# Patient Record
Sex: Female | Born: 1950
Health system: Southern US, Community
[De-identification: ages and names within clinical notes are randomized; demographics above are authoritative.]

## PROBLEM LIST (undated history)

## (undated) DIAGNOSIS — R51 Headache: Secondary | ICD-10-CM

## (undated) DIAGNOSIS — I872 Venous insufficiency (chronic) (peripheral): Secondary | ICD-10-CM

## (undated) DIAGNOSIS — C801 Malignant (primary) neoplasm, unspecified: Secondary | ICD-10-CM

## (undated) DIAGNOSIS — E039 Hypothyroidism, unspecified: Secondary | ICD-10-CM

## (undated) DIAGNOSIS — G4730 Sleep apnea, unspecified: Secondary | ICD-10-CM

## (undated) DIAGNOSIS — K5900 Constipation, unspecified: Secondary | ICD-10-CM

## (undated) DIAGNOSIS — G43909 Migraine, unspecified, not intractable, without status migrainosus: Secondary | ICD-10-CM

## (undated) HISTORY — DX: Constipation, unspecified: K59.00

## (undated) HISTORY — PX: WISDOM TOOTH EXTRACTION: SHX21

## (undated) HISTORY — DX: Sleep apnea, unspecified: G47.30

## (undated) HISTORY — PX: TUBAL LIGATION: SHX77

## (undated) HISTORY — PX: APPENDECTOMY: SHX54

## (undated) HISTORY — PX: KNEE ARTHROSCOPY: SUR90

## (undated) HISTORY — DX: Migraine, unspecified, not intractable, without status migrainosus: G43.909

## (undated) HISTORY — PX: ABDOMINAL HYSTERECTOMY: SHX81

---

## 1999-03-11 ENCOUNTER — Other Ambulatory Visit: Admission: RE | Admit: 1999-03-11 | Discharge: 1999-03-11 | Payer: Self-pay | Admitting: Gynecology

## 2000-03-21 ENCOUNTER — Other Ambulatory Visit: Admission: RE | Admit: 2000-03-21 | Discharge: 2000-03-21 | Payer: Self-pay | Admitting: Gynecology

## 2000-09-19 ENCOUNTER — Other Ambulatory Visit: Admission: RE | Admit: 2000-09-19 | Discharge: 2000-09-19 | Payer: Self-pay | Admitting: Gynecology

## 2001-03-20 ENCOUNTER — Other Ambulatory Visit: Admission: RE | Admit: 2001-03-20 | Discharge: 2001-03-20 | Payer: Self-pay | Admitting: Gynecology

## 2001-11-01 ENCOUNTER — Other Ambulatory Visit: Admission: RE | Admit: 2001-11-01 | Discharge: 2001-11-01 | Payer: Self-pay | Admitting: *Deleted

## 2004-03-12 ENCOUNTER — Other Ambulatory Visit: Admission: RE | Admit: 2004-03-12 | Discharge: 2004-03-12 | Payer: Self-pay | Admitting: Obstetrics and Gynecology

## 2005-03-15 ENCOUNTER — Other Ambulatory Visit: Admission: RE | Admit: 2005-03-15 | Discharge: 2005-03-15 | Payer: Self-pay | Admitting: Obstetrics and Gynecology

## 2007-05-10 ENCOUNTER — Encounter (INDEPENDENT_AMBULATORY_CARE_PROVIDER_SITE_OTHER): Payer: Self-pay | Admitting: General Surgery

## 2007-05-10 ENCOUNTER — Observation Stay (HOSPITAL_COMMUNITY): Admission: EM | Admit: 2007-05-10 | Discharge: 2007-05-10 | Payer: Self-pay | Admitting: Emergency Medicine

## 2010-11-22 ENCOUNTER — Inpatient Hospital Stay (INDEPENDENT_AMBULATORY_CARE_PROVIDER_SITE_OTHER)
Admission: RE | Admit: 2010-11-22 | Discharge: 2010-11-22 | Disposition: A | Discharge status: Home / Self Care | Payer: BC Managed Care – PPO | Source: Ambulatory Visit | Attending: Family Medicine | Admitting: Family Medicine

## 2010-11-22 DIAGNOSIS — M549 Dorsalgia, unspecified: Secondary | ICD-10-CM

## 2010-11-22 LAB — POCT URINALYSIS DIPSTICK
Bilirubin Urine: NEGATIVE
Nitrite: NEGATIVE
Protein, ur: NEGATIVE mg/dL
pH: 6 (ref 5.0–8.0)

## 2011-02-22 NOTE — Op Note (Signed)
NAMECHRISTOL, Mandy Holmes             ACCOUNT NO.:  [id number]   MEDICAL RECORD NO.:  [id number]          PATIENT TYPE:  OBV   LOCATION:  0005                         FACILITY:  [facility] [place]   PHYSICIAN:  Angelia Mould. Derrell Lolling, M.D.DATE OF BIRTH:  05-May-1951   DATE OF PROCEDURE:  05/10/2007  DATE OF DISCHARGE:                               OPERATIVE REPORT   PREOPERATIVE DIAGNOSIS:  Acute appendicitis.   POSTOPERATIVE DIAGNOSIS:  Acute appendicitis.   OPERATION PERFORMED:  Laparoscopic appendectomy.   SURGEON:  Angelia Mould. Derrell Lolling, M.D.   OPERATIVE INDICATIONS:  This is a 60 year old white female in reasonably  good health.  She presented with a 15-hour history of progressive right  lower quadrant pain, nausea and vomiting.  Examination reveals localized  tenderness in the right lower quadrant.  CT scan shows an inflamed  appendix, but no abscess.  She is brought to the operating room  emergently.   OPERATIVE FINDINGS:  The appendix was acutely inflamed, and there was  some exudate on the appendix, but there was no sign of any gangrene or  rupture.  The terminal ileum, cecum, and right colon looked normal.   OPERATIVE TECHNIQUE:  Following the induction of general endotracheal  anesthesia.  The patient's abdomen was prepped and draped in a sterile  fashion.  The patient was identified as the correct patient, correct  procedure, and intravenous antibiotics were given prior to the incision.  Then 0.5% Marcaine with epinephrine was used as a local infiltration  anesthetic.  An 11-mm OptiVu port was placed in the left rectus sheath  above the umbilicus.  This was atraumatic.  '   Pneumoperitoneum was created.  A 5-mm trocar was placed in the left  rectus sheath below the umbilicus; and an 11-mm trocar was placed in the  suprapubic area.  We ran the small bowel until we could find the  ileocecal valve.  We identified the appendix and elevated it.  We used  the harmonic scalpel to slowly take down  and divide the appendiceal  mesentery, and the appendiceal artery.  Once we had appendix completely  skeletonized.  We could see where the appendix inserted into the cecum.  At that point, the appendix was healthy.  We transected the appendix  with an Endo-GIA stapling device, placed it in a specimen bag and  removed it.   We irrigated the operative field.  There had been a little bit of oozing  from the area around the staple line that stopped; and the irrigation  fluid was completely clear.  The staple line appeared secure.  Pneumoperitoneum was  released.  Trocars were removed.  Skin incisions were closed with  subcuticular sutures of 4-0 Monocryl and Steri-Strips.  Clean bandages  were placed; the patient taken to the recovery in stable condition.  Estimated blood loss was about 20 mL.  Complications none.  Sponge,  needle and instruments counts were correct.      Angelia Mould. Derrell Lolling, M.D.  Electronically Signed     HMI/MEDQ  D:  05/10/2007  T:  05/10/2007  Job:  [id number]

## 2011-02-22 NOTE — H&P (Signed)
Mandy Holmes, Mandy Holmes             ACCOUNT NO.:  [id number]   MEDICAL RECORD NO.:  [id number]          PATIENT TYPE:  OBV   LOCATION:  0005                         FACILITY:  [facility]   PHYSICIAN:  Angelia Mould. Derrell Lolling, M.D.DATE OF BIRTH:  Dec 17, 1950   DATE OF ADMISSION:  05/10/2007  DATE OF DISCHARGE:                              HISTORY & PHYSICAL   CHIEF COMPLAINT:  Abdominal pain.   HISTORY OF PRESENT ILLNESS:  This is a 60 year old white female who  developed abdominal pain at noon yesterday, approximately 15 hours ago.  The pain has been progressive and is more localized to the right lower  quadrant now.  She has vomited several times.  Stools have been a little  softer but no diarrhea, no blood in the stools.  She denies fever or  chills.  She came to emergency room and a CT scan shows an inflamed  appendix but no evidence of rupture or abscess.  I was called to see  her.   PAST HISTORY:  Knee arthroscopies.  Bilateral tubal ligation.   MEDICATIONS:  Hormone-replacement therapy.   DRUG ALLERGIES:  None known.   FAMILY HISTORY:  Mother living, has had a hysterectomy for uterine  cancer, has had a cholecystectomy.  Father living, has diabetes.  Two  brothers, one with skin cancer.  Two sisters, one with diabetes.   SOCIAL HISTORY:  She is married.  They have two children.  Her husband  is with her tonight.  They live in [place].  Denies tobacco.  Drinks alcohol occasionally.  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   REVIEW OF SYSTEMS:  Fifteen-system review of systems is performed and is  noncontributory except as described above.   PHYSICAL EXAMINATION:  Pleasant middle-aged woman in mild distress.  Temperature 98.0, pulse 85, respirations 18, blood pressure 117/63.  HEENT:  Eyes:  Sclerae clear.  Extraocular movements intact.  Ears,  nose, mouth and throat, nose, lips, tongue and oropharynx are without  gross lesions.  NECK:   Supple, nontender.  No mass.  No jugular distension.  LUNGS:  Clear to auscultation.  No chest wall tenderness.  HEART:  Regular rate and rhythm.  No murmur.  Radial and femoral pulses  are palpable.  BREASTS:  Not examined.  ABDOMEN:  Borderline obese.  Soft.  Hypoactive bowel sounds.  She has  localized tenderness and significant involuntary guarding in the right  lower quadrant.  I do not feel a mass.  There are no scars or hernias.  EXTREMITIES:  She moves all four extremities well without pain or  deformity.  NEUROLOGIC:  No gross motor or sensory deficits.   ADMISSION DATA:  CT scan described above showing acute appendicitis.  White blood cell count 14,600; hemoglobin 12.8.   IMPRESSION:  Acute appendicitis.   PLAN:  The patient will need to be taken to operating room for a  laparoscopic appendectomy.   I have discussed the indication and details of surgery with the patient  and her husband.  Risks and complications have been outlined, including  but not limited to  bleeding, infection, conversion to open laparotomy,  injury to adjacent organs such as major vascular structure or the  intestine with major reconstructive surgery, differential diagnosis  discussed.  Cardiac, pulmonary and thromboembolic complications have  also been discussed.  She understands these issues well.  At this time  all of her questions are answered.  She is in complete agreement with  this plan.      Angelia Mould. Derrell Lolling, M.D.  Electronically Signed     HMI/MEDQ  D:  05/10/2007  T:  05/10/2007  Job:  [id number]

## 2011-03-31 ENCOUNTER — Other Ambulatory Visit: Payer: Self-pay | Admitting: Gynecology

## 2011-07-25 LAB — COMPREHENSIVE METABOLIC PANEL
ALT: 24
AST: 21
Alkaline Phosphatase: 75
CO2: 25
GFR calc Af Amer: >60
GFR calc non Af Amer: >60
Glucose, Bld: 157 — ABNORMAL HIGH
Potassium: 3.9
Sodium: 135

## 2011-07-25 LAB — CBC
Hemoglobin: 12.8
RBC: 4.14
WBC: 14.6 — ABNORMAL HIGH

## 2011-07-25 LAB — BUN: BUN: 11

## 2011-07-25 LAB — DIFFERENTIAL
Basophils Relative: 0
Eosinophils Absolute: 0
Eosinophils Relative: 0
Monocytes Relative: 5
Neutrophils Relative %: 91 — ABNORMAL HIGH

## 2011-07-25 LAB — CREATININE, SERUM: Creatinine, Ser: 0.53

## 2011-07-25 LAB — URINALYSIS, ROUTINE W REFLEX MICROSCOPIC
Hgb urine dipstick: NEGATIVE
Ketones, ur: NEGATIVE
Protein, ur: NEGATIVE
Urobilinogen, UA: 0.2

## 2011-08-01 ENCOUNTER — Inpatient Hospital Stay (INDEPENDENT_AMBULATORY_CARE_PROVIDER_SITE_OTHER)
Admission: RE | Admit: 2011-08-01 | Discharge: 2011-08-01 | Disposition: A | Discharge status: Home / Self Care | Payer: 59 | Source: Ambulatory Visit | Attending: Emergency Medicine | Admitting: Emergency Medicine

## 2011-08-01 DIAGNOSIS — J019 Acute sinusitis, unspecified: Secondary | ICD-10-CM

## 2011-12-28 ENCOUNTER — Other Ambulatory Visit: Payer: Self-pay | Admitting: Family Medicine

## 2011-12-28 DIAGNOSIS — E041 Nontoxic single thyroid nodule: Secondary | ICD-10-CM

## 2011-12-29 ENCOUNTER — Ambulatory Visit
Admission: RE | Admit: 2011-12-29 | Discharge: 2011-12-29 | Disposition: A | Discharge status: Home / Self Care | Payer: 59 | Source: Ambulatory Visit | Attending: Family Medicine | Admitting: Family Medicine

## 2011-12-29 DIAGNOSIS — E041 Nontoxic single thyroid nodule: Secondary | ICD-10-CM

## 2012-01-04 ENCOUNTER — Other Ambulatory Visit: Payer: Self-pay | Admitting: Family Medicine

## 2012-01-04 DIAGNOSIS — E041 Nontoxic single thyroid nodule: Secondary | ICD-10-CM

## 2012-01-10 ENCOUNTER — Other Ambulatory Visit: Payer: 59

## 2012-01-11 ENCOUNTER — Other Ambulatory Visit (HOSPITAL_COMMUNITY)
Admission: RE | Admit: 2012-01-11 | Discharge: 2012-01-11 | Disposition: A | Discharge status: Home / Self Care | Payer: 59 | Source: Ambulatory Visit | Attending: Interventional Radiology | Admitting: Interventional Radiology

## 2012-01-11 ENCOUNTER — Ambulatory Visit
Admission: RE | Admit: 2012-01-11 | Discharge: 2012-01-11 | Disposition: A | Discharge status: Home / Self Care | Payer: 59 | Source: Ambulatory Visit | Attending: Family Medicine | Admitting: Family Medicine

## 2012-01-11 DIAGNOSIS — E049 Nontoxic goiter, unspecified: Secondary | ICD-10-CM | POA: Insufficient documentation

## 2012-01-11 DIAGNOSIS — E041 Nontoxic single thyroid nodule: Secondary | ICD-10-CM

## 2013-04-10 ENCOUNTER — Other Ambulatory Visit: Payer: Self-pay | Admitting: Obstetrics and Gynecology

## 2013-07-03 ENCOUNTER — Other Ambulatory Visit: Payer: Self-pay | Admitting: Family Medicine

## 2013-07-03 DIAGNOSIS — E041 Nontoxic single thyroid nodule: Secondary | ICD-10-CM

## 2013-07-08 ENCOUNTER — Other Ambulatory Visit: Payer: Self-pay

## 2013-07-08 DIAGNOSIS — Z1231 Encounter for screening mammogram for malignant neoplasm of breast: Secondary | ICD-10-CM

## 2013-07-12 ENCOUNTER — Ambulatory Visit
Admission: RE | Admit: 2013-07-12 | Discharge: 2013-07-12 | Disposition: A | Discharge status: Home / Self Care | Payer: 59 | Source: Ambulatory Visit

## 2013-07-12 DIAGNOSIS — Z1231 Encounter for screening mammogram for malignant neoplasm of breast: Secondary | ICD-10-CM

## 2013-10-23 ENCOUNTER — Telehealth: Payer: Self-pay | Admitting: *Deleted

## 2013-10-23 NOTE — Telephone Encounter (Signed)
Call from Dr. Gus Height with pt referral. Requested pt to be seen at next available appt. Appt given for pt to be seen by Dr. Skeet Latch on 11/07/13 at [place]. Call to HIM for pt records to be faxed.

## 2013-11-07 ENCOUNTER — Ambulatory Visit: Payer: 59 | Attending: Gynecologic Oncology | Admitting: Gynecologic Oncology

## 2013-11-07 ENCOUNTER — Encounter (INDEPENDENT_AMBULATORY_CARE_PROVIDER_SITE_OTHER): Payer: Self-pay

## 2013-11-07 VITALS — BP 123/91 | HR 83 | Temp 98.6°F | Resp 16 | Ht 65.0 in | Wt 182.7 lb

## 2013-11-07 DIAGNOSIS — Z7982 Long term (current) use of aspirin: Secondary | ICD-10-CM | POA: Insufficient documentation

## 2013-11-07 DIAGNOSIS — Z8542 Personal history of malignant neoplasm of other parts of uterus: Secondary | ICD-10-CM | POA: Insufficient documentation

## 2013-11-07 DIAGNOSIS — E041 Nontoxic single thyroid nodule: Secondary | ICD-10-CM | POA: Insufficient documentation

## 2013-11-07 DIAGNOSIS — G43909 Migraine, unspecified, not intractable, without status migrainosus: Secondary | ICD-10-CM | POA: Insufficient documentation

## 2013-11-07 DIAGNOSIS — Z79899 Other long term (current) drug therapy: Secondary | ICD-10-CM | POA: Insufficient documentation

## 2013-11-07 DIAGNOSIS — C549 Malignant neoplasm of corpus uteri, unspecified: Secondary | ICD-10-CM | POA: Insufficient documentation

## 2013-11-07 DIAGNOSIS — C55 Malignant neoplasm of uterus, part unspecified: Secondary | ICD-10-CM

## 2013-11-07 DIAGNOSIS — C541 Malignant neoplasm of endometrium: Secondary | ICD-10-CM

## 2013-11-07 MED ORDER — HYDROCODONE-ACETAMINOPHEN 5-325 MG PO TABS: 1.0000 | ORAL_TABLET | Freq: Four times a day (QID) | ORAL | Status: DC | PRN

## 2013-11-07 NOTE — Progress Notes (Signed)
Pt advised she has between 13-15 headaches a month and takes a BC powder 845 mg when she feels a headache coming on. Discussed with pt she will need to refrain from taking BC powder or blood thinners 10days prior to surgery on 2/24. NP spoke to pt,reviewed effects of taking blood thinners prior to surgery.Pt verbalized understanding Rx for Pt plans to have surgery on 2/24 at [facility]

## 2013-11-07 NOTE — Progress Notes (Signed)
Consult Note: Gyn-Onc  Consult was requested by Dr. Harrington Holmes for the evaluation of Mandy Holmes 63 y.o. female  CC:  Chief Complaint  Patient presents with  . endometrial cancer    Assessment/Plan:  [name]  is a 63 y.o. with FIGO grade 1 endometrial adenocarcinoma.  A detailed discussion was held with the patient with regard to to her endometrial cancer diagnosis. We discussed the standard management options for uterine cancer which includes surgery followed possibly by adjuvant therapy depending on the results of surgery. The options for surgical management include a hysterectomy and removal of the tubes and ovaries possibly with removal of pelvic and para-aortic lymph nodes. A minimally invasive approach including a robotic hysterectomy or laparoscopic hysterectomy have benefits including shorter hospital stay, recovery time and better wound healing. The alternative approach is an open hysterectomy. The patient has been counseled about these surgical options and the risks of surgery in general including infection, bleeding, damage to surrounding structures (including bowel, bladder, ureters, nerves or vessels).   I extensively reviewed the additional risks of robotic hysterectomy including possible need for conversion to open laparotomy.  I discussed positioning during surgery of trendelenberg and risks of minor facial swelling and care we take in preoperative positioning.  After counseling and consideration of her options, she desires to proceed with a minimally invasive endometrial cancer staging procedure on December 03, 2013 with Dr. Nancy Holmes.  She has been advised to discontinue the use of BC powder 10 days prior to surgery.  She will be seen by anesthesia for preoperative clearance and discussion of postoperative pain management.  She was given the opportunity to ask questions, which were answered to her satisfaction, and she is agreement with the above mentioned plan of  care.  HPI: [name] is a 63 y.o. LNMP 10 years he has been on Vivelle-Dot and progesterone for hormonal therapy.  She reports bleeding 01/2012.  Evaluation at that time was notable for benign weakly proliferative endometrium no atypia or hyperplasia.  Mandy Holmes did well until 9 months ago when she noted irregular bleeding.  She presented for evaluation and an endometrial biopsy  10/22/2013 was notable for endometrial adenocarcinoma FIGO grade 1 and a benign endocervical polyp.  The uterus sounded to 7 cm.  Mandy Holmes reports less bleeding since the procedure.  She denies weight loss is no nausea vomiting no changes in bowel or bladder habits no abdominal distention.  Her family history is notable for mother diagnosed with uterine  cancer at age 7 and a brother with melanoma.     Current Meds:  Outpatient Encounter Prescriptions as of 11/07/2013  Medication Sig  . Aspirin-Salicylamide-Caffeine (BC HEADACHE POWDER PO) Take by mouth.  Marland Kitchen BIOTIN 5000 PO Take 10,000 mcg by mouth.  . calcium carbonate (TUMS - DOSED IN MG ELEMENTAL CALCIUM) 500 MG chewable tablet Chew 1 tablet by mouth daily.  . cyanocobalamin 100 MCG tablet Take 1,000 mcg by mouth daily.  Marland Kitchen EVENING PRIMROSE OIL PO Take 1 each by mouth daily.  . IODINE, KELP, PO Take by mouth.  Marland Kitchen MAGNESIUM GLYCINATE PLUS PO Take 1 each by mouth daily.  . Multiple Vitamin (MULTIVITAMIN) tablet Take 1 tablet by mouth daily.  . Omega-3 Fatty Acids (FISH OIL PO) Take by mouth.  Marland Kitchen PROGESTERONE IM Inject into the muscle. 1 x week  . vitamin C (ASCORBIC ACID) 500 MG tablet Take 3,000 mg by mouth daily.  . Vitamin D, Ergocalciferol, (DRISDOL)  50000 UNITS CAPS capsule Take 50,000 Units by mouth every 7 (seven) days.  Marland Kitchen HYDROcodone-acetaminophen (NORCO/VICODIN) 5-325 MG per tablet Take 1 tablet by mouth every 6 (six) hours as needed for moderate pain.    Allergy: No Known Allergies  Social Hx:   History   Social History  . Marital  Status: Married    Spouse Name: N/A    Number of Children: N/A  . Years of Education: N/A   Occupational History  . Not on file.   Social History Main Topics  . Smoking status: Not on file  . Smokeless tobacco: Not on file  . Alcohol Use: Not on file  . Drug Use: Not on file  . Sexual Activity: Not on file   Other Topics Concern  . Not on file   Social History Narrative  . No narrative on file    Past Surgical Hx: No past surgical history on file.  Past Medical Hx: No past medical history on file. Migraines  Cold thyroid nodule  Past Gynecological History:Menarche 11/regular/3days LNMP 10 years ago.  HT since menopause  Vivelle dot and progesteronex5/week. No h/o abnormal pap.  Tubal 1981  Family Hx: No family history on file. Mother uterine cancer dx 84 Brother melanoma  Review of Systems:  Constitutional  Feels well,   Cardiovascular  No chest pain, shortness of breath, or edema  Pulmonary  No cough or wheeze.  Gastro Intestinal  No nausea, vomitting, or diarrhoea. No bright red blood per rectum, no abdominal pain, change in bowel movement, or constipation. No weight loss, no nausea vomiting or early satiety. Genito Urinary  No frequency, urgency, dysuria, intermittent spotting. Musculo Skeletal  No myalgia, arthralgia, joint swelling or pain  Neurologic  No weakness, numbness, change in gait,  Psychology  No depression, anxiety, insomnia.   Vitals:  Blood pressure 123/91, pulse 83, temperature 98.6 F (37 C), temperature source Oral, resp. rate 16, height 5\' 5"  (1.651 m), weight 182 lb 11.2 oz (82.872 kg). Body mass index is 30.4 kg/(m^2).  Physical Exam: WD in NAD Neck  Supple NROM, without any enlargements.  Lymph Node Survey No cervical supraclavicular or inguinal adenopathy Cardiovascular  Pulse normal rate, regularity and rhythm. S1 and S2 normal.  Lungs  Clear to auscultation bilateraly,  Good air movement.  Psychiatry  Alert and oriented to  person, place, and time good mood, appropriate affect speech and judgement Abdomen  Normoactive bowel sounds, abdomen soft, non-tender and obese. Umbilical incision intact without hernia Back No CVA tenderness Genito Urinary  Vulva/vagina: Normal external female genitalia.   Bladder/urethra:  No lesions or masses  Vagina: no blood or discharge in the vaginal vault.  Cervix: Normal appearing, no lesions.  Uterus: Unable to assess size, mobile, no parametrial involvement or nodularity.  Adnexa: unable to assess, no cul de sac nodularity Rectal  Good tone, no masses no cul de sac nodularity.  Extremities  No bilateral cyanosis, clubbing or edema.   Janie Morning, MD, PhD 11/07/2013, 1:29 PM

## 2013-11-07 NOTE — Patient Instructions (Addendum)
Plan for surgery on 12/03/13 with Nancy Marus at [place] will receive a phone call from pre-operative testing at [facility].  Please call for any questions or concerns.  Blood Transfusion Information WHAT IS A BLOOD TRANSFUSION? A transfusion is the replacement of blood or some of its parts. Blood is made up of multiple cells which provide different functions.  Red blood cells carry oxygen and are used for blood loss replacement.  White blood cells fight against infection.  Platelets control bleeding.  Plasma helps clot blood.  Other blood products are available for specialized needs, such as hemophilia or other clotting disorders. BEFORE THE TRANSFUSION  Who gives blood for transfusions?   You may be able to donate blood to be used at a later date on yourself (autologous donation).  Relatives can be asked to donate blood. This is generally not any safer than if you have received blood from a stranger. The same precautions are taken to ensure safety when a relative's blood is donated.  Healthy volunteers who are fully evaluated to make sure their blood is safe. This is blood bank blood. Transfusion therapy is the safest it has ever been in the practice of medicine. Before blood is taken from a donor, a complete history is taken to make sure that person has no history of diseases nor engages in risky social behavior (examples are intravenous drug use or sexual activity with multiple partners). The donor's travel history is screened to minimize risk of transmitting infections, such as malaria. The donated blood is tested for signs of infectious diseases, such as HIV and hepatitis. The blood is then tested to be sure it is compatible with you in order to minimize the chance of a transfusion reaction. If you or a relative donates blood, this is often done in anticipation of surgery and is not appropriate for emergency situations. It takes many days to process the  donated blood. RISKS AND COMPLICATIONS Although transfusion therapy is very safe and saves many lives, the main dangers of transfusion include:   Getting an infectious disease.  Developing a transfusion reaction. This is an allergic reaction to something in the blood you were given. Every precaution is taken to prevent this. The decision to have a blood transfusion has been considered carefully by your caregiver before blood is given. Blood is not given unless the benefits outweigh the risks.

## 2013-11-22 ENCOUNTER — Encounter (HOSPITAL_COMMUNITY): Payer: Self-pay | Admitting: Pharmacy Technician

## 2013-11-28 NOTE — Patient Instructions (Addendum)
      Your procedure is scheduled on:   12/03/13 TUESDAY  Report to [facility] at  [place]     AM.  Call this number if you have problems the morning of surgery: [phone]        Do not   Eat :After Midnight. Sunday NIGHT.  BEGIN CLEAR LIQUIDS ONLY Monday-- MAY DRINK UNTIL BEDTIME OR MIDNIGHT Monday NIGHT-- THEN NOTHING BY MOUTH   Take these medicines the morning of surgery with A SIP OF WATER: Thyroid([place])   .  Contacts, dentures or partial plates, or metal hairpins  can not be worn to surgery. Your family will be responsible for glasses, dentures, hearing aides while you are in surgery  Leave suitcase in the car. After surgery it may be brought to your room.  For patients admitted to the hospital, checkout time is 11:00 AM day of  discharge.                DO NOT WEAR JEWELRY, LOTIONS, POWDERS, OR PERFUMES.  WOMEN-- DO NOT SHAVE LEGS OR UNDERARMS FOR 48 HOURS BEFORE SHOWERS. MEN MAY SHAVE FACE.  Patients discharged the day of surgery will not be allowed to drive home. IF going home the day of surgery, you must have a driver and someone to stay with you for the first 24 hours  Name and phone number of your driver:     [email or web address]                                                                   Please read over the following fact sheets that you were given: Incentive Spirometry Sheet, Blood Transfusion Sheet  Information                     FAILURE TO [place]                                                  Patient Signature _____________________________

## 2013-11-29 ENCOUNTER — Encounter (HOSPITAL_COMMUNITY): Payer: Self-pay

## 2013-11-29 ENCOUNTER — Ambulatory Visit (HOSPITAL_COMMUNITY)
Admission: RE | Admit: 2013-11-29 | Discharge: 2013-11-29 | Disposition: A | Discharge status: Home / Self Care | Payer: 59 | Source: Ambulatory Visit | Attending: Gynecologic Oncology | Admitting: Gynecologic Oncology

## 2013-11-29 ENCOUNTER — Encounter (HOSPITAL_COMMUNITY)
Admission: RE | Admit: 2013-11-29 | Discharge: 2013-11-29 | Disposition: A | Discharge status: Home / Self Care | Payer: 59 | Source: Ambulatory Visit | Attending: Obstetrics & Gynecology | Admitting: Obstetrics & Gynecology

## 2013-11-29 DIAGNOSIS — Z01812 Encounter for preprocedural laboratory examination: Secondary | ICD-10-CM | POA: Insufficient documentation

## 2013-11-29 DIAGNOSIS — Z01818 Encounter for other preprocedural examination: Secondary | ICD-10-CM | POA: Insufficient documentation

## 2013-11-29 HISTORY — DX: Malignant (primary) neoplasm, unspecified: C80.1

## 2013-11-29 HISTORY — DX: Venous insufficiency (chronic) (peripheral): I87.2

## 2013-11-29 HISTORY — DX: Hypothyroidism, unspecified: E03.9

## 2013-11-29 HISTORY — DX: Headache: R51

## 2013-11-29 LAB — CBC WITH DIFFERENTIAL/PLATELET
Basophils Absolute: 0 10*3/uL (ref 0.0–0.1)
Basophils Relative: 1 % (ref 0–1)
EOS ABS: 0.2 10*3/uL (ref 0.0–0.7)
EOS PCT: 3 % (ref 0–5)
HEMATOCRIT: 43.7 % (ref 36.0–46.0)
HEMOGLOBIN: 14.4 g/dL (ref 12.0–15.0)
LYMPHS ABS: 1.6 10*3/uL (ref 0.7–4.0)
Lymphocytes Relative: 24 % (ref 12–46)
MCH: 30.1 pg (ref 26.0–34.0)
MCHC: 33 g/dL (ref 30.0–36.0)
MCV: 91.2 fL (ref 78.0–100.0)
MONO ABS: 0.5 10*3/uL (ref 0.1–1.0)
MONOS PCT: 8 % (ref 3–12)
Neutro Abs: 4.4 10*3/uL (ref 1.7–7.7)
Neutrophils Relative %: 65 % (ref 43–77)
Platelets: 331 10*3/uL (ref 150–400)
RBC: 4.79 MIL/uL (ref 3.87–5.11)
RDW: 12.9 % (ref 11.5–15.5)
WBC: 6.8 10*3/uL (ref 4.0–10.5)

## 2013-11-29 LAB — COMPREHENSIVE METABOLIC PANEL
ALT: 29 U/L (ref 0–35)
AST: 19 U/L (ref 0–37)
Albumin: 4.6 g/dL (ref 3.5–5.2)
Alkaline Phosphatase: 91 U/L (ref 39–117)
BUN: 16 mg/dL (ref 6–23)
CALCIUM: 10.7 mg/dL — AB (ref 8.4–10.5)
CO2: 27 mEq/L (ref 19–32)
CREATININE: 0.66 mg/dL (ref 0.50–1.10)
Chloride: 100 mEq/L (ref 96–112)
GFR calc non Af Amer: >90 mL/min (ref >90)
GLUCOSE: 105 mg/dL — AB (ref 70–99)
Potassium: 4.5 mEq/L (ref 3.7–5.3)
Sodium: 140 mEq/L (ref 137–147)
TOTAL PROTEIN: 7.7 g/dL (ref 6.0–8.3)
Total Bilirubin: <0.2 mg/dL — ABNORMAL LOW (ref 0.3–1.2)

## 2013-11-29 LAB — URINE MICROSCOPIC-ADD ON

## 2013-11-29 LAB — URINALYSIS, ROUTINE W REFLEX MICROSCOPIC
Bilirubin Urine: NEGATIVE
GLUCOSE, UA: NEGATIVE mg/dL
Hgb urine dipstick: NEGATIVE
KETONES UR: NEGATIVE mg/dL
Nitrite: NEGATIVE
PROTEIN: NEGATIVE mg/dL
Specific Gravity, Urine: 1.019 (ref 1.005–1.030)
Urobilinogen, UA: 0.2 mg/dL (ref 0.0–1.0)
pH: 7 (ref 5.0–8.0)

## 2013-11-29 LAB — ABO/RH: ABO/RH(D): O NEG

## 2013-11-29 NOTE — Progress Notes (Signed)
Faxed u/a with micro to Heywood Iles NP via [facility]

## 2013-11-29 NOTE — Progress Notes (Signed)
Notified Abigail Butts in lab of added urine culture

## 2013-11-30 LAB — URINE CULTURE
Colony Count: 4000
Special Requests: NORMAL

## 2013-12-02 ENCOUNTER — Telehealth: Payer: Self-pay | Admitting: *Deleted

## 2013-12-02 NOTE — Telephone Encounter (Signed)
Call to pt with reminder for clear liquids today and NPO after midnight.

## 2013-12-02 NOTE — Anesthesia Preprocedure Evaluation (Addendum)
Anesthesia Evaluation  Patient identified by MRN, date of birth, ID band Patient awake    Reviewed: Allergy & Precautions, H&P , NPO status , Patient's Chart, lab work & pertinent test results  Airway Mallampati: II TM Distance: >3 FB Neck ROM: Full    Dental  (+) Teeth Intact, Dental Advisory Given, Caps   Pulmonary former smoker,  breath sounds clear to auscultation  Pulmonary exam normal       Cardiovascular + Peripheral Vascular Disease negative cardio ROS  Rhythm:Regular Rate:Normal     Neuro/Psych  Headaches, negative psych ROS   GI/Hepatic negative GI ROS, Neg liver ROS,   Endo/Other  Hypothyroidism   Renal/GU negative Renal ROS  negative genitourinary   Musculoskeletal negative musculoskeletal ROS (+)   Abdominal   Peds negative pediatric ROS (+)  Hematology negative hematology ROS (+)   Anesthesia Other Findings   Reproductive/Obstetrics                          Anesthesia Physical Anesthesia Plan  ASA: II  Anesthesia Plan: General   Post-op Pain Management:    Induction: Intravenous  Airway Management Planned: Oral ETT  Additional Equipment:   Intra-op Plan:   Post-operative Plan: Extubation in OR  Informed Consent: I have reviewed the patients History and Physical, chart, labs and discussed the procedure including the risks, benefits and alternatives for the proposed anesthesia with the patient or authorized representative who has indicated his/her understanding and acceptance.   Dental advisory given  Plan Discussed with: CRNA  Anesthesia Plan Comments:        Anesthesia Quick Evaluation

## 2013-12-03 ENCOUNTER — Encounter (HOSPITAL_COMMUNITY): Payer: 59 | Admitting: Anesthesiology

## 2013-12-03 ENCOUNTER — Encounter (HOSPITAL_COMMUNITY)
Admission: RE | Disposition: A | Discharge status: Home / Self Care | Payer: Self-pay | Source: Ambulatory Visit | Attending: Obstetrics & Gynecology

## 2013-12-03 ENCOUNTER — Inpatient Hospital Stay (HOSPITAL_COMMUNITY): Payer: 59 | Admitting: Anesthesiology

## 2013-12-03 ENCOUNTER — Ambulatory Visit (HOSPITAL_COMMUNITY)
Admission: RE | Admit: 2013-12-03 | Discharge: 2013-12-04 | Disposition: A | Discharge status: Home / Self Care | Payer: 59 | Source: Ambulatory Visit | Attending: Obstetrics & Gynecology | Admitting: Obstetrics & Gynecology

## 2013-12-03 ENCOUNTER — Encounter (HOSPITAL_COMMUNITY): Payer: Self-pay

## 2013-12-03 DIAGNOSIS — C55 Malignant neoplasm of uterus, part unspecified: Secondary | ICD-10-CM

## 2013-12-03 DIAGNOSIS — I739 Peripheral vascular disease, unspecified: Secondary | ICD-10-CM | POA: Insufficient documentation

## 2013-12-03 DIAGNOSIS — Z79899 Other long term (current) drug therapy: Secondary | ICD-10-CM | POA: Insufficient documentation

## 2013-12-03 DIAGNOSIS — C549 Malignant neoplasm of corpus uteri, unspecified: Principal | ICD-10-CM | POA: Insufficient documentation

## 2013-12-03 DIAGNOSIS — E039 Hypothyroidism, unspecified: Secondary | ICD-10-CM | POA: Insufficient documentation

## 2013-12-03 DIAGNOSIS — C541 Malignant neoplasm of endometrium: Secondary | ICD-10-CM | POA: Diagnosis present

## 2013-12-03 HISTORY — PX: ROBOTIC ASSISTED TOTAL HYSTERECTOMY WITH BILATERAL SALPINGO OOPHERECTOMY: SHX6086

## 2013-12-03 LAB — TYPE AND SCREEN
ABO/RH(D): O NEG
Antibody Screen: NEGATIVE

## 2013-12-03 SURGERY — ROBOTIC ASSISTED TOTAL HYSTERECTOMY WITH BILATERAL SALPINGO OOPHORECTOMY
Anesthesia: General

## 2013-12-03 MED ORDER — ONDANSETRON HCL 4 MG/2ML IJ SOLN
INTRAMUSCULAR | Status: DC | PRN
  Administered 2013-12-03 (×2): 2 mg via INTRAVENOUS

## 2013-12-03 MED ORDER — OXYCODONE-ACETAMINOPHEN 5-325 MG PO TABS: 1.0000 | ORAL_TABLET | ORAL | Status: DC | PRN

## 2013-12-03 MED ORDER — ATROPINE SULFATE 0.4 MG/ML IJ SOLN
INTRAMUSCULAR | Status: AC
  Filled 2013-12-03: qty 1

## 2013-12-03 MED ORDER — EPHEDRINE SULFATE 50 MG/ML IJ SOLN
INTRAMUSCULAR | Status: DC | PRN
  Administered 2013-12-03: 5 mg via INTRAVENOUS

## 2013-12-03 MED ORDER — MIDAZOLAM HCL 5 MG/5ML IJ SOLN
INTRAMUSCULAR | Status: DC | PRN
  Administered 2013-12-03 (×4): 1 mg via INTRAVENOUS

## 2013-12-03 MED ORDER — KETOROLAC TROMETHAMINE 15 MG/ML IJ SOLN
30.0000 mg | Freq: Four times a day (QID) | INTRAMUSCULAR | Status: AC
Start: 2013-12-03 — End: 2013-12-04
  Administered 2013-12-03 – 2013-12-04 (×4): 30 mg via INTRAVENOUS
  Filled 2013-12-03 (×4): qty 1
  Filled 2013-12-03 (×2): qty 2

## 2013-12-03 MED ORDER — LIP MEDEX EX OINT
TOPICAL_OINTMENT | CUTANEOUS | Status: AC
  Filled 2013-12-03: qty 7

## 2013-12-03 MED ORDER — PROPOFOL 10 MG/ML IV BOLUS
INTRAVENOUS | Status: AC
  Filled 2013-12-03: qty 20

## 2013-12-03 MED ORDER — FENTANYL CITRATE 0.05 MG/ML IJ SOLN
INTRAMUSCULAR | Status: AC
  Filled 2013-12-03: qty 5

## 2013-12-03 MED ORDER — KETOROLAC TROMETHAMINE 15 MG/ML IJ SOLN
30.0000 mg | Freq: Four times a day (QID) | INTRAMUSCULAR | Status: AC
  Filled 2013-12-03 (×4): qty 1

## 2013-12-03 MED ORDER — HYDROMORPHONE HCL PF 1 MG/ML IJ SOLN
INTRAMUSCULAR | Status: AC
  Filled 2013-12-03: qty 1

## 2013-12-03 MED ORDER — LACTATED RINGERS IV SOLN: INTRAVENOUS | Status: DC

## 2013-12-03 MED ORDER — PROPOFOL 10 MG/ML IV BOLUS
INTRAVENOUS | Status: DC | PRN
  Administered 2013-12-03: 175 mg via INTRAVENOUS

## 2013-12-03 MED ORDER — CEFAZOLIN SODIUM-DEXTROSE 2-3 GM-% IV SOLR
INTRAVENOUS | Status: AC
  Filled 2013-12-03: qty 50

## 2013-12-03 MED ORDER — FENTANYL CITRATE 0.05 MG/ML IJ SOLN
INTRAMUSCULAR | Status: DC | PRN
  Administered 2013-12-03 (×5): 50 ug via INTRAVENOUS
  Administered 2013-12-03: 100 ug via INTRAVENOUS
  Administered 2013-12-03: 50 ug via INTRAVENOUS
  Administered 2013-12-03: 100 ug via INTRAVENOUS

## 2013-12-03 MED ORDER — LACTATED RINGERS IR SOLN
Status: DC | PRN
  Administered 2013-12-03: 1000 mL

## 2013-12-03 MED ORDER — HEPARIN SODIUM (PORCINE) 1000 UNIT/ML IJ SOLN
INTRAMUSCULAR | Status: AC
  Filled 2013-12-03: qty 1

## 2013-12-03 MED ORDER — ONDANSETRON HCL 4 MG/2ML IJ SOLN: 4.0000 mg | Freq: Four times a day (QID) | INTRAMUSCULAR | Status: DC | PRN

## 2013-12-03 MED ORDER — LIDOCAINE HCL (CARDIAC) 20 MG/ML IV SOLN
INTRAVENOUS | Status: AC
  Filled 2013-12-03: qty 5

## 2013-12-03 MED ORDER — GLYCOPYRROLATE 0.2 MG/ML IJ SOLN
INTRAMUSCULAR | Status: AC
  Filled 2013-12-03: qty 3

## 2013-12-03 MED ORDER — HYDROMORPHONE HCL PF 1 MG/ML IJ SOLN
0.2500 mg | INTRAMUSCULAR | Status: DC | PRN
  Administered 2013-12-03: 0.5 mg via INTRAVENOUS

## 2013-12-03 MED ORDER — NEOSTIGMINE METHYLSULFATE 1 MG/ML IJ SOLN
INTRAMUSCULAR | Status: DC | PRN
  Administered 2013-12-03: 3 mg via INTRAVENOUS

## 2013-12-03 MED ORDER — LIDOCAINE HCL (CARDIAC) 20 MG/ML IV SOLN
INTRAVENOUS | Status: DC | PRN
  Administered 2013-12-03: 75 mg via INTRAVENOUS

## 2013-12-03 MED ORDER — ENOXAPARIN SODIUM 40 MG/0.4ML ~~LOC~~ SOLN
40.0000 mg | SUBCUTANEOUS | Status: DC
  Administered 2013-12-04: 40 mg via SUBCUTANEOUS
  Filled 2013-12-03 (×2): qty 0.4

## 2013-12-03 MED ORDER — ONDANSETRON HCL 4 MG/2ML IJ SOLN
INTRAMUSCULAR | Status: AC
  Filled 2013-12-03: qty 2

## 2013-12-03 MED ORDER — MIDAZOLAM HCL 2 MG/2ML IJ SOLN
INTRAMUSCULAR | Status: AC
  Filled 2013-12-03: qty 2

## 2013-12-03 MED ORDER — EPHEDRINE SULFATE 50 MG/ML IJ SOLN
INTRAMUSCULAR | Status: AC
  Filled 2013-12-03: qty 1

## 2013-12-03 MED ORDER — ENOXAPARIN SODIUM 40 MG/0.4ML ~~LOC~~ SOLN
40.0000 mg | SUBCUTANEOUS | Status: AC
  Administered 2013-12-03: 40 mg via SUBCUTANEOUS
  Filled 2013-12-03: qty 0.4

## 2013-12-03 MED ORDER — CEFAZOLIN SODIUM-DEXTROSE 2-3 GM-% IV SOLR
2.0000 g | INTRAVENOUS | Status: AC
  Administered 2013-12-03: 2 g via INTRAVENOUS

## 2013-12-03 MED ORDER — PROMETHAZINE HCL 25 MG/ML IJ SOLN
6.2500 mg | INTRAMUSCULAR | Status: DC | PRN
Start: 2013-12-03 — End: 2013-12-03

## 2013-12-03 MED ORDER — SODIUM CHLORIDE 0.9 % IJ SOLN
INTRAMUSCULAR | Status: AC
  Filled 2013-12-03: qty 10

## 2013-12-03 MED ORDER — STERILE WATER FOR IRRIGATION IR SOLN
Status: DC | PRN
  Administered 2013-12-03: 3000 mL

## 2013-12-03 MED ORDER — ACETAMINOPHEN 10 MG/ML IV SOLN
1000.0000 mg | INTRAVENOUS | Status: DC
  Filled 2013-12-03: qty 100

## 2013-12-03 MED ORDER — SUCCINYLCHOLINE CHLORIDE 20 MG/ML IJ SOLN
INTRAMUSCULAR | Status: DC | PRN
  Administered 2013-12-03: 90 mg via INTRAVENOUS

## 2013-12-03 MED ORDER — CISATRACURIUM BESYLATE 20 MG/10ML IV SOLN
INTRAVENOUS | Status: AC
Start: 2013-12-03 — End: 2013-12-03
  Filled 2013-12-03: qty 10

## 2013-12-03 MED ORDER — LACTATED RINGERS IV SOLN
INTRAVENOUS | Status: DC | PRN
  Administered 2013-12-03 (×2): via INTRAVENOUS

## 2013-12-03 MED ORDER — HYDROMORPHONE HCL PF 1 MG/ML IJ SOLN
0.5000 mg | INTRAMUSCULAR | Status: DC | PRN
  Administered 2013-12-03 – 2013-12-04 (×3): 0.5 mg via INTRAVENOUS
  Filled 2013-12-03 (×3): qty 1

## 2013-12-03 MED ORDER — CISATRACURIUM BESYLATE (PF) 10 MG/5ML IV SOLN
INTRAVENOUS | Status: DC | PRN
  Administered 2013-12-03: 4 mg via INTRAVENOUS
  Administered 2013-12-03: 2 mg via INTRAVENOUS
  Administered 2013-12-03: 8 mg via INTRAVENOUS

## 2013-12-03 MED ORDER — GLYCOPYRROLATE 0.2 MG/ML IJ SOLN
INTRAMUSCULAR | Status: DC | PRN
  Administered 2013-12-03: .6 mg via INTRAVENOUS

## 2013-12-03 MED ORDER — DEXAMETHASONE SODIUM PHOSPHATE 10 MG/ML IJ SOLN
INTRAMUSCULAR | Status: AC
  Filled 2013-12-03: qty 1

## 2013-12-03 MED ORDER — NEOSTIGMINE METHYLSULFATE 1 MG/ML IJ SOLN
INTRAMUSCULAR | Status: AC
  Filled 2013-12-03: qty 10

## 2013-12-03 MED ORDER — ACETAMINOPHEN 10 MG/ML IV SOLN
INTRAVENOUS | Status: DC | PRN
  Administered 2013-12-03: 1000 mg via INTRAVENOUS

## 2013-12-03 MED ORDER — DEXAMETHASONE SODIUM PHOSPHATE 10 MG/ML IJ SOLN
INTRAMUSCULAR | Status: DC | PRN
  Administered 2013-12-03: 10 mg via INTRAVENOUS

## 2013-12-03 MED ORDER — ONDANSETRON HCL 4 MG PO TABS: 4.0000 mg | ORAL_TABLET | Freq: Four times a day (QID) | ORAL | Status: DC | PRN

## 2013-12-03 MED ORDER — KCL IN DEXTROSE-NACL 20-5-0.45 MEQ/L-%-% IV SOLN
INTRAVENOUS | Status: DC
  Administered 2013-12-03 – 2013-12-04 (×2): via INTRAVENOUS
  Filled 2013-12-03 (×3): qty 1000

## 2013-12-03 MED ORDER — THYROID 60 MG PO TABS
60.0000 mg | ORAL_TABLET | Freq: Every day | ORAL | Status: DC
  Administered 2013-12-04: 60 mg via ORAL
  Filled 2013-12-03 (×2): qty 1

## 2013-12-03 SURGICAL SUPPLY — 61 items
BENZOIN TINCTURE PRP APPL 2/3 (GAUZE/BANDAGES/DRESSINGS) ×3 IMPLANT
CABLE HIGH FREQUENCY MONO STRZ (ELECTRODE) ×3 IMPLANT
CHLORAPREP W/TINT 26ML (MISCELLANEOUS) ×3 IMPLANT
CLOSURE WOUND 1/2 X4 (GAUZE/BANDAGES/DRESSINGS) ×1
CORDS BIPOLAR (ELECTRODE) ×3 IMPLANT
COVER MAYO STAND STRL (DRAPES) ×3 IMPLANT
COVER SURGICAL LIGHT HANDLE (MISCELLANEOUS) ×3 IMPLANT
COVER TIP SHEARS 8 DVNC (MISCELLANEOUS) ×1 IMPLANT
COVER TIP SHEARS 8MM DA VINCI (MISCELLANEOUS) ×2
DRAPE LG THREE QUARTER DISP (DRAPES) IMPLANT
DRAPE SURG IRRIG POUCH 19X23 (DRAPES) ×3 IMPLANT
DRAPE TABLE BACK 44X90 PK DISP (DRAPES) ×6 IMPLANT
DRAPE UTILITY XL STRL (DRAPES) IMPLANT
DRAPE WARM FLUID 44X44 (DRAPE) ×3 IMPLANT
DRSG TEGADERM 2-3/8X2-3/4 SM (GAUZE/BANDAGES/DRESSINGS) ×12 IMPLANT
DRSG TEGADERM 4X4.75 (GAUZE/BANDAGES/DRESSINGS) ×3 IMPLANT
DRSG TEGADERM 6X8 (GAUZE/BANDAGES/DRESSINGS) ×6 IMPLANT
ELECT REM PT RETURN 9FT ADLT (ELECTROSURGICAL) ×3
ELECTRODE REM PT RTRN 9FT ADLT (ELECTROSURGICAL) ×1 IMPLANT
GAUZE SPONGE 2X2 8PLY STRL LF (GAUZE/BANDAGES/DRESSINGS) IMPLANT
GLOVE BIO SURGEON STRL SZ 6.5 (GLOVE) ×12 IMPLANT
GLOVE BIO SURGEON STRL SZ7.5 (GLOVE) IMPLANT
GLOVE BIO SURGEONS STRL SZ 6.5 (GLOVE) ×6
GLOVE BIOGEL PI IND STRL 7.0 (GLOVE) ×3 IMPLANT
GLOVE BIOGEL PI INDICATOR 7.0 (GLOVE) ×6
GLOVE SS BIOGEL STRL SZ 7 (GLOVE) ×3 IMPLANT
GLOVE SUPERSENSE BIOGEL SZ 7 (GLOVE) ×6
GOWN STRL REUS W/ TWL LRG LVL4 (GOWN DISPOSABLE) ×2 IMPLANT
GOWN STRL REUS W/ TWL XL LVL3 (GOWN DISPOSABLE) IMPLANT
GOWN STRL REUS W/TWL LRG LVL3 (GOWN DISPOSABLE) ×12 IMPLANT
GOWN STRL REUS W/TWL LRG LVL4 (GOWN DISPOSABLE) ×4
GOWN STRL REUS W/TWL XL LVL3 (GOWN DISPOSABLE)
HOLDER FOLEY CATH W/STRAP (MISCELLANEOUS) ×3 IMPLANT
KIT ACCESSORY DA VINCI DISP (KITS) ×2
KIT ACCESSORY DVNC DISP (KITS) ×1 IMPLANT
KIT BASIN OR (CUSTOM PROCEDURE TRAY) ×3 IMPLANT
MANIPULATOR UTERINE 4.5 ZUMI (MISCELLANEOUS) ×3 IMPLANT
OCCLUDER COLPOPNEUMO (BALLOONS) ×3 IMPLANT
POUCH SPECIMEN RETRIEVAL 10MM (ENDOMECHANICALS) ×6 IMPLANT
SET TUBE IRRIG SUCTION NO TIP (IRRIGATION / IRRIGATOR) ×3 IMPLANT
SHEET LAVH (DRAPES) ×3 IMPLANT
SOLUTION ANTI FOG 6CC (MISCELLANEOUS) ×3 IMPLANT
SOLUTION ELECTROLUBE (MISCELLANEOUS) ×3 IMPLANT
SPONGE GAUZE 2X2 STER 10/PKG (GAUZE/BANDAGES/DRESSINGS)
SPONGE LAP 18X18 X RAY DECT (DISPOSABLE) IMPLANT
STRIP CLOSURE SKIN 1/2X4 (GAUZE/BANDAGES/DRESSINGS) ×2 IMPLANT
SUT VIC AB 0 CT1 27 (SUTURE) ×2
SUT VIC AB 0 CT1 27XBRD ANTBC (SUTURE) ×1 IMPLANT
SUT VIC AB 4-0 PS2 27 (SUTURE) ×6 IMPLANT
SUT VICRYL 0 UR6 27IN ABS (SUTURE) IMPLANT
SYR 50ML LL SCALE MARK (SYRINGE) ×3 IMPLANT
SYR BULB IRRIGATION 50ML (SYRINGE) IMPLANT
TOWEL OR 17X26 10 PK STRL BLUE (TOWEL DISPOSABLE) ×6 IMPLANT
TRAP SPECIMEN MUCOUS 40CC (MISCELLANEOUS) IMPLANT
TRAY FOLEY CATH 14FRSI W/METER (CATHETERS) ×3 IMPLANT
TRAY LAP CHOLE (CUSTOM PROCEDURE TRAY) ×3 IMPLANT
TROCAR 12M 150ML BLUNT (TROCAR) ×3 IMPLANT
TROCAR BLADELESS OPT 5 100 (ENDOMECHANICALS) ×3 IMPLANT
TROCAR XCEL 12X100 BLDLESS (ENDOMECHANICALS) ×3 IMPLANT
TUBING INSUFFLATION 10FT LAP (TUBING) ×3 IMPLANT
WATER STERILE IRR 1500ML POUR (IV SOLUTION) IMPLANT

## 2013-12-03 NOTE — Preoperative (Signed)
Beta Blockers   Reason not to administer Beta Blockers:Not Applicable 

## 2013-12-03 NOTE — H&P (View-Only) (Signed)
Consult Note: Gyn-Onc  Consult was requested by Dr. Harrington Challenger for the evaluation of Mandy Holmes 63 y.o. female  CC:  Chief Complaint  Patient presents with  . endometrial cancer    Assessment/Plan:  [name]  is a 63 y.o. with FIGO grade 1 endometrial adenocarcinoma.  A detailed discussion was held with the patient with regard to to her endometrial cancer diagnosis. We discussed the standard management options for uterine cancer which includes surgery followed possibly by adjuvant therapy depending on the results of surgery. The options for surgical management include a hysterectomy and removal of the tubes and ovaries possibly with removal of pelvic and para-aortic lymph nodes. A minimally invasive approach including a robotic hysterectomy or laparoscopic hysterectomy have benefits including shorter hospital stay, recovery time and better wound healing. The alternative approach is an open hysterectomy. The patient has been counseled about these surgical options and the risks of surgery in general including infection, bleeding, damage to surrounding structures (including bowel, bladder, ureters, nerves or vessels).   I extensively reviewed the additional risks of robotic hysterectomy including possible need for conversion to open laparotomy.  I discussed positioning during surgery of trendelenberg and risks of minor facial swelling and care we take in preoperative positioning.  After counseling and consideration of her options, she desires to proceed with a minimally invasive endometrial cancer staging procedure on December 03, 2013 with Dr. Nancy Marus.  She has been advised to discontinue the use of BC powder 10 days prior to surgery.  She will be seen by anesthesia for preoperative clearance and discussion of postoperative pain management.  She was given the opportunity to ask questions, which were answered to her satisfaction, and she is agreement with the above mentioned plan of  care.  HPI: [name] is a 63 y.o. LNMP 10 years he has been on Vivelle-Dot and progesterone for hormonal therapy.  She reports bleeding 01/2012.  Evaluation at that time was notable for benign weakly proliferative endometrium no atypia or hyperplasia.  Mandy Holmes did well until 9 months ago when she noted irregular bleeding.  She presented for evaluation and an endometrial biopsy  10/22/2013 was notable for endometrial adenocarcinoma FIGO grade 1 and a benign endocervical polyp.  The uterus sounded to 7 cm.  Mandy Holmes reports less bleeding since the procedure.  She denies weight loss is no nausea vomiting no changes in bowel or bladder habits no abdominal distention.  Her family history is notable for mother diagnosed with uterine  cancer at age 7 and a brother with melanoma.     Current Meds:  Outpatient Encounter Prescriptions as of 11/07/2013  Medication Sig  . Aspirin-Salicylamide-Caffeine (BC HEADACHE POWDER PO) Take by mouth.  Marland Kitchen BIOTIN 5000 PO Take 10,000 mcg by mouth.  . calcium carbonate (TUMS - DOSED IN MG ELEMENTAL CALCIUM) 500 MG chewable tablet Chew 1 tablet by mouth daily.  . cyanocobalamin 100 MCG tablet Take 1,000 mcg by mouth daily.  Marland Kitchen EVENING PRIMROSE OIL PO Take 1 each by mouth daily.  . IODINE, KELP, PO Take by mouth.  Marland Kitchen MAGNESIUM GLYCINATE PLUS PO Take 1 each by mouth daily.  . Multiple Vitamin (MULTIVITAMIN) tablet Take 1 tablet by mouth daily.  . Omega-3 Fatty Acids (FISH OIL PO) Take by mouth.  Marland Kitchen PROGESTERONE IM Inject into the muscle. 1 x week  . vitamin C (ASCORBIC ACID) 500 MG tablet Take 3,000 mg by mouth daily.  . Vitamin D, Ergocalciferol, (DRISDOL)  50000 UNITS CAPS capsule Take 50,000 Units by mouth every 7 (seven) days.  . HYDROcodone-acetaminophen (NORCO/VICODIN) 5-325 MG per tablet Take 1 tablet by mouth every 6 (six) hours as needed for moderate pain.    Allergy: No Known Allergies  Social Hx:   History   Social History  . Marital  Status: Married    Spouse Name: N/A    Number of Children: N/A  . Years of Education: N/A   Occupational History  . Not on file.   Social History Main Topics  . Smoking status: Not on file  . Smokeless tobacco: Not on file  . Alcohol Use: Not on file  . Drug Use: Not on file  . Sexual Activity: Not on file   Other Topics Concern  . Not on file   Social History Narrative  . No narrative on file    Past Surgical Hx: No past surgical history on file.  Past Medical Hx: No past medical history on file. Migraines  Cold thyroid nodule  Past Gynecological History:Menarche 11/regular/3days LNMP 10 years ago.  HT since menopause  Vivelle dot and progesteronex5/week. No h/o abnormal pap.  Tubal 1981  Family Hx: No family history on file. Mother uterine cancer dx 83 Brother melanoma  Review of Systems:  Constitutional  Feels well,   Cardiovascular  No chest pain, shortness of breath, or edema  Pulmonary  No cough or wheeze.  Gastro Intestinal  No nausea, vomitting, or diarrhoea. No bright red blood per rectum, no abdominal pain, change in bowel movement, or constipation. No weight loss, no nausea vomiting or early satiety. Genito Urinary  No frequency, urgency, dysuria, intermittent spotting. Musculo Skeletal  No myalgia, arthralgia, joint swelling or pain  Neurologic  No weakness, numbness, change in gait,  Psychology  No depression, anxiety, insomnia.   Vitals:  Blood pressure 123/91, pulse 83, temperature 98.6 F (37 C), temperature source Oral, resp. rate 16, height 5' 5" (1.651 m), weight 182 lb 11.2 oz (82.872 kg). Body mass index is 30.4 kg/(m^2).  Physical Exam: WD in NAD Neck  Supple NROM, without any enlargements.  Lymph Node Survey No cervical supraclavicular or inguinal adenopathy Cardiovascular  Pulse normal rate, regularity and rhythm. S1 and S2 normal.  Lungs  Clear to auscultation bilateraly,  Good air movement.  Psychiatry  Alert and oriented to  person, place, and time good mood, appropriate affect speech and judgement Abdomen  Normoactive bowel sounds, abdomen soft, non-tender and obese. Umbilical incision intact without hernia Back No CVA tenderness Genito Urinary  Vulva/vagina: Normal external female genitalia.   Bladder/urethra:  No lesions or masses  Vagina: no blood or discharge in the vaginal vault.  Cervix: Normal appearing, no lesions.  Uterus: Unable to assess size, mobile, no parametrial involvement or nodularity.  Adnexa: unable to assess, no cul de sac nodularity Rectal  Good tone, no masses no cul de sac nodularity.  Extremities  No bilateral cyanosis, clubbing or edema.   Leylah Tarnow, MD, PhD 11/07/2013, 1:29 PM    

## 2013-12-03 NOTE — Op Note (Signed)
PATIENT: Mandy Holmes DATE OF BIRTH: July 06, 1951 ENCOUNTER DATE:    Preop Diagnosis: grade 1 endometrioid adenocarcinoma.   Postoperative Diagnosis: same.   Surgery: Total robotic hysterectomy bilateral salpingo-oophorectomy, bilateral pelvic lymph node dissection.   Surgeons:  Lucita Lora. Alycia Rossetti, MD; Lahoma Crocker, MD   Anesthesia: General   Estimated blood loss:  10 ml   IVF: 1500  ml   Urine output: 478 ml   Complications: None   Pathology: Uterus, cervix, bilateral tubes and ovaries, bilateral pelvic lymph nodes to pathology.   Operative findings: Globular fibroid uterus about 12 week size with intramural and subserosal myomas. Normal appearing adnexa. Frozen section did not reveal deep myometrial invasion.  Procedure: The patient was identified in the preoperative holding area. Informed consent was signed on the chart. Patient was seen history was reviewed and exam was performed.   The patient was then taken to the operating room and placed in the supine position with SCD hose on. She was then placed in the dorsolithotomy position. Her arms were tucked at her side with appropriate precautions on the gel pad. General anesthesia was then induced without difficulty. Shoulder blocks were then placed in the usual fashion with appropriate precautions. A OG-tube was placed to suction. First timeout was performed to confirm the patient, procedure, antibiotic, allergy status, estimated blood loss and OR time. The perineum was then prepped in the usual fashion with Betadine. A 14 French Foley was inserted into the bladder under sterile conditions. A sterile speculum was placed in the vagina. The cervix was without lesions. The cervix was grasped with a single-tooth tenaculum. The dilator without difficulty. A ZUMI with a medium Koe ring was placed without difficulty. The abdomen was then prepped with 1 Chlor prep sponge per protocol.   Patient was then draped after the prep was dried.  Second timeout was performed to confirm the above. After again confirming OG tube placement and it was to suction. A stab-wound was made in left upper quadrant 2 cm below the costal margin on the left in the midclavicular line. A 5 mm operative report was used to assure intra-abdominal placement. The abdomen was insufflated. At this point all points during the procedure the patient's intra-abdominal pressure was not increased over 15 mm of mercury. After insufflation was complete, the patient was placed in deep Trendelenburg position. 25 cm above the pubic symphysis that area was marked the camera port. Bilateral robotic ports were marked 10 cm from the midline incision at approximately 5 angle. Under direct visualization each of the trochars was placed into the abdomen. The small bowel was folded on its mesentery to allow visualization to the pelvis. The 5 mm LUQ port was then converted to a 10/12 port under direct visualization.  After assuring adequate visualization, the robot was then docked in the usual fashion. Under direct visualization the robotic instruments replaced.   The round ligament on the patient's right side was transected with monopolar cautery. The anterior and posterior leaves of the broad ligament were then taken down in the usual fashion. The ureter was identified on the medial leaf of the broad ligament. A window was made between the IP and the ureter. The IP was coagulated with bipolar cautery and transected. The posterior leaf of the broad ligament was taken down to the level of the KOH ring. The bladder flap was created using meticulous dissection and pinpoint cautery. The uterine vessels were coagulated with bipolar cautery. The uterine vessels were then transected and the C  loop was created. The same procedure was performed on the patient's left side. Two subserosal myomas were removed to allow delivery of the uterus through the colpotomy.  The pneumo-occulder in the vagina was then  insufflated. The colpotomy was then created in the usual fashion. The specimen was then delivered to the vagina and sent for frozen section. Our attention was then drawn to opening the paravesical space on her right side the perirectal space was also opened. The obturator nerve was identified. The nodal bundle extending over the external iliac artery down to the external iliac vein was taken down using sharp dissection and monopolar cautery. The genitofemoral nerve was identified and spared. We continued our dissection down to the level of the obturator nerve. The nodal bundle superior to the obturator nerve was taken. All pedicles were noted to be hemostatic the ureter was noted to be well medial of the area of dissection. The nodal bundle was then placed and an Endo catch bag. The same procedure was performed on the left.  All specimens were delivered to the vagina. At this point frozen section returned as minimal myometrial invasion of grade 1 lesion. The decision was made to stop the procedure is no further lymphadenectomy was indicated.   The vaginal cuff was closed with a running 0 Vicryl on CT 1 suture. The abdomen and pelvis were copiously irrigated and noted to be hemostatic. The robotic instruments were removed under direct visualization as were the robotic trochars. The pneumoperitoneum was removed. The patient was then taken out of the Trendelenburg position. Using of 0 Vicryl on a UR 6 needle the midline port fascia was closed after being grasped with allis clamps. The subcutaneous tissues of the port in the left upper quadrant was reapproximated. The skin was closed using 4-0 Vicryl. Steri-Strips and benzoin were applied. The pneumo occluded balloon was removed from the vagina. The vagina was swabbed and noted to be hemostatic.   All instrument needle and Ray-Tec counts were correct x2. The patient tolerated the procedure well and was taken to the recovery room in stable condition. This is Nancy Marus dictating an operative note on patient Mandy Holmes.

## 2013-12-03 NOTE — Anesthesia Procedure Notes (Signed)
Procedure Name: Intubation Date/Time: 12/03/2013 8:05 AM Performed by: Ofilia Neas Pre-anesthesia Checklist: Emergency Drugs available, Patient identified, Suction available, Patient being monitored and Timeout performed Patient Re-evaluated:Patient Re-evaluated prior to inductionOxygen Delivery Method: Circle system utilized Preoxygenation: Pre-oxygenation with 100% oxygen Intubation Type: IV induction and Cricoid Pressure applied Ventilation: Mask ventilation without difficulty Laryngoscope Size: Mac and 3 Grade View: Grade II Tube type: Oral Tube size: 7.5 mm Number of attempts: 1 Airway Equipment and Method: Stylet Placement Confirmation: ETT inserted through vocal cords under direct vision and positive ETCO2 Secured at: 21 cm Tube secured with: Tape Dental Injury: Injury to lip and Teeth and Oropharynx as per pre-operative assessment  Future Recommendations: Recommend- induction with short-acting agent, and alternative techniques readily available

## 2013-12-03 NOTE — Interval H&P Note (Signed)
History and Physical Interval Note:  12/03/2013 7:36 AM  Mandy Holmes  has presented today for surgery, with the diagnosis of ENDOMETRIAL CANCER  The various methods of treatment have been discussed with the patient and family. After consideration of risks, benefits and other options for treatment, the patient has consented to  Procedure(s): ROBOTIC [place] (N/A) as a surgical intervention .  The patient's history has been reviewed, patient examined, no change in status, stable for surgery.  I have reviewed the patient's chart and labs.  Questions were answered to the patient's satisfaction.     Clifton A.

## 2013-12-03 NOTE — Anesthesia Postprocedure Evaluation (Signed)
Anesthesia Post Note  Patient: Mandy Holmes  Procedure(s) Performed: Procedure(s) (LRB): ROBOTIC ASSISTED TOTAL HYSTERECTOMY WITH BILATERAL SALPINGO OOPHORECTOMY WITH BILATERAL LYMPH NODE DISSECTION (N/A)  Anesthesia type: General  Patient location: PACU  Post pain: Pain level controlled  Post assessment: Post-op Vital signs reviewed  Last Vitals:  Filed Vitals:   12/03/13 1345  BP: 119/78  Pulse: 72  Temp: 36.6 C  Resp: 14    Post vital signs: Reviewed  Level of consciousness: sedated  Complications: No apparent anesthesia complications

## 2013-12-03 NOTE — Transfer of Care (Signed)
Immediate Anesthesia Transfer of Care Note  Patient: Mandy Holmes  Procedure(s) Performed: Procedure(s): ROBOTIC ASSISTED TOTAL HYSTERECTOMY WITH BILATERAL SALPINGO OOPHORECTOMY WITH BILATERAL LYMPH NODE DISSECTION (N/A)  Patient Location: PACU  Anesthesia Type:General  Level of Consciousness: awake, pateint uncooperative, confused, lethargic and responds to stimulation  Airway & Oxygen Therapy: Patient Spontanous Breathing and Patient connected to face mask oxygen  Post-op Assessment: Report given to PACU RN, Post -op Vital signs reviewed and stable and Patient moving all extremities  Post vital signs: Reviewed and stable  Complications: No apparent anesthesia complications

## 2013-12-04 ENCOUNTER — Encounter (HOSPITAL_COMMUNITY): Payer: Self-pay | Admitting: Gynecologic Oncology

## 2013-12-04 LAB — BASIC METABOLIC PANEL
BUN: 11 mg/dL (ref 6–23)
CALCIUM: 9.8 mg/dL (ref 8.4–10.5)
CHLORIDE: 102 meq/L (ref 96–112)
CO2: 26 meq/L (ref 19–32)
Creatinine, Ser: 0.57 mg/dL (ref 0.50–1.10)
GFR calc Af Amer: >90 mL/min (ref >90)
GFR calc non Af Amer: >90 mL/min (ref >90)
GLUCOSE: 117 mg/dL — AB (ref 70–99)
Potassium: 4.2 mEq/L (ref 3.7–5.3)
SODIUM: 137 meq/L (ref 137–147)

## 2013-12-04 LAB — CBC
HEMATOCRIT: 38.1 % (ref 36.0–46.0)
Hemoglobin: 12.5 g/dL (ref 12.0–15.0)
MCH: 30 pg (ref 26.0–34.0)
MCHC: 32.8 g/dL (ref 30.0–36.0)
MCV: 91.4 fL (ref 78.0–100.0)
Platelets: 271 10*3/uL (ref 150–400)
RBC: 4.17 MIL/uL (ref 3.87–5.11)
RDW: 12.9 % (ref 11.5–15.5)
WBC: 11.9 10*3/uL — ABNORMAL HIGH (ref 4.0–10.5)

## 2013-12-04 MED ORDER — OXYCODONE-ACETAMINOPHEN 5-325 MG PO TABS: 1.0000 | ORAL_TABLET | ORAL | Status: DC | PRN

## 2013-12-04 NOTE — Discharge Instructions (Signed)
12/04/2013  Return to work: 4-6 weeks  Activity: 1. Be up and out of the bed during the day.  Take a nap if needed.  You may walk up steps but be careful and use the hand rail.  Stair climbing will tire you more than you think, you may need to stop part way and rest.   2. No lifting or straining for 6 weeks.  3. No driving for 1 week.  Do not drive if you are taking narcotic pain medicine.  4. Shower daily.  Use soap and water on your incision and pat dry; don't rub.   5. No sexual activity and nothing in the vagina for 8 weeks.  Diet: 1. Low sodium Heart Healthy Diet is recommended.  2. It is safe to use a laxative if you have difficulty moving your bowels.   Wound Care: 1. Keep clean and dry.  Shower daily.  Reasons to call the Doctor:  Fever - Oral temperature greater than 100.4 degrees Fahrenheit  Foul-smelling vaginal discharge  Difficulty urinating  Nausea and vomiting  Increased pain at the site of the incision that is unrelieved with pain medicine.  Difficulty breathing with or without chest pain  New calf pain especially if only on one side  Sudden, continuing increased vaginal bleeding with or without clots.   Contacts: For questions or concerns you should contact:  Dr. Lahoma Crocker at [phone]  Dr. Alycia Rossetti at [place]  Joylene John, NP at [phone]  Acetaminophen; Oxycodone tablets What is this medicine? ACETAMINOPHEN; OXYCODONE (a set a MEE noe fen; ox i KOE done) is a pain reliever. It is used to treat mild to moderate pain. This medicine may be used for other purposes; ask your health care provider or pharmacist if you have questions. COMMON BRAND NAME(S): Endocet, Magnacet, Narvox, Percocet, Perloxx, Primalev, Primlev, Roxicet, Xolox What should I tell my health care provider before I take this medicine? They need to know if you have any of these conditions: -brain tumor -Crohn's disease, inflammatory bowel disease, or  ulcerative colitis -drug abuse or addiction -head injury -heart or circulation problems -if you often drink alcohol -kidney disease or problems going to the bathroom -liver disease -lung disease, asthma, or breathing problems -an unusual or allergic reaction to acetaminophen, oxycodone, other opioid analgesics, other medicines, foods, dyes, or preservatives -pregnant or trying to get pregnant -breast-feeding How should I use this medicine? Take this medicine by mouth with a full glass of water. Follow the directions on the prescription label. Take your medicine at regular intervals. Do not take your medicine more often than directed. Talk to your pediatrician regarding the use of this medicine in children. Special care may be needed. Patients over 69 years old may have a stronger reaction and need a smaller dose. Overdosage: If you think you have taken too much of this medicine contact a poison control center or emergency room at once. NOTE: This medicine is only for you. Do not share this medicine with others. What if I miss a dose? If you miss a dose, take it as soon as you can. If it is almost time for your next dose, take only that dose. Do not take double or extra doses. What may interact with this medicine? -alcohol -antihistamines -barbiturates like amobarbital, butalbital, butabarbital, methohexital, pentobarbital, phenobarbital, thiopental, and secobarbital -benztropine -drugs for bladder problems like solifenacin, trospium, oxybutynin, tolterodine, hyoscyamine, and methscopolamine -drugs for breathing problems like ipratropium and tiotropium -drugs for certain stomach or intestine problems  like propantheline, homatropine methylbromide, glycopyrrolate, atropine, belladonna, and dicyclomine -general anesthetics like etomidate, ketamine, nitrous oxide, propofol, desflurane, enflurane, halothane, isoflurane, and sevoflurane -medicines for depression, anxiety, or psychotic  disturbances -medicines for sleep -muscle relaxants -naltrexone -narcotic medicines (opiates) for pain -phenothiazines like perphenazine, thioridazine, chlorpromazine, mesoridazine, fluphenazine, prochlorperazine, promazine, and trifluoperazine -scopolamine -tramadol -trihexyphenidyl This list may not describe all possible interactions. Give your health care provider a list of all the medicines, herbs, non-prescription drugs, or dietary supplements you use. Also tell them if you smoke, drink alcohol, or use illegal drugs. Some items may interact with your medicine. What should I watch for while using this medicine? Tell your doctor or health care professional if your pain does not go away, if it gets worse, or if you have new or a different type of pain. You may develop tolerance to the medicine. Tolerance means that you will need a higher dose of the medication for pain relief. Tolerance is normal and is expected if you take this medicine for a long time. Do not suddenly stop taking your medicine because you may develop a severe reaction. Your body becomes used to the medicine. This does NOT mean you are addicted. Addiction is a behavior related to getting and using a drug for a non-medical reason. If you have pain, you have a medical reason to take pain medicine. Your doctor will tell you how much medicine to take. If your doctor wants you to stop the medicine, the dose will be slowly lowered over time to avoid any side effects. You may get drowsy or dizzy. Do not drive, use machinery, or do anything that needs mental alertness until you know how this medicine affects you. Do not stand or sit up quickly, especially if you are an older patient. This reduces the risk of dizzy or fainting spells. Alcohol may interfere with the effect of this medicine. Avoid alcoholic drinks. There are different types of narcotic medicines (opiates) for pain. If you take more than one type at the same time, you may have  more side effects. Give your health care provider a list of all medicines you use. Your doctor will tell you how much medicine to take. Do not take more medicine than directed. Call emergency for help if you have problems breathing. The medicine will cause constipation. Try to have a bowel movement at least every 2 to 3 days. If you do not have a bowel movement for 3 days, call your doctor or health care professional. Do not take Tylenol (acetaminophen) or medicines that have acetaminophen with this medicine. Too much acetaminophen can be very dangerous. Many nonprescription medicines contain acetaminophen. Always read the labels carefully to avoid taking more acetaminophen. What side effects may I notice from receiving this medicine? Side effects that you should report to your doctor or health care professional as soon as possible: -allergic reactions like skin rash, itching or hives, swelling of the face, lips, or tongue -breathing difficulties, wheezing -confusion -light headedness or fainting spells -severe stomach pain -unusually weak or tired -yellowing of the skin or the whites of the eyes  Side effects that usually do not require medical attention (report to your doctor or health care professional if they continue or are bothersome): -dizziness -drowsiness -nausea -vomiting This list may not describe all possible side effects. Call your doctor for medical advice about side effects. You may report side effects to FDA at 1-800-FDA-1088. Where should I keep my medicine? Keep out of the reach of children. This  medicine can be abused. Keep your medicine in a safe place to protect it from theft. Do not share this medicine with anyone. Selling or giving away this medicine is dangerous and against the law. Store at room temperature between 20 and 25 degrees C (68 and 77 degrees F). Keep container tightly closed. Protect from light. This medicine may cause accidental overdose and death if it is  taken by other adults, children, or pets. Flush any unused medicine down the toilet to reduce the chance of harm. Do not use the medicine after the expiration date. NOTE: This sheet is a summary. It may not cover all possible information. If you have questions about this medicine, talk to your doctor, pharmacist, or health care provider.  2014, [employer]. (2013-05-20 13:17:35)

## 2013-12-04 NOTE — Discharge Summary (Signed)
Physician Discharge Summary  Patient ID: Mandy Holmes MRN: [id number] DOB/AGE: [id number] y.o.  Admit date: 12/03/2013 Discharge date: 12/04/2013  Admission Diagnoses: Endometrial carcinoma  Discharge Diagnoses:  Principal Problem:   Endometrial carcinoma   Discharged Condition:  The patient is in good condition and stable for discharge.    Hospital Course: On 12/03/2013, the patient underwent the following: Procedure(s): ROBOTIC ASSISTED TOTAL HYSTERECTOMY WITH BILATERAL SALPINGO OOPHORECTOMY WITH BILATERAL LYMPH NODE DISSECTION.  The postoperative course was uneventful.  She was discharged to home on postoperative day 1 tolerating a regular diet and passing flatus.  Consults: None  Significant Diagnostic Studies: None  Treatments: surgery: see above  Discharge Exam: Blood pressure 137/82, pulse 81, temperature 98 F (36.7 C), temperature source Oral, resp. rate 16, height 5\' 5"  (1.651 m), weight 182 lb (82.555 kg), SpO2 98.00%. General appearance: alert, cooperative and no distress Resp: clear to auscultation bilaterally Cardio: regular rate and rhythm, S1, S2 normal, no murmur, click, rub or gallop GI: soft, non-tender; bowel sounds normal; no masses,  no organomegaly Extremities: extremities normal, atraumatic, no cyanosis or edema Incision/Wound: Lap sites x4 with steri strips clean, dry, and intact  Disposition: Home  Discharge Orders   Future Appointments Provider Department Dept Phone   01/22/2014 12:00 PM Paola A. Alycia Rossetti, [facility] [phone]   Future Orders Complete By Expires   Call MD for:  difficulty breathing, headache or visual disturbances  As directed    Call MD for:  extreme fatigue  As directed    Call MD for:  hives  As directed    Call MD for:  persistant dizziness or light-headedness  As directed    Call MD for:  persistant nausea and vomiting  As directed    Call MD for:  redness, tenderness, or  signs of infection (pain, swelling, redness, odor or green/yellow discharge around incision site)  As directed    Call MD for:  severe uncontrolled pain  As directed    Call MD for:  temperature >100.4  As directed    Diet - low sodium heart healthy  As directed    Driving Restrictions  As directed    Comments:     No driving for 1 week.  Do not take narcotics and drive.   Increase activity slowly  As directed    Lifting restrictions  As directed    Comments:     No lifting greater than 10 lbs.   Sexual Activity Restrictions  As directed    Comments:     No sexual activity, nothing in the vagina, for 8 weeks.       Medication List    STOP taking these medications       BC HEADACHE POWDER PO     estradiol 0.05 MG/24HR patch  Commonly known as:  VIVELLE-DOT     PROGESTERONE PO      TAKE these medications       acetaminophen 500 MG tablet  Commonly known as:  TYLENOL  Take 1,000 mg by mouth every 6 (six) hours as needed for mild pain, fever or headache.     BIOTIN 5000 PO  Take 10,000 mcg by mouth daily.     calcium citrate-vitamin D 500-400 MG-UNIT chewable tablet  Chew 1 tablet by mouth daily.     EVENING PRIMROSE OIL PO  Take 500 mg by mouth 2 (two) times daily.     FISH OIL PO  Take 1,400 mg  by mouth daily.     IODINE STRONG PO  Take 12.5 mg by mouth daily.     MAGNESIUM GLYCINATE PLUS PO  Take 400 mg by mouth daily.     multivitamin tablet  Take 1 tablet by mouth daily.     oxyCODONE-acetaminophen 5-325 MG per tablet  Commonly known as:  PERCOCET/ROXICET  Take 1-2 tablets by mouth every 4 (four) hours as needed for severe pain (moderate to severe pain).     PROBIOTIC DAILY PO  Take 1 capsule by mouth 2 (two) times daily.     Sage Leaf Powd  Take 570 mg by mouth daily.     thyroid 30 MG tablet  Commonly known as:  ARMOUR  Take 60 mg by mouth daily before breakfast.     Vinpocetine Powd  Take 10 mg by mouth daily.     VITAMIN B-12 PO  Take  5,000 mcg by mouth daily.     vitamin C 1000 MG tablet  Take 3,000 mg by mouth daily.     Vitamin D3 5000 UNITS Caps  Take 5,000 Units by mouth daily.      ASK your doctor about these medications       OVER THE COUNTER MEDICATION  Take 100 mg by mouth 2 (two) times daily. Severiano Gilbert Methane Supplement           Follow-up Information   Follow up with [facility] MD On 01/22/2014. (at 12:00pm at the [facility] for post-op follow up)    Specialty:  Gynecologic Oncology   Contact information:   [address] [place] [address] [phone]       Greater than thirty minutes were spend for face to face discharge instructions and discharge orders/summary in EPIC.   Signed: [name] 12/04/2013, 9:14 AM

## 2013-12-04 NOTE — Progress Notes (Signed)
12/04/13 1045  Reviewed discharge instructions with patient. Patient verbalized understanding of discharge instructions.  Copy of discharge papers and prescription given to patient.

## 2013-12-05 ENCOUNTER — Telehealth: Payer: Self-pay | Admitting: Gynecologic Oncology

## 2013-12-05 NOTE — Telephone Encounter (Signed)
Post op telephone call to check patient status.  Patient describes expected post operative status.  Adequate PO intake reported.  Bowels and bladder functioning without difficulty.  Pain minimal.  Reportable signs and symptoms reviewed.  Follow up appt arranged.  Patient informed of final path report.  Doing well.  Advised to call for any questions or concerns.

## 2013-12-11 ENCOUNTER — Encounter: Payer: Self-pay | Admitting: Gynecologic Oncology

## 2013-12-11 ENCOUNTER — Ambulatory Visit: Payer: 59

## 2013-12-11 ENCOUNTER — Ambulatory Visit: Payer: 59 | Attending: Gynecologic Oncology | Admitting: Gynecologic Oncology

## 2013-12-11 ENCOUNTER — Telehealth: Payer: Self-pay | Admitting: Gynecologic Oncology

## 2013-12-11 VITALS — BP 128/86 | HR 92 | Temp 98.7°F | Resp 16 | Ht 65.0 in | Wt 181.4 lb

## 2013-12-11 DIAGNOSIS — R509 Fever, unspecified: Secondary | ICD-10-CM

## 2013-12-11 DIAGNOSIS — M549 Dorsalgia, unspecified: Secondary | ICD-10-CM

## 2013-12-11 LAB — URINALYSIS, MICROSCOPIC - CHCC
Bilirubin (Urine): NEGATIVE
Glucose: NEGATIVE mg/dL
Ketones: NEGATIVE mg/dL
Nitrite: NEGATIVE
PH: 7 (ref 4.6–8.0)
Protein: NEGATIVE mg/dL
Specific Gravity, Urine: 1.005 (ref 1.003–1.035)
UROBILINOGEN UR: 0.2 mg/dL (ref 0.2–1)

## 2013-12-11 MED ORDER — CIPROFLOXACIN HCL 250 MG PO TABS: 250.0000 mg | ORAL_TABLET | Freq: Two times a day (BID) | ORAL | Status: DC

## 2013-12-11 MED ORDER — HYDROCODONE-ACETAMINOPHEN 5-325 MG PO TABS: 1.0000 | ORAL_TABLET | Freq: Four times a day (QID) | ORAL | Status: DC | PRN

## 2013-12-11 NOTE — Patient Instructions (Signed)
We will contact you with the results of your urinalysis and urine culture.  Increase your fluid intake.  Keep the incisions on your abdomen clean and dry.  Call the office for elevated temperature, worsening of light rash, poor pain control, vaginal bleeding, nausea, or vomiting.  Continue daily Miralax.  Call for any questions or concerns.

## 2013-12-11 NOTE — Progress Notes (Signed)
Follow Up Note: Gyn-Onc  Mandy Holmes 63 y.o. female  CC:  Chief Complaint  Patient presents with  . Back pain, fever post-op    Post-op follow up    HPI:  Mandy Holmes is a 63 year old female, LNMP 10 years ago, who had been on the Vivelle-Dot and progesterone for hormonal therapy since 2001. She reported vaginal bleeding in 01/2012. Evaluation at that time was notable for benign weakly proliferative endometrium with no atypia or hyperplasia. She did well until 9 months ago when she noted irregular bleeding. She presented for evaluation and an endometrial biopsy 10/22/2013 was notable for endometrial adenocarcinoma FIGO grade 1 and a benign endocervical polyp. The uterus sounded to 7 cm.  Her family history is notable for mother diagnosed with uterine cancer at age 61 and a brother with melanoma.  She had a new patient evaluation with Dr. Janie Morning on 11/07/13.  On 12/03/13, she underwent a total robotic hysterectomy, bilateral salpingo-oophorectomy, bilateral pelvic lymph node dissection by Dr. Nancy Marus.  Her post-operative course was uneventful.  Final pathology revealed: 1. 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 - CERVIX: BENIGN SQUAMOUS MUCOSA AND ENDOCERVICAL MUCOSA, NO DYSPLASIA OR MALIGNANCY. - BILATERAL OVARIES AND FALLOPIAN TUBES: BENIGN OVARIAN TISSUE AND FALLOPIAN TUBAL TISSUE, NO EVIDENCE OF MALIGNANCY. 2. Lymph nodes, regional resection, right pelvic - SIX LYMPH NODES, NEGATIVE FOR METASTATIC CARCINOMA (0/6). 3. Lymph nodes, regional resection, left pelvic - THREE LYMPH NODES, NEGATIVE FOR METASTATIC CARCINOMA (0/3).   Interval History:  She presents today with her husband with complaints of moderate mid back pain and elevated temperature.  Reporting the development of mid back pain with low grade temps around 99.0 and 100.0 two days ago.  Intermittent hot/cold  episodes reported since surgery as well.  Tolerating liquids and soft diet but reporting early satiety at times.  Bowels functioning but taking Miralax daily with little faltus passed.  She reports passing little flatus pre-operative as well.  No dysuria, hematuria, frequency, or urgency reported but states she had no symptoms in the past with a UTI.  No history of kidney stones.  No vaginal bleeding or discharge reported.  No rectal bleeding reported and reporting no colonoscopy in the past.  Reporting difficulty abducting her left leg post-operatively that has now resolved.  She also states she was told by her dermatologist that she would never get cancer because her "immune system was able to fight off plantar warts."  "When I found out I had endometrial cancer, I was at work and just cried."  No other concerns voiced.    Review of Systems Constitutional: Feels well except for occasional hot flashes and back pain.  Intermittent elevated temperature.  Early satiety at times at times with decreased appetite since surgery.  No unintentional weight loss or gain.  Cardiovascular: No chest pain, shortness of breath, or edema.  Pulmonary: Mild cough since surgery with no sputum production.  No wheeze.  Gastrointestinal: No nausea, vomiting, or diarrhea. No bright red blood per rectum or change in bowel movement.  Taking Miralax daily.  Genitourinary: No frequency, urgency, or dysuria. No vaginal bleeding or discharge.  Musculoskeletal: Positive for back pain. Neurologic: No weakness, numbness, or change in gait.  Psychology: No depression, anxiety, or insomnia.  Health Maintenance: Mammogram:  Up to date per pt Colonoscopy:  Never had  Current Meds:  Outpatient Encounter Prescriptions as of 12/11/2013  Medication Sig  .  EVENING PRIMROSE OIL PO Take 500 mg by mouth 2 (two) times daily.   Marland Kitchen oxyCODONE-acetaminophen (PERCOCET/ROXICET) 5-325 MG per tablet Take 1-2 tablets by mouth every 4 (four) hours as  needed for severe pain (moderate to severe pain).  Marland Kitchen thyroid (ARMOUR) 30 MG tablet Take 60 mg by mouth daily before breakfast.  . acetaminophen (TYLENOL) 500 MG tablet Take 1,000 mg by mouth every 6 (six) hours as needed for mild pain, fever or headache.  . Ascorbic Acid (VITAMIN C) 1000 MG tablet Take 3,000 mg by mouth daily.  Marland Kitchen BIOTIN 5000 PO Take 10,000 mcg by mouth daily.   . calcium citrate-vitamin D 500-400 MG-UNIT chewable tablet Chew 1 tablet by mouth daily.  . Cholecalciferol (VITAMIN D3) 5000 UNITS CAPS Take 5,000 Units by mouth daily.  . Cyanocobalamin (VITAMIN B-12 PO) Take 5,000 mcg by mouth daily.  Marland Kitchen HYDROcodone-acetaminophen (NORCO/VICODIN) 5-325 MG per tablet Take 1 tablet by mouth every 6 (six) hours as needed for moderate pain.  . Iodine Strong, Lugols, (IODINE STRONG PO) Take 12.5 mg by mouth daily.  Marland Kitchen MAGNESIUM GLYCINATE PLUS PO Take 400 mg by mouth daily.   . Multiple Vitamin (MULTIVITAMIN) tablet Take 1 tablet by mouth daily.  . Omega-3 Fatty Acids (FISH OIL PO) Take 1,400 mg by mouth daily.   Marland Kitchen OVER THE COUNTER MEDICATION Take 100 mg by mouth 2 (two) times daily. Di Indolyl Methane Supplement  . Probiotic Product (PROBIOTIC DAILY PO) Take 1 capsule by mouth 2 (two) times daily.  Lia Hopping Leaf POWD Take 570 mg by mouth daily.  . Vinpocetine POWD Take 10 mg by mouth daily.    Allergy: No Known Allergies  Social Hx:   History   Social History  . Marital Status: Married    Spouse Name: N/A    Number of Children: N/A  . Years of Education: N/A   Occupational History  . Not on file.   Social History Main Topics  . Smoking status: Former Smoker -- 0.25 packs/day for 1 years    Types: Cigarettes    Quit date: 11/29/1969  . Smokeless tobacco: Never Used  . Alcohol Use: Yes     Comment: socially  . Drug Use: No  . Sexual Activity: Not on file   Other Topics Concern  . Not on file   Social History Narrative  . No narrative on file    Past Surgical Hx:   Past Surgical History  Procedure Laterality Date  . Tubal ligation    . Appendectomy    . Knee arthroscopy Left   . Robotic assisted total hysterectomy with bilateral salpingo oopherectomy N/A 12/03/2013    Procedure: ROBOTIC ASSISTED TOTAL HYSTERECTOMY WITH BILATERAL SALPINGO OOPHORECTOMY WITH BILATERAL LYMPH NODE DISSECTION;  Surgeon: Imagene Gurney A. Alycia Rossetti, MD;  Location: [facility];  Service: Gynecology;  Laterality: N/A;    Past Medical Hx:  Past Medical History  Diagnosis Date  . Hypothyroidism   . Venous insufficiency     bilateral lower legs  . Headache(784.0)     migraines  . Cancer     endometrial    Family Hx:  Family History  Problem Relation Age of Onset  . Uterine cancer Mother   . Melanoma Brother     Vitals:  Blood pressure 128/86, pulse 92, temperature 98.7 F (37.1 C), temperature source Oral, resp. rate 16, height 5\' 5"  (1.651 m), weight 181 lb 6.4 oz (82.283 kg).  Physical Exam: General: Well developed, well nourished female in no acute  distress. Alert and oriented x 3.  Cardiovascular: Regular rate and rhythm. S1 and S2 normal.  Lungs: Clear to auscultation bilaterally. No wheezes/crackles/rhonchi noted.  Skin: Light reddened rash to lower back and buttocks.  No lesions present. Back: Positive for CVA tenderness.  Abdomen: Abdomen soft, non-tender and obese. Active bowel sounds in all quadrants. No evidence of a fluid wave or abdominal masses.  Erythema noted around the steri strips over the lap sites.  Two blisters noted to the abdomen, one around the incision at the umbilicus and the other noted to the right of the umbilicus.  Steris strips removed without difficulty.  Abdomen mildly tympanic on percussion.   Pelvic: Deferred.  No vaginal bleeding reported. Extremities: No bilateral cyanosis, edema, or clubbing.   Assessment/Plan:  63 year old with Stage IA endometrioid carcinoma s/p robotic hysterectomy, BSO, and node dissection on 12/03/13 with Dr. Alycia Rossetti.  A  urine sample will be obtained today for analysis and culture to rule out a urinary tract infection.  She will be contacted with the results and recommendations for treatment.  Incisional care discussed.  She is to keep the incisions clean and dry.  She is advised to increase her fluid intake and continue miralax daily unless diarrhea develops.  Reportable signs and symptoms reviewed.  She is advised to call for any questions or concerns.  Follow up with Dr. Alycia Rossetti in April 2015 or sooner if needed.   Fredrick Geoghegan DEAL, NP 12/11/2013, 1:50 PM

## 2013-12-11 NOTE — Telephone Encounter (Signed)
Patient informed of analysis results.  Advised that Cipro BID for 7 days would be sent to her pharmacy.  Advised that she would be contacted with the urine culture results.  She is to call the office with an update in two to three days.  Reportable signs and symptoms reviewed.

## 2013-12-12 LAB — URINE CULTURE

## 2013-12-18 ENCOUNTER — Telehealth: Payer: Self-pay | Admitting: *Deleted

## 2013-12-18 NOTE — Telephone Encounter (Signed)
Called pt to discuss Urine Culture results. Pt states"Some pain in lower back, when I stand up, to brush my teeth. I took last Cipro this morning. " Reviewed with NP, pt  to monitor symptoms if worsen or bgins having burning or pain with urination/fever to notify clinic.Pt verbalized understanding.

## 2013-12-18 NOTE — Telephone Encounter (Signed)
Message copied by Kendyll Huettner, Aletha Halim on Wed Dec 18, 2013 11:21 AM ------      Message from: CROSS, MELISSA D      Created: Mon Dec 16, 2013  9:39 AM       Multiple bacteria noted with urine culture.  Can you check with the patient and see if she is feeling better and tolerating the cipro??               ----- Message -----         From: [facility] Interface         Sent: 12/11/2013  11:08 AM           To: Dorothyann Gibbs, NP                   ------

## 2014-01-03 ENCOUNTER — Telehealth: Payer: Self-pay | Admitting: *Deleted

## 2014-01-03 MED ORDER — VENLAFAXINE HCL ER 37.5 MG PO CP24: 37.5000 mg | ORAL_CAPSULE | Freq: Every day | ORAL | Status: DC

## 2014-01-03 NOTE — Telephone Encounter (Signed)
Pt called, s/p 4 weeks post op surgery. Pt requesting to have letter faxed to [facility] # [phone] for pt to return to work early. Pt also discussed  Mild discomfort with with BM and sitting for long periods of time. Pt verbalized she is taking hot flashes, has to sit on towel due to sweating, hot and cold through out the night, waking up soaking wet. Reviewed with NP, notified pt to try B-Complex daily or effexor 37.5mg  extended relief 1 po Daily. Pt verbalized she would like to try Effexor. Rx sent [facility].

## 2014-01-22 ENCOUNTER — Encounter: Payer: Self-pay | Admitting: Gynecologic Oncology

## 2014-01-22 ENCOUNTER — Ambulatory Visit: Payer: 59 | Attending: Gynecologic Oncology | Admitting: Gynecologic Oncology

## 2014-01-22 VITALS — BP 142/89 | HR 86 | Temp 98.6°F | Resp 16 | Wt 179.3 lb

## 2014-01-22 DIAGNOSIS — C549 Malignant neoplasm of corpus uteri, unspecified: Secondary | ICD-10-CM | POA: Insufficient documentation

## 2014-01-22 DIAGNOSIS — E039 Hypothyroidism, unspecified: Secondary | ICD-10-CM | POA: Insufficient documentation

## 2014-01-22 DIAGNOSIS — C541 Malignant neoplasm of endometrium: Secondary | ICD-10-CM

## 2014-01-22 DIAGNOSIS — Z87891 Personal history of nicotine dependence: Secondary | ICD-10-CM | POA: Insufficient documentation

## 2014-01-22 DIAGNOSIS — Z79899 Other long term (current) drug therapy: Secondary | ICD-10-CM | POA: Insufficient documentation

## 2014-01-22 NOTE — Patient Instructions (Signed)
Return to clinic in 6 months.

## 2014-01-22 NOTE — Progress Notes (Signed)
Follow Up Note: Gyn-Onc  Mandy Holmes 63 y.o. female  CC:  Chief Complaint  Patient presents with  . Endometrial Cancer    HPI:  Mandy Holmes is a 63 year old female, LMP 10 years ago, who had been on the Vivelle-Dot and progesterone for hormonal therapy since 2001. She reported vaginal bleeding in 01/2012. Evaluation at that time was notable for benign weakly proliferative endometrium with no atypia or hyperplasia. She did well until 9 months ago when she noted irregular bleeding. She presented for evaluation and an endometrial biopsy 10/22/2013 was notable for endometrial adenocarcinoma FIGO grade 1 and a benign endocervical polyp. The uterus sounded to 7 cm.  Her family history is notable for mother diagnosed with uterine cancer at age 60 and a brother with melanoma.  She had a new patient evaluation with Dr. Janie Morning on 11/07/13.  On 12/03/13, she underwent a total robotic hysterectomy, bilateral salpingo-oophorectomy, bilateral pelvic lymph node dissection by Dr. Nancy Marus.  Her post-operative course was uneventful.  Final pathology revealed: 1. 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 - CERVIX: BENIGN SQUAMOUS MUCOSA AND ENDOCERVICAL MUCOSA, NO DYSPLASIA OR MALIGNANCY. - BILATERAL OVARIES AND FALLOPIAN TUBES: BENIGN OVARIAN TISSUE AND FALLOPIAN TUBAL TISSUE, NO EVIDENCE OF MALIGNANCY. 2. Lymph nodes, regional resection, right pelvic - SIX LYMPH NODES, NEGATIVE FOR METASTATIC CARCINOMA (0/6). 3. Lymph nodes, regional resection, left pelvic - THREE LYMPH NODES, NEGATIVE FOR METASTATIC CARCINOMA (0/3).   Interval History:  She comes in today for her postoperative check. She was treated with Cipro for urinary tract infection no dominant bacteria did not grow out as showing a 45,000 colony-forming units. She also called and was complaining of increasing night sweats. She was started  on Effexor 37.5 mg. She states her bladder symptoms have improved. With the Effexor she does well during the day but notices increasing vasomotor symptoms at night and taking black cohosh T. We discussed this and discussed changing the timing of her Effexor to later in the day to see if that would help carry her through the evening hours. She did have an episode of bleeding last week and after discussion it did correlate with her lifting her dog for the first time. Her daughter is about 15 pounds. She is complaining of since her surgery her left leg she feels a stretching and a burning in the groin. There is no pain per se is just is uncomfortable. She states that there is some difficulty with motor the left leg but no change in sensory. 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 She notices it when she stay on the counter laying down in bed.  Pathology was reviewed with the patient and her husband and they're very pleased.  Health Maintenance: Mammogram:  Up to date per pt Colonoscopy:  Never had  Current Meds:  Outpatient Encounter Prescriptions as of 01/22/2014  Medication Sig  . acetaminophen (TYLENOL) 500 MG tablet Take 1,000 mg by mouth every 6 (six) hours as needed for mild pain, fever or headache.  . Ascorbic Acid (VITAMIN C) 1000 MG tablet Take 3,000 mg by mouth daily.  Marland Kitchen BIOTIN 5000 PO Take 10,000 mcg by mouth daily.   Marland Kitchen BLACK COHOSH EXTRACT PO Take 1 each by mouth every evening.  . calcium citrate-vitamin D 500-400 MG-UNIT chewable tablet Chew 1 tablet by mouth daily.  . Cholecalciferol (VITAMIN D3) 5000 UNITS CAPS  Take 5,000 Units by mouth daily.  . ciprofloxacin (CIPRO) 250 MG tablet Take 1 tablet (250 mg total) by mouth 2 (two) times daily.  . Cyanocobalamin (VITAMIN B-12 PO) Take 5,000 mcg by mouth daily.  Marland Kitchen EVENING PRIMROSE OIL PO Take 500 mg by mouth 2 (two) times daily.   Marland Kitchen HYDROcodone-acetaminophen (NORCO/VICODIN) 5-325 MG per tablet Take 1 tablet by mouth every  6 (six) hours as needed for moderate pain.  . Iodine Strong, Lugols, (IODINE STRONG PO) Take 12.5 mg by mouth daily.  Marland Kitchen MAGNESIUM GLYCINATE PLUS PO Take 400 mg by mouth daily.   . Multiple Vitamin (MULTIVITAMIN) tablet Take 1 tablet by mouth daily.  . Omega-3 Fatty Acids (FISH OIL PO) Take 1,400 mg by mouth daily.   Marland Kitchen OVER THE COUNTER MEDICATION Take 100 mg by mouth 2 (two) times daily. Di Indolyl Methane Supplement  . oxyCODONE-acetaminophen (PERCOCET/ROXICET) 5-325 MG per tablet Take 1-2 tablets by mouth every 4 (four) hours as needed for severe pain (moderate to severe pain).  . Probiotic Product (PROBIOTIC DAILY PO) Take 1 capsule by mouth 2 (two) times daily.  Lia Hopping Leaf POWD Take 570 mg by mouth daily.  Marland Kitchen thyroid (ARMOUR) 30 MG tablet Take 60 mg by mouth daily before breakfast.  . venlafaxine XR (EFFEXOR-XR) 37.5 MG 24 hr capsule Take 1 capsule (37.5 mg total) by mouth daily with breakfast.  . Vinpocetine POWD Take 10 mg by mouth daily.    Allergy: No Known Allergies  Social Hx:   History   Social History  . Marital Status: Married    Spouse Name: N/A    Number of Children: N/A  . Years of Education: N/A   Occupational History  . Not on file.   Social History Main Topics  . Smoking status: Former Smoker -- 0.25 packs/day for 1 years    Types: Cigarettes    Quit date: 11/29/1969  . Smokeless tobacco: Never Used  . Alcohol Use: Yes     Comment: socially  . Drug Use: No  . Sexual Activity: Not on file   Other Topics Concern  . Not on file   Social History Narrative  . No narrative on file    Past Surgical Hx:  Past Surgical History  Procedure Laterality Date  . Tubal ligation    . Appendectomy    . Knee arthroscopy Left   . Robotic assisted total hysterectomy with bilateral salpingo oopherectomy N/A 12/03/2013    Procedure: ROBOTIC ASSISTED TOTAL HYSTERECTOMY WITH BILATERAL SALPINGO OOPHORECTOMY WITH BILATERAL LYMPH NODE DISSECTION;  Surgeon: Imagene Gurney A. Alycia Rossetti,  MD;  Location: [facility];  Service: Gynecology;  Laterality: N/A;    Past Medical Hx:  Past Medical History  Diagnosis Date  . Hypothyroidism   . Venous insufficiency     bilateral lower legs  . Headache(784.0)     migraines  . Cancer     endometrial    Family Hx:  Family History  Problem Relation Age of Onset  . Uterine cancer Mother   . Melanoma Brother     Vitals:  Blood pressure 142/89, pulse 86, temperature 98.6 F (37 C), temperature source Oral, resp. rate 16, weight 179 lb 4.8 oz (81.33 kg).  Physical Exam: General: Well developed, well nourished female in no acute distress. Alert and oriented x 3.   Back: Positive for CVA tenderness.  Abdomen: Abdomen soft, non-tender and obese. Active bowel sounds in all quadrants. No evidence of a fluid wave or abdominal masses. Incisions are  well-healed the Pelvic: External genitalia within normal limits. Speculum examination reveals a well-healing vaginal cuff. Suture line is intact. Bimanual examination the sutures are still there is a palpably normal. There is no fluctuance tenderness or nodularity. Extremities: No bilateral cyanosis, edema, or clubbing.  Neuro: 5 out of 5 equal motor bilateral lower extremities for both obturator and femoral distributions.  Assessment/Plan:  63 year old with Stage IA endometrioid carcinoma s/p robotic hysterectomy, BSO, and node dissection on 12/03/13.  Final pathology was consistent with a stage IA grade 1 endometrioid adenocarcinoma with negative nodes and she requires no additional treatment. She's doing very well postoperatively. She does have him some irritation in the left is difficult for me to ascertain whether the genitofemoral nerve distribution but there is no sensory issue versus potentially femoral irritation. The patient was offered physical therapy declined at this time. We spoke in of her legs are doing significantly better in the next 2 weeks, she was encouraged to call back to work at  her schedule a physical therapy. If she does well she'll return to see me in 6 months she will also changed the timing of her Effexor to later in the day helps care for the evening hours a little bit better.   Michela Herst A. Alycia Rossetti, NP 01/22/2014, 12:25 PM

## 2014-05-07 ENCOUNTER — Other Ambulatory Visit: Payer: Self-pay | Admitting: Gynecologic Oncology

## 2014-06-05 ENCOUNTER — Other Ambulatory Visit: Payer: Self-pay

## 2014-06-05 DIAGNOSIS — Z1231 Encounter for screening mammogram for malignant neoplasm of breast: Secondary | ICD-10-CM

## 2014-07-15 ENCOUNTER — Ambulatory Visit
Admission: RE | Admit: 2014-07-15 | Discharge: 2014-07-15 | Disposition: A | Discharge status: Home / Self Care | Payer: 59 | Source: Ambulatory Visit

## 2014-07-15 DIAGNOSIS — Z1231 Encounter for screening mammogram for malignant neoplasm of breast: Secondary | ICD-10-CM

## 2014-07-31 ENCOUNTER — Ambulatory Visit: Payer: 59 | Attending: Gynecologic Oncology | Admitting: Gynecologic Oncology

## 2014-07-31 ENCOUNTER — Encounter: Payer: Self-pay | Admitting: Gynecologic Oncology

## 2014-07-31 VITALS — BP 142/99 | HR 83 | Temp 98.1°F | Resp 22 | Wt 175.6 lb

## 2014-07-31 DIAGNOSIS — Z808 Family history of malignant neoplasm of other organs or systems: Secondary | ICD-10-CM | POA: Diagnosis not present

## 2014-07-31 DIAGNOSIS — C541 Malignant neoplasm of endometrium: Secondary | ICD-10-CM | POA: Diagnosis not present

## 2014-07-31 DIAGNOSIS — Z90722 Acquired absence of ovaries, bilateral: Secondary | ICD-10-CM | POA: Insufficient documentation

## 2014-07-31 DIAGNOSIS — E039 Hypothyroidism, unspecified: Secondary | ICD-10-CM | POA: Diagnosis not present

## 2014-07-31 DIAGNOSIS — N951 Menopausal and female climacteric states: Secondary | ICD-10-CM

## 2014-07-31 DIAGNOSIS — R030 Elevated blood-pressure reading, without diagnosis of hypertension: Secondary | ICD-10-CM | POA: Insufficient documentation

## 2014-07-31 DIAGNOSIS — Z90710 Acquired absence of both cervix and uterus: Secondary | ICD-10-CM | POA: Insufficient documentation

## 2014-07-31 DIAGNOSIS — Z8049 Family history of malignant neoplasm of other genital organs: Secondary | ICD-10-CM | POA: Diagnosis not present

## 2014-07-31 DIAGNOSIS — Z87891 Personal history of nicotine dependence: Secondary | ICD-10-CM | POA: Insufficient documentation

## 2014-07-31 DIAGNOSIS — Z79899 Other long term (current) drug therapy: Secondary | ICD-10-CM | POA: Insufficient documentation

## 2014-07-31 DIAGNOSIS — Z8542 Personal history of malignant neoplasm of other parts of uterus: Secondary | ICD-10-CM

## 2014-07-31 MED ORDER — ESTRADIOL 0.05 MG/24HR TD PTTW: 1.0000 | MEDICATED_PATCH | TRANSDERMAL | Status: DC

## 2014-07-31 NOTE — Progress Notes (Signed)
Follow Up Note: Gyn-Onc  Mandy Holmes 63 y.o. female  CC:  Chief Complaint  Patient presents with  . Endometrial cancer    HPI:  Mandy Holmes is a 63 year old female, LMP 10 years ago, who had been on the Vivelle-Dot and progesterone for hormonal therapy since 2001. She reported vaginal bleeding in 01/2012. Evaluation at that time was notable for benign weakly proliferative endometrium with no atypia or hyperplasia. She did well until 9 months ago when she noted irregular bleeding. She presented for evaluation and an endometrial biopsy 10/22/2013 was notable for endometrial adenocarcinoma FIGO grade 1 and a benign endocervical polyp. The uterus sounded to 7 cm.  Her family history is notable for mother diagnosed with uterine cancer at age 46 and a brother with melanoma.  She had a new patient evaluation with Dr. Janie Morning on 11/07/13.  On 12/03/13, she underwent a total robotic hysterectomy, bilateral salpingo-oophorectomy, bilateral pelvic lymph node dissection by Dr. Nancy Marus.  Her post-operative course was uneventful.  Final pathology revealed: 1. 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 - CERVIX: BENIGN SQUAMOUS MUCOSA AND ENDOCERVICAL MUCOSA, NO DYSPLASIA OR MALIGNANCY. - BILATERAL OVARIES AND FALLOPIAN TUBES: BENIGN OVARIAN TISSUE AND FALLOPIAN TUBAL TISSUE, NO EVIDENCE OF MALIGNANCY. 2. Lymph nodes, regional resection, right pelvic - SIX LYMPH NODES, NEGATIVE FOR METASTATIC CARCINOMA (0/6). 3. Lymph nodes, regional resection, left pelvic - THREE LYMPH NODES, NEGATIVE FOR METASTATIC CARCINOMA (0/3).   Interval History:   She is overall doing fairly well she does complain of being tired since her surgery. She stay she's not sleeping well. She wakes up about 3 times a night. She often has to void. Initially it was the hot flashes waking her up but those have resolved. It does  appear that her inability to sleep well at night is interfering her sleep pattern and she is tired during the day. She has tried estroven and that has not really helped her hot flashes during the day. She stopped the Effexor in August as it was not helping her hot flashes. She is complaining of decreased libido. She is complaining of increased headaches since her surgery. She used to have 6-8/month now she has 12 per month. In addition she used to have about 7 migraines a year and now this has increased in frequency which again she believes is due to menopausal changes. She's not yet her had a colonoscopy. She still she states she has not felt good enough to do it. She is up-to-date on her mammograms having had one in October that was negative. She's had her flu shot. Her sister is currently on dialysis. She has a history of renal failure due to diabetes.  Review of Systems  Constitutional: Feels tired with no energy during the day.  Reporting a decrease in frequency of hot flashes but still present. Skin: C/o pale moles Cardiovascular: No chest pain Pulmonary: No cough Gastro Intestinal: Reporting intermittent lower abdominal soreness.  No nausea, vomiting, constipation, or diarrhea reported. No bright red blood per rectum or change in bowel movement.  Genitourinary: Denies vaginal bleeding and discharge.  Psychology: decreased libido    Current Meds:  Outpatient Encounter Prescriptions as of 07/31/2014  Medication Sig  . acetaminophen (TYLENOL) 500 MG tablet Take 1,000 mg by mouth every 6 (six) hours as needed for mild pain, fever or headache.  . Ascorbic Acid (VITAMIN C) 1000 MG tablet Take 3,000 mg  by mouth daily.  . calcium citrate-vitamin D 500-400 MG-UNIT chewable tablet Chew 1 tablet by mouth daily.  . Cholecalciferol (VITAMIN D3) 5000 UNITS CAPS Take 5,000 Units by mouth daily.  . Cyanocobalamin (VITAMIN B-12 PO) Take 5,000 mcg by mouth daily.  Marland Kitchen EVENING PRIMROSE OIL PO Take 500 mg by  mouth 2 (two) times daily.   . Iodine Strong, Lugols, (IODINE STRONG PO) Take 12.5 mg by mouth daily.  Marland Kitchen MAGNESIUM GLYCINATE PLUS PO Take 400 mg by mouth daily.   . Multiple Vitamin (MULTIVITAMIN) tablet Take 1 tablet by mouth daily.  . Omega-3 Fatty Acids (FISH OIL PO) Take 1,400 mg by mouth daily.   . Probiotic Product (PROBIOTIC DAILY PO) Take 1 capsule by mouth 2 (two) times daily.  Marland Kitchen thyroid (ARMOUR) 30 MG tablet Take 60 mg by mouth daily before breakfast.  . venlafaxine XR (EFFEXOR-XR) 37.5 MG 24 hr capsule TAKE 1 CAPSULE BY MOUTH DAILY WITH BREAKFAST.  Marland Kitchen Vinpocetine POWD Take 10 mg by mouth daily.  . [DISCONTINUED] BIOTIN 5000 PO Take 10,000 mcg by mouth daily.   . [DISCONTINUED] BLACK COHOSH EXTRACT PO Take 1 each by mouth every evening.  . [DISCONTINUED] ciprofloxacin (CIPRO) 250 MG tablet Take 1 tablet (250 mg total) by mouth 2 (two) times daily.  . [DISCONTINUED] HYDROcodone-acetaminophen (NORCO/VICODIN) 5-325 MG per tablet Take 1 tablet by mouth every 6 (six) hours as needed for moderate pain.  . [DISCONTINUED] OVER THE COUNTER MEDICATION Take 100 mg by mouth 2 (two) times daily. Di Indolyl Methane Supplement  . [DISCONTINUED] oxyCODONE-acetaminophen (PERCOCET/ROXICET) 5-325 MG per tablet Take 1-2 tablets by mouth every 4 (four) hours as needed for severe pain (moderate to severe pain).  . [DISCONTINUED] Sage Leaf POWD Take 570 mg by mouth daily.    Allergy: No Known Allergies  Social Hx:   History   Social History  . Marital Status: Married    Spouse Name: N/A    Number of Children: N/A  . Years of Education: N/A   Occupational History  . Not on file.   Social History Main Topics  . Smoking status: Former Smoker -- 0.25 packs/day for 1 years    Types: Cigarettes    Quit date: 11/29/1969  . Smokeless tobacco: Never Used  . Alcohol Use: Yes     Comment: socially  . Drug Use: No  . Sexual Activity: Not on file   Other Topics Concern  . Not on file   Social  History Narrative  . No narrative on file    Past Surgical Hx:  Past Surgical History  Procedure Laterality Date  . Tubal ligation    . Appendectomy    . Knee arthroscopy Left   . Robotic assisted total hysterectomy with bilateral salpingo oopherectomy N/A 12/03/2013    Procedure: ROBOTIC ASSISTED TOTAL HYSTERECTOMY WITH BILATERAL SALPINGO OOPHORECTOMY WITH BILATERAL LYMPH NODE DISSECTION;  Surgeon: Imagene Gurney A. Alycia Rossetti, MD;  Location: [facility];  Service: Gynecology;  Laterality: N/A;    Past Medical Hx:  Past Medical History  Diagnosis Date  . Hypothyroidism   . Venous insufficiency     bilateral lower legs  . Headache(784.0)     migraines  . Cancer     endometrial    Family Hx:  Family History  Problem Relation Age of Onset  . Uterine cancer Mother   . Melanoma Brother     Vitals:  Blood pressure 142/99, pulse 83, temperature 98.1 F (36.7 C), temperature source Oral, resp. rate 22, weight  175 lb 9.6 oz (79.652 kg).  Physical Exam: General: Well developed, well nourished female in no acute distress. Alert and oriented x 3.   Neck: Supple, no lymphadenopathy, no thyromegaly.  Lungs: Clear to auscultation bilaterally  Cardiovascular: Regular rate and rhythm  Abdomen: Abdomen soft, non-tender and obese. Active bowel sounds in all quadrants. No evidence of a fluid wave or abdominal masses. Incisions are well-healed  Pelvic: External genitalia within normal limits except for a slight red irritated areas on left labia minora. The patient was shown this area with a mirror. Speculum examination reveals no visible lesions at the vaginal cuff.  Bimanual examination the sutures are still there is a palpably normal. There is no fluctuance tenderness or nodularity. Rectal confirms.  Extremities: No bilateral cyanosis, edema, or clubbing.  Skin: Several skin tags and seborrheic keratoses throughout the upper abdomen and chest   Assessment/Plan:  63 year old with Stage IA  endometrioid carcinoma s/p robotic hysterectomy, BSO, and node dissection on 12/03/13.  Final pathology was consistent with a stage IA grade 1 endometrioid adenocarcinoma with negative nodes and she requires no additional treatment. She continues to suffer from symptoms consistent with menopause despite that she is 60. She was placed on Vivelle-Dot which she took in the past. We discussed that in the prospective cooperative group trial there was no increased risk of endometrial cancer recurrence with low dose estrogen replacement therapy. I did discuss with her however, that the goal will be to wean her off.   We discussed her elevated blood pressure today. She will discuss with her primary care physician Dr. Judeen Hammans who's office has contacted her for follow up of her thyroid.  She'll return to see Dr. Harrington Challenger in 6 months and will return to see [place] in 1 year.    Mandy Holmes A., MD 07/31/2014, 9:09 AM

## 2014-07-31 NOTE — Patient Instructions (Addendum)
Followup with Dr. Harrington Challenger in 6 months. At that time you will need a Pap smear. Please return to see GYN oncology in one year. Please make sure to address your blood pressure issues with your primary care physician.

## 2015-02-11 ENCOUNTER — Emergency Department (HOSPITAL_COMMUNITY): Payer: 59

## 2015-02-11 ENCOUNTER — Observation Stay (HOSPITAL_COMMUNITY)
Admission: EM | Admit: 2015-02-11 | Discharge: 2015-02-12 | Disposition: A | Discharge status: Home / Self Care | Payer: 59 | Attending: Internal Medicine | Admitting: Internal Medicine

## 2015-02-11 DIAGNOSIS — R0789 Other chest pain: Secondary | ICD-10-CM | POA: Diagnosis not present

## 2015-02-11 DIAGNOSIS — R079 Chest pain, unspecified: Secondary | ICD-10-CM | POA: Diagnosis present

## 2015-02-11 DIAGNOSIS — Z87891 Personal history of nicotine dependence: Secondary | ICD-10-CM | POA: Diagnosis not present

## 2015-02-11 DIAGNOSIS — I10 Essential (primary) hypertension: Secondary | ICD-10-CM | POA: Diagnosis not present

## 2015-02-11 DIAGNOSIS — R110 Nausea: Secondary | ICD-10-CM | POA: Insufficient documentation

## 2015-02-11 DIAGNOSIS — Z90710 Acquired absence of both cervix and uterus: Secondary | ICD-10-CM | POA: Diagnosis not present

## 2015-02-11 DIAGNOSIS — Z7982 Long term (current) use of aspirin: Secondary | ICD-10-CM | POA: Diagnosis not present

## 2015-02-11 DIAGNOSIS — E039 Hypothyroidism, unspecified: Secondary | ICD-10-CM | POA: Diagnosis not present

## 2015-02-11 DIAGNOSIS — Z8542 Personal history of malignant neoplasm of other parts of uterus: Secondary | ICD-10-CM | POA: Insufficient documentation

## 2015-02-11 LAB — CBC WITH DIFFERENTIAL/PLATELET
Basophils Absolute: 0 10*3/uL (ref 0.0–0.1)
Basophils Relative: 1 % (ref 0–1)
EOS ABS: 0.2 10*3/uL (ref 0.0–0.7)
Eosinophils Relative: 2 % (ref 0–5)
HCT: 42.6 % (ref 36.0–46.0)
Hemoglobin: 14.1 g/dL (ref 12.0–15.0)
LYMPHS ABS: 1.5 10*3/uL (ref 0.7–4.0)
LYMPHS PCT: 19 % (ref 12–46)
MCH: 29.8 pg (ref 26.0–34.0)
MCHC: 33.1 g/dL (ref 30.0–36.0)
MCV: 90.1 fL (ref 78.0–100.0)
Monocytes Absolute: 0.5 10*3/uL (ref 0.1–1.0)
Monocytes Relative: 6 % (ref 3–12)
NEUTROS PCT: 72 % (ref 43–77)
Neutro Abs: 5.8 10*3/uL (ref 1.7–7.7)
Platelets: 278 10*3/uL (ref 150–400)
RBC: 4.73 MIL/uL (ref 3.87–5.11)
RDW: 12.9 % (ref 11.5–15.5)
WBC: 8.1 10*3/uL (ref 4.0–10.5)

## 2015-02-11 LAB — COMPREHENSIVE METABOLIC PANEL
ALBUMIN: 4.1 g/dL (ref 3.5–5.0)
ALT: 28 U/L (ref 14–54)
AST: 23 U/L (ref 15–41)
Alkaline Phosphatase: 103 U/L (ref 38–126)
Anion gap: 12 (ref 5–15)
BILIRUBIN TOTAL: 0.5 mg/dL (ref 0.3–1.2)
BUN: 17 mg/dL (ref 6–20)
CO2: 21 mmol/L — AB (ref 22–32)
CREATININE: 0.62 mg/dL (ref 0.44–1.00)
Calcium: 9.7 mg/dL (ref 8.9–10.3)
Chloride: 105 mmol/L (ref 101–111)
GFR calc Af Amer: >60 mL/min (ref >60)
Glucose, Bld: 106 mg/dL — ABNORMAL HIGH (ref 70–99)
Potassium: 4 mmol/L (ref 3.5–5.1)
SODIUM: 138 mmol/L (ref 135–145)
Total Protein: 7 g/dL (ref 6.5–8.1)

## 2015-02-11 LAB — TROPONIN I

## 2015-02-11 LAB — I-STAT TROPONIN, ED
Troponin i, poc: 0 ng/mL (ref 0.00–0.08)
Troponin i, poc: 0 ng/mL (ref 0.00–0.08)

## 2015-02-11 LAB — TSH: TSH: 1.405 u[IU]/mL (ref 0.350–4.500)

## 2015-02-11 MED ORDER — ONDANSETRON HCL 4 MG PO TABS: 4.0000 mg | ORAL_TABLET | Freq: Four times a day (QID) | ORAL | Status: DC | PRN

## 2015-02-11 MED ORDER — ACETAMINOPHEN 325 MG PO TABS: 650.0000 mg | ORAL_TABLET | Freq: Four times a day (QID) | ORAL | Status: DC | PRN

## 2015-02-11 MED ORDER — ONDANSETRON HCL 4 MG/2ML IJ SOLN: 4.0000 mg | Freq: Four times a day (QID) | INTRAMUSCULAR | Status: DC | PRN

## 2015-02-11 MED ORDER — ENOXAPARIN SODIUM 40 MG/0.4ML ~~LOC~~ SOLN
40.0000 mg | SUBCUTANEOUS | Status: DC
  Administered 2015-02-11: 40 mg via SUBCUTANEOUS
  Filled 2015-02-11 (×2): qty 0.4

## 2015-02-11 MED ORDER — SODIUM CHLORIDE 0.9 % IJ SOLN
3.0000 mL | Freq: Two times a day (BID) | INTRAMUSCULAR | Status: DC
  Administered 2015-02-11: 3 mL via INTRAVENOUS

## 2015-02-11 MED ORDER — LEVOTHYROXINE SODIUM 75 MCG PO TABS
75.0000 ug | ORAL_TABLET | Freq: Every day | ORAL | Status: DC
  Administered 2015-02-12: 75 ug via ORAL
  Filled 2015-02-11 (×2): qty 1

## 2015-02-11 MED ORDER — NITROGLYCERIN 0.4 MG SL SUBL: 0.4000 mg | SUBLINGUAL_TABLET | SUBLINGUAL | Status: DC | PRN

## 2015-02-11 MED ORDER — ACETAMINOPHEN 650 MG RE SUPP: 650.0000 mg | Freq: Four times a day (QID) | RECTAL | Status: DC | PRN

## 2015-02-11 MED ORDER — GI COCKTAIL ~~LOC~~
30.0000 mL | Freq: Three times a day (TID) | ORAL | Status: DC | PRN
  Filled 2015-02-11: qty 30

## 2015-02-11 NOTE — ED Notes (Signed)
Attempted report to 2W. Floor states pt has no room on the floor yet. It appears room has been assigned to room 27. Will attempt report in 12min.

## 2015-02-11 NOTE — H&P (Signed)
Date: 02/11/2015               Patient Name:  Mandy Holmes MRN: [id number]  DOB: [birth year] Age / Sex: 64 y.o., female   PCP: Karle Starch, MD         Medical Service: Internal Medicine Teaching Service         Attending Physician: Dr. Sid Falcon, MD    First Contact: Dr. Posey Pronto  Pager: [phone]   Second Contact: Dr. Ronnald Ramp  Pager: [phone]        After Hours (After 5p/  First Contact Pager: [phone]  weekends / holidays): Second Contact Pager: [phone]   Chief Complaint: Chest pain  History of Present Illness: [name] is a 64 year old female with endometrial adenocarcinoma FIGO grade 1 status post total hysterectomy and history of hypothyroidism who presented with chest pain. Her husband Mandy Holmes was present at the time of interview and contributed.  Earlier today, she was sitting at her desk at work when she noted the onset of chest pain that radiated from the right to the left down her left arm which described as a "deep pressure" and was notable for a stabbing like sensation along side her left chest. The pain was associated with dizziness and feeling short of breath. She called her husband who told her to take [facility] powder and then come to the ED. She does report having occasional heartburn after eating certain foods, like pizza, which she had last night though did not experience any symptoms last night. Family history is notable for multiple individuals with fatal strokes, including her mother who refused to take anticoagulation for her atrial fibrillation. She is only taking 2 medications: Synthroid, HRT [started since her total hysterectomy last year following diagnosis of endometrial adenocarcinoma]. Otherwise, she denies any prior cardiac history, recent heavy lifting, syncope, history of falls, neck pain, shortness of breath at baseline, cough, fever, recent sick contacts, abdominal symptoms. She lives with her husband at home and enjoys to read in her free time; she  likely denies any tobacco, alcohol, illicit drug use. She does not have a PCP as she works at a [employer] office and is always around physicians. In the ED, her initial troponin was negative.    Meds: No current facility-administered medications for this encounter.   Current Outpatient Prescriptions  Medication Sig Dispense Refill  . Ascorbic Acid (VITAMIN C) 1000 MG tablet Take 3,000 mg by mouth daily.    . Aspirin-Salicylamide-Caffeine 010-272-53.6 MG PACK Take 1 Package by mouth every 6 (six) hours as needed (pain).    . calcium citrate-vitamin D 500-400 MG-UNIT chewable tablet Chew 1 tablet by mouth daily.    . Cholecalciferol (VITAMIN D3) 5000 UNITS CAPS Take 5,000 Units by mouth daily.    . Cyanocobalamin (VITAMIN B-12 PO) Take 5,000 mcg by mouth daily.    Marland Kitchen estradiol (VIVELLE-DOT) 0.05 MG/24HR patch Place 1 patch (0.05 mg total) onto the skin 2 (two) times a week. (Patient taking differently: Place 1 patch onto the skin 2 (two) times a week. Places on Wednesdays and Sundays) 8 patch 12  . EVENING PRIMROSE OIL PO Take 500 mg by mouth 2 (two) times daily.     . Iodine Strong, Lugols, (IODINE STRONG PO) Take 12.5 mg by mouth daily.    Marland Kitchen levothyroxine (SYNTHROID, LEVOTHROID) 75 MCG tablet Take 75 mcg by mouth daily before breakfast.    . MAGNESIUM GLYCINATE PLUS PO Take 400 mg by mouth daily.     Marland Kitchen  Multiple Vitamin (MULTIVITAMIN) tablet Take 1 tablet by mouth daily.    . Omega-3 Fatty Acids (FISH OIL PO) Take 1,400 mg by mouth daily.     . Probiotic Product (PROBIOTIC DAILY PO) Take 1 capsule by mouth 2 (two) times daily.      Allergies: Allergies as of 02/11/2015  . (No Known Allergies)   Past Medical History  Diagnosis Date  . Hypothyroidism   . Venous insufficiency     bilateral lower legs  . Headache(784.0)     migraines  . Cancer     endometrial   Past Surgical History  Procedure Laterality Date  . Tubal ligation    . Appendectomy    . Knee arthroscopy Left   .  Robotic assisted total hysterectomy with bilateral salpingo oopherectomy N/A 12/03/2013    Procedure: ROBOTIC ASSISTED TOTAL HYSTERECTOMY WITH BILATERAL SALPINGO OOPHORECTOMY WITH BILATERAL LYMPH NODE DISSECTION;  Surgeon: Imagene Gurney A. Alycia Rossetti, MD;  Location: [facility];  Service: Gynecology;  Laterality: N/A;   Family History  Problem Relation Age of Onset  . Uterine cancer Mother   . Melanoma Brother    History   Social History  . Marital Status: Married    Spouse Name: N/A  . Number of Children: N/A  . Years of Education: N/A   Occupational History  . Not on file.   Social History Main Topics  . Smoking status: Former Smoker -- 0.25 packs/day for 1 years    Types: Cigarettes    Quit date: 11/29/1969  . Smokeless tobacco: Never Used  . Alcohol Use: Yes     Comment: socially  . Drug Use: No  . Sexual Activity: Not on file   Other Topics Concern  . Not on file   Social History Narrative  . No narrative on file    Review of Systems: As noted in the history of present illness  Physical Exam: Blood pressure 125/78, pulse 88, temperature 98.4 F (36.9 C), temperature source Oral, resp. rate 14, SpO2 93 %.  General: Obese Caucasian female, resting in bed, NAD HEENT: PERRL, EOMI, no scleral icterus, oropharynx clear Cardiac: RRR, no rubs, murmurs or gallops Pulm: clear to auscultation bilaterally in the anterior lung fields, no wheezes, rales, or rhonchi Abd: soft, nontender, nondistended, BS present Ext: warm and well perfused, trace pitting edema in lower extremities bilaterally, no asymmetry noted of bilateral lower extremities Neuro: CN II-XII intact, 5/5 upper and lower extremity strength, 2+ grip strength, 2+ patellar reflexes,   Lab results: Basic Metabolic Panel:  Recent Labs  02/11/15 1231  NA 138  K 4.0  CL 105  CO2 21*  GLUCOSE 106*  BUN 17  CREATININE 0.62  CALCIUM 9.7   Liver Function Tests:  Recent Labs  02/11/15 1231  AST 23  ALT 28  ALKPHOS 103   BILITOT 0.5  PROT 7.0  ALBUMIN 4.1   CBC:  Recent Labs  02/11/15 1231  WBC 8.1  NEUTROABS 5.8  HGB 14.1  HCT 42.6  MCV 90.1  PLT 278   Imaging results:  Dg Chest 2 View  02/11/2015   CLINICAL DATA:  64 year old female with left side sharp chest pain. Hypertension. Nausea. Initial encounter.  EXAM: CHEST  2 VIEW  COMPARISON:  11/29/2013.  FINDINGS: Stable lung volumes. Cardiac size is stable and at the upper limits of normal. Other mediastinal contours are within normal limits. 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 No pneumothorax, pulmonary edema, pleural effusion or No acute osseous abnormality  identified. Visualized tracheal air column is within normal limits. acute pulmonary opacity.  IMPRESSION: No acute cardiopulmonary abnormality.   Electronically Signed   By: Genevie Ann M.D.   On: 02/11/2015 14:15    Other results: EKG: Reviewed and compared with none prior. Normal axis  T-wave inversions in aVR though none prior to compare Nonspecific J-point elevations noted throughout  Assessment & Plan by Problem:  Ms. Natt is a 63 year old female with endometrial adenocarcinoma FIGO grade 1 status post total hysterectomy and history of hypothyroidism who presented with chest pain.  Chest pain: Her symptoms certainly sound concerning for cardiac etiology though initial lab work is reassuring. TIMI score 1 for her age though unknown if she has other cardiac risk factors, like hyperlipidemia, type 2 diabetes, though no history of tobacco use. GERD also possible given that pizza often triggers the symptoms in her however she denied any of them last night following her meal. DVT/PE also possible given that she is on HRT with a history of malignancy though has not had any recent immobilization or surgery; physical exam findings unremarkable for asymmetric leg swelling or increased respiratory distress. Chest x-ray without acute findings, and she denies any  recent sick contacts or symptoms of infection. No activities associated with muscular strain which could also account for her chest pain. -Monitor on telemetry -Check troponins x 3 -Repeat EKG in AM -Continue nitroglycerin sublingual prn for chest pain -Check A1c, lipid panel, TSH  Hypothyroidism: No prior TSH on file.  -Continue Synthroid 75 g -TSH as noted above  Endometrial adenocarcinoma FIGO grade 1 status post total hysterectomy: Diagnosed by Dr. Nancy Marus by endometrial biopsy on 10/22/2013 and is followed by her. She also underwent total hysterectomy on 11/24/13 and is now on HRT. -Hold home HRT  #FEN:  -Diet: Heart healthy  #DVT prophylaxis: Lovenox  #CODE STATUS: FULL CODE -Defer to husband Braylie Badami [phone] if patients lacks decision-making capacity  Dispo: Disposition is deferred at this time, awaiting improvement of current medical problems.   The patient does have a current PCP (Karle Starch, MD) and does not need an [facility] hospital follow-up appointment after discharge.  The patient does not have transportation limitations that hinder transportation to clinic appointments.  Signed: Riccardo Dubin, MD 02/11/2015, 3:47 PM

## 2015-02-11 NOTE — ED Notes (Addendum)
Chest pain sharp and felt like going straight through heart. Woke up with headache and took [facility] powder. 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 Felt nauseated and very lightheaded. No syncope. Hx of endometrial cancer. Has hormone replacement patch.

## 2015-02-11 NOTE — ED Provider Notes (Signed)
CSN: [id number]     Arrival date & time 02/11/15  1221 History   First MD Initiated Contact with Patient 02/11/15 1258     Chief Complaint  Patient presents with  . Chest Pain    HPI   64 year old female presents today with an acute episode of left sided chest pain. Patient reports that she was sitting at her desk this morning when she felt a sharp stabbing pain in her left anterior chest with radiation into her shoulder. She reports that it was associated with nausea, shortness of breath, diaphoresis, dizziness. After the sharp pain she experienced "pressure" that last for approximately 10 minutes. Patient works at a [employer] office to her blood pressure was taken in the left arm showing 158, 160, 152 with a pulse of 81 and in the right arm 154 143 approximately 45 minutes later. Patient reports she is nares patient's pain like this previously, denies any cardiac history. Patient denies headache, abdominal pain, increase in her baseline lower extremity edema or swelling. Significant past history includes uterine cancer with total hysterectomy. Patient denies smoking, hyperglycemia, hyperlipidemia, illicit drug use, or of significant family cardiac history. Patient has history of endometrial cancer with total hysterectomy on 11/24/2013.  Denies history of hypertension. At the time of my evaluation patient reports near resolution of symptoms with only minor chest pressure. She reports initially she had some tenderness to palpation of the chest wall, but denies any at this time.   Past Medical History  Diagnosis Date  . Hypothyroidism   . Venous insufficiency     bilateral lower legs  . Headache(784.0)     migraines  . Cancer     endometrial   Past Surgical History  Procedure Laterality Date  . Tubal ligation    . Appendectomy    . Knee arthroscopy Left   . Robotic assisted total hysterectomy with bilateral salpingo oopherectomy N/A 12/03/2013    Procedure: ROBOTIC ASSISTED TOTAL HYSTERECTOMY  WITH BILATERAL SALPINGO OOPHORECTOMY WITH BILATERAL LYMPH NODE DISSECTION;  Surgeon: Imagene Gurney A. Alycia Rossetti, MD;  Location: [facility];  Service: Gynecology;  Laterality: N/A;   Family History  Problem Relation Age of Onset  . Uterine cancer Mother   . Melanoma Brother    History  Substance Use Topics  . Smoking status: Former Smoker -- 0.25 packs/day for 1 years    Types: Cigarettes    Quit date: 11/29/1969  . Smokeless tobacco: Never Used  . Alcohol Use: Yes     Comment: socially   OB History    No data available     Review of Systems  All other systems reviewed and are negative.   Allergies  Review of patient's allergies indicates no known allergies.  Home Medications   Prior to Admission medications   Medication Sig Start Date End Date Taking? Authorizing Provider  Ascorbic Acid (VITAMIN C) 1000 MG tablet Take 3,000 mg by mouth daily.   Yes Historical Provider, MD  Aspirin-Salicylamide-Caffeine 660-630-16.0 MG PACK Take 1 Package by mouth every 6 (six) hours as needed (pain).   Yes Historical Provider, MD  calcium citrate-vitamin D 500-400 MG-UNIT chewable tablet Chew 1 tablet by mouth daily.   Yes Historical Provider, MD  Cholecalciferol (VITAMIN D3) 5000 UNITS CAPS Take 5,000 Units by mouth daily.   Yes Historical Provider, MD  Cyanocobalamin (VITAMIN B-12 PO) Take 5,000 mcg by mouth daily.   Yes Historical Provider, MD  estradiol (VIVELLE-DOT) 0.05 MG/24HR patch Place 1 patch (0.05 mg total) onto the skin  2 (two) times a week. Patient taking differently: Place 1 patch onto the skin 2 (two) times a week. Places on Wednesdays and Sundays 07/31/14  Yes Nancy Marus, MD  EVENING PRIMROSE OIL PO Take 500 mg by mouth 2 (two) times daily.    Yes Historical Provider, MD  Iodine Strong, [place], (IODINE STRONG PO) Take 12.5 mg by mouth daily.   Yes Historical Provider, MD  levothyroxine (SYNTHROID, LEVOTHROID) 75 MCG tablet Take 75 mcg by mouth daily before breakfast.   Yes Historical  Provider, MD  MAGNESIUM GLYCINATE PLUS PO Take 400 mg by mouth daily.    Yes Historical Provider, MD  Multiple Vitamin (MULTIVITAMIN) tablet Take 1 tablet by mouth daily.   Yes Historical Provider, MD  Omega-3 Fatty Acids (FISH OIL PO) Take 1,400 mg by mouth daily.    Yes Historical Provider, MD  Probiotic Product (PROBIOTIC DAILY PO) Take 1 capsule by mouth 2 (two) times daily.   Yes Historical Provider, MD   BP 143/102 mmHg  Pulse 96  Temp(Src) 98.4 F (36.9 C) (Oral)  Resp 18  SpO2 97%   Physical Exam  Constitutional: She is oriented to person, place, and time. She appears well-developed and well-nourished.  HENT:  Head: Normocephalic and atraumatic.  Eyes: Pupils are equal, round, and reactive to light.  Neck: Normal range of motion. Neck supple. No JVD present. No tracheal deviation present. No thyromegaly present.  Cardiovascular: Normal rate, regular rhythm, normal heart sounds and intact distal pulses.  Exam reveals no gallop and no friction rub.   No murmur heard. Pulmonary/Chest: Effort normal and breath sounds normal. No stridor. No respiratory distress. She has no wheezes. She has no rales. She exhibits no tenderness.  Anterior chest wall nontender to palpation, no signs of trauma, not made worse with inspiration or shoulder manipulation  Abdominal:  Small surgical incision site noted on the abdomen no signs of infection.  Musculoskeletal: Normal range of motion.  Lymphadenopathy:    She has no cervical adenopathy.  Neurological: She is alert and oriented to person, place, and time. Coordination normal.  Skin: Skin is warm and dry.  Psychiatric: She has a normal mood and affect. Her behavior is normal. Judgment and thought content normal.  Nursing note and vitals reviewed.   ED Course  Procedures (including critical care time) Labs Review Labs Reviewed  CBC WITH DIFFERENTIAL/PLATELET  COMPREHENSIVE METABOLIC PANEL  Randolm Idol, ED    Imaging Review Dg  Chest 2 View  02/11/2015   CLINICAL DATA:  64 year old female with left side sharp chest pain. Hypertension. Nausea. Initial encounter.  EXAM: CHEST  2 VIEW  COMPARISON:  11/29/2013.  FINDINGS: Stable lung volumes. Cardiac size is stable and at the upper limits of normal. Other mediastinal contours are within normal limits. 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 No pneumothorax, pulmonary edema, pleural effusion or No acute osseous abnormality identified. Visualized tracheal air column is within normal limits. acute pulmonary opacity.  IMPRESSION: No acute cardiopulmonary abnormality.   Electronically Signed   By: Genevie Ann M.D.   On: 02/11/2015 14:15     EKG Interpretation   Date/Time:  Wednesday Feb 11 2015 12:24:54 EDT Ventricular Rate:  84 PR Interval:    QRS Duration: 78 QT Interval:  330 QTC Calculation: 389 R Axis:   62 Text Interpretation:  Normal sinus rhythm Premature atrial complexes Low  voltage QRS Septal infarct , age undetermined Baseline wander When  compared with ECG of 05/10/2007 No significant change  was found Confirmed  by [facility]  MD, Nunzio Cory [phone]) on 02/11/2015 1:05:48 PM      MDM   Final diagnoses:  Chest pain, unspecified chest pain type    Labs: CBC, CMP, troponin  Imaging: Chest x-ray no acute findings   Bilateral BP equal  Consults: Dr. Ron[facility]  Therapeutics: none  Assessment: chest pain  Plan: Pt present today with chest pain suspicious for ACS. Patients symptoms had resolved at the time of my evaluation, negative EKG, negative first troponin. Due to patient's presentation and concern for ACS I consulted medicine service ACS rule out. Wells score  0. They agreed for hospital admission.      Okey Regal, PA-C 02/11/15 [place], DO 02/15/15 [phone]

## 2015-02-12 ENCOUNTER — Encounter (HOSPITAL_COMMUNITY): Payer: Self-pay | Admitting: General Practice

## 2015-02-12 DIAGNOSIS — R0789 Other chest pain: Secondary | ICD-10-CM | POA: Diagnosis not present

## 2015-02-12 DIAGNOSIS — E039 Hypothyroidism, unspecified: Secondary | ICD-10-CM

## 2015-02-12 DIAGNOSIS — Z87891 Personal history of nicotine dependence: Secondary | ICD-10-CM

## 2015-02-12 DIAGNOSIS — R079 Chest pain, unspecified: Secondary | ICD-10-CM | POA: Diagnosis not present

## 2015-02-12 DIAGNOSIS — C541 Malignant neoplasm of endometrium: Secondary | ICD-10-CM

## 2015-02-12 DIAGNOSIS — Z90710 Acquired absence of both cervix and uterus: Secondary | ICD-10-CM | POA: Diagnosis not present

## 2015-02-12 LAB — TROPONIN I

## 2015-02-12 NOTE — Progress Notes (Signed)
Subjective: This morning, she reports no complaints. We explained to her that her lab work was reassuring along with her telemetry. She is agreeable to following up with [place] in the outpatient clinic and would like to establish care accordingly. We advised her should she have similar symptoms in the future to present to the ED to which she acknowledged understanding.  Objective: Vital signs in last 24 hours: Filed Vitals:   02/11/15 1615 02/11/15 1630 02/12/15 0036 02/12/15 0615  BP: 123/70 121/70 109/65 122/69  Pulse: 81 82 85 80  Temp:   98.9 F (37.2 C) 98.4 F (36.9 C)  TempSrc:   Oral Oral  Resp: 11 15 18 18   SpO2: 96% 96% 97% 96%   Weight change:   Intake/Output Summary (Last 24 hours) at 02/12/15 1116 Last data filed at 02/12/15 0800  Gross per 24 hour  Intake    240 ml  Output      0 ml  Net    240 ml   General: Obese Caucasian female, resting in bed, NAD HEENT: PERRL, EOMI, no scleral icterus, oropharynx clear Cardiac: RRR, no rubs, murmurs or gallops Pulm: clear to auscultation bilaterally in the anterior lung fields, no wheezes, rales, or rhonchi Abd: soft, nontender, nondistended, BS present Ext: warm and well perfused, trace pitting edema in lower extremities bilaterally, no asymmetry noted of bilateral lower extremities Neuro: Responds to questions appropriately, moving all extremities spontaneously  Lab Results: Basic Metabolic Panel:  Recent Labs Lab 02/11/15 1231  NA 138  K 4.0  CL 105  CO2 21*  GLUCOSE 106*  BUN 17  CREATININE 0.62  CALCIUM 9.7   Liver Function Tests:  Recent Labs Lab 02/11/15 1231  AST 23  ALT 28  ALKPHOS 103  BILITOT 0.5  PROT 7.0  ALBUMIN 4.1   CBC:  Recent Labs Lab 02/11/15 1231  WBC 8.1  NEUTROABS 5.8  HGB 14.1  HCT 42.6  MCV 90.1  PLT 278   Cardiac Enzymes:  Recent Labs Lab 02/11/15 2043 02/12/15 0120  TROPONINI <0.03 <0.03   Thyroid Function Tests:  Recent Labs Lab 02/11/15 2043  TSH 1.405    Studies/Results: Dg Chest 2 View  02/11/2015   CLINICAL DATA:  64 year old female with left side sharp chest pain. Hypertension. Nausea. Initial encounter.  EXAM: CHEST  2 VIEW  COMPARISON:  11/29/2013.  FINDINGS: Stable lung volumes. Cardiac size is stable and at the upper limits of normal. Other mediastinal contours are within normal limits. 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 No pneumothorax, pulmonary edema, pleural effusion or No acute osseous abnormality identified. Visualized tracheal air column is within normal limits. acute pulmonary opacity.  IMPRESSION: No acute cardiopulmonary abnormality.   Electronically Signed   By: Genevie Ann M.D.   On: 02/11/2015 14:15   Medications: I have reviewed the patient's current medications. Scheduled Meds: . enoxaparin (LOVENOX) injection  40 mg Subcutaneous Q24H  . levothyroxine  75 mcg Oral QAC breakfast  . sodium chloride  3 mL Intravenous Q12H   Continuous Infusions:  PRN Meds:.acetaminophen **OR** acetaminophen, gi cocktail, nitroGLYCERIN, ondansetron **OR** ondansetron (ZOFRAN) IV Assessment/Plan:  Ms. 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  Chest pain: Symptoms still suspicious for cardiac etiology raising suspicion for unstable angina as pain onset was noted without activity though troponins 3 were unremarkable along with findings from overnight telemetry  monitoring. TSH reassuring. -Follow-up A1c as outpatient  Hypothyroidism: Discharge on Synthroid 75 g  Endometrial adenocarcinoma FIGO grade 1 status post total hysterectomy: Diagnosed by Dr. Nancy Marus by endometrial biopsy on 10/22/2013 and is followed by her. She also underwent total hysterectomy on 11/24/13 and is now on HRT. -Resume home HRT  #FEN:  -Diet: Heart healthy  #DVT  prophylaxis: Lovenox  #CODE STATUS: FULL CODE -Defer to husband Calisa Luckenbaugh [phone] if patients lacks decision-making capacity  Dispo: Plan is to discharge home today.  The patient does not have a current PCP (L.Dean Alroy Dust, MD) and does need an [facility] hospital follow-up appointment after discharge. She has seen him once in the past but has not followed up with him for some time and does not plan to do so.  The patient does not have transportation limitations that hinder transportation to clinic appointments.  .Services Needed at time of discharge: Y = Yes, Blank = No PT:   OT:   RN:   Equipment:   Other:     LOS: 1 day   Riccardo Dubin, MD 02/12/2015, 11:16 AM

## 2015-02-12 NOTE — Progress Notes (Signed)
Patient discharged to home with husband.  IV and telemetry D/C'd by nurse tech.  Discharge instructions reviewed with patient.  Patient transported to discharge by wheelchair by volunteers.

## 2015-02-12 NOTE — Progress Notes (Signed)
  Date: 02/12/2015  Patient name: Mandy Holmes  Medical record number: [id number]  Date of birth: 05-Feb-1951   I have seen and evaluated Mandy Holmes and discussed their care with the Residency Team.  Please see Dr. Serita Grit note from 02/11/15 for more details.  Briefly, Mandy Holmes is a 64yo woman with PMH of endometrial cancer about a year ago and hypothyroidism who presented with chest pain.  She noted that pain started suddenly while sitting at her desk on the right side of her chest and spread to the left.  It radiated to her shoulder.  It was pressure like in nature, somewhat sharp.  She had some lightheadedness and nausea, no vomiting or diaphoresis.  She called her husband and he told her to take a BC powder which she did.  The symptoms lasted about 15 minutes and spontaneously resolved on their own.  She has never had symptoms like this before.  She has a + family history of stroke in mother and GM and CAD in a brother.  She does not smoke.  She is on a HRT patch.  She has no tachycardia, palpitations, SOB, hypoxia, unilateral leg swelling, erythema or warmth.  She has had no long travel or recent surgery.  In the ED, her initial troponin was negative.  EKG showed a TWI in V2, J point elevation.    Assessment and Plan: I have seen and evaluated the patient as outlined above. I agree with the formulated Assessment and Plan as detailed in the residents' admission note, with the following changes:   1. Chest pain, ACS rule out - Trend CE - Telemetry - Evaluate wells and geneva scores, though her symptoms are less concerning for acute clot - Repeat EKG in the AM - Nitroglycerin sublingual PRN for chest pain - A1C, lipid panel, TSH - If repeat chest pain, stat EKG and Troponin  2. Hypothyroidism - Check TSH - Continue synthroid  Other issues per resident note.    Sid Falcon, MD 5/5/20168:22 AM

## 2015-02-12 NOTE — Discharge Summary (Signed)
Name: Mandy Holmes MRN: [id number] DOB: 1951/05/21 64 y.o. PCP: L.Donnie Coffin, MD  Date of Admission: 02/11/2015 12:51 PM Date of Discharge: 02/12/2015 Attending Physician: Sid Falcon, MD  Discharge Diagnosis: Chest pain concerning for unstable angina Hypothyroidism Endometrial adenocarcinoma FIGO grade 1 status post total hysterectomy    Discharge Medications:   Medication List    STOP taking these medications        Aspirin-Salicylamide-Caffeine 638-756-43.3 MG Pack     Vitamin D3 5000 UNITS Caps      TAKE these medications        calcium citrate-vitamin D 500-400 MG-UNIT chewable tablet  Chew 1 tablet by mouth daily.     estradiol 0.05 MG/24HR patch  Commonly known as:  VIVELLE-DOT  Place 1 patch (0.05 mg total) onto the skin 2 (two) times a week.     EVENING PRIMROSE OIL PO  Take 500 mg by mouth 2 (two) times daily.     FISH OIL PO  Take 1,400 mg by mouth daily.     IODINE STRONG PO  Take 12.5 mg by mouth daily.     levothyroxine 75 MCG tablet  Commonly known as:  SYNTHROID, LEVOTHROID  Take 75 mcg by mouth daily before breakfast.     MAGNESIUM GLYCINATE PLUS PO  Take 400 mg by mouth daily.     multivitamin tablet  Take 1 tablet by mouth daily.     PROBIOTIC DAILY PO  Take 1 capsule by mouth 2 (two) times daily.     VITAMIN B-12 PO  Take 5,000 mcg by mouth daily.     vitamin C 1000 MG tablet  Take 3,000 mg by mouth daily.        Disposition and follow-up:   MandyLorelle S Holmes was discharged from [facility] in Stable condition.  At the hospital follow up visit please address:  Referral to cardiology for stress testing Risk stratification with lipid panel, A1c Repeat EKG  Follow-up Appointments: Follow-up Information    Follow up with Clinton Gallant, MD. Go on 02/17/2015.   Specialty:  Internal Medicine   Why:  [phone]   Contact information:   [place] [id number] [phone]       Discharge  Instructions: Discharge Instructions    Call MD for:  difficulty breathing, headache or visual disturbances    Complete by:  As directed      Call MD for:  persistant dizziness or light-headedness    Complete by:  As directed      Call MD for:  severe uncontrolled pain    Complete by:  As directed      Call MD for:  temperature >100.4    Complete by:  As directed            Consultations:    Procedures Performed:  Dg Chest 2 View  02/11/2015   CLINICAL DATA:  64 year old female with left side sharp chest pain. Hypertension. Nausea. Initial encounter.  EXAM: CHEST  2 VIEW  COMPARISON:  11/29/2013.  FINDINGS: Stable lung volumes. Cardiac size is stable and at the upper limits of normal. Other mediastinal contours are within normal limits. 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 No pneumothorax, pulmonary edema, pleural effusion or No acute osseous abnormality identified. Visualized tracheal air column is within normal limits. acute pulmonary opacity.  IMPRESSION: No acute cardiopulmonary abnormality.   Electronically Signed   By: Genevie Ann M.D.   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 Her husband Iona Beard was present at the time of interview and contributed.  Earlier today, she was sitting at her desk at work when she noted the onset of chest pain that radiated from the right to the left down her left arm which described as a "deep pressure" and was notable for a stabbing like sensation along side her left chest. The pain was associated with dizziness and feeling short of breath. She called her husband who told her to take [facility] powder and then come to the ED. She does report having occasional heartburn after eating certain foods, like pizza, which she had last night though did not experience any  symptoms last night. Family history is notable for multiple individuals with fatal strokes, including her mother who refused to take anticoagulation for her atrial fibrillation. She is only taking 2 medications: Synthroid, HRT [started since her total hysterectomy last year following diagnosis of endometrial adenocarcinoma]. Otherwise, she denies any prior cardiac history, recent heavy lifting, syncope, history of falls, neck pain, shortness of breath at baseline, cough, fever, recent sick contacts, abdominal symptoms. She lives with her husband at home and enjoys to read in her free time; she likely denies any tobacco, alcohol, illicit drug use. She does not have a PCP as she works at a [employer] office and is always around physicians. In the ED, her initial troponin was negative.  Hospital Course by problem list:   Chest pain: Possibly unstable angina though TIMI score 1 limited by not knowing if she has hyperlipidemia or type 2 diabetes. Troponins negative x 3 and stable telemetry findings overnight were reassuring to rule out ACS. She was advised to establish PCP care and was agreeable to doing so through the outpatient internal medicine clinic. She was also agreeable to being referred for stress test.  Hypothyroidism: Remain stable on her medications  Endometrial adenocarcinoma FIGO grade 1 status post total hysterectomy: HRT was resumed at the time of discharge as it was not felt that DVT/PE would've contributed to her presentation.  Discharge Vitals:   BP 122/69 mmHg  Pulse 80  Temp(Src) 98.4 F (36.9 C) (Oral)  Resp 18  SpO2 96%  Discharge Labs:  Results for orders placed or performed during the hospital encounter of 02/11/15 (from the past 24 hour(s))  CBC with Differential     Status: None   Collection Time: 02/11/15 12:31 PM  Result Value Ref Range   WBC 8.1 4.0 - 10.5 K/uL   RBC 4.73 3.87 - 5.11 MIL/uL   Hemoglobin 14.1 12.0 - 15.0 g/dL   HCT 42.6 36.0 - 46.0 %   MCV 90.1  78.0 - 100.0 fL   MCH 29.8 26.0 - 34.0 pg   MCHC 33.1 30.0 - 36.0 g/dL   RDW 12.9 11.5 - 15.5 %   Platelets 278 150 - 400 K/uL   Neutrophils Relative % 72 43 - 77 %   Neutro Abs 5.8 1.7 - 7.7 K/uL   Lymphocytes Relative 19 12 - 46 %   Lymphs Abs 1.5 0.7 - 4.0 K/uL   Monocytes Relative 6 3 - 12 %   Monocytes Absolute 0.5 0.1 - 1.0 K/uL   Eosinophils Relative 2 0 - 5 %   Eosinophils Absolute 0.2 0.0 - 0.7 K/uL   Basophils Relative 1 0 - 1 %   Basophils Absolute 0.0  0.0 - 0.1 K/uL  Comprehensive metabolic panel     Status: Abnormal   Collection Time: 02/11/15 12:31 PM  Result Value Ref Range   Sodium 138 135 - 145 mmol/L   Potassium 4.0 3.5 - 5.1 mmol/L   Chloride 105 101 - 111 mmol/L   CO2 21 (L) 22 - 32 mmol/L   Glucose, Bld 106 (H) 70 - 99 mg/dL   BUN 17 6 - 20 mg/dL   Creatinine, Ser 0.62 0.44 - 1.00 mg/dL   Calcium 9.7 8.9 - 10.3 mg/dL   Total Protein 7.0 6.5 - 8.1 g/dL   Albumin 4.1 3.5 - 5.0 g/dL   AST 23 15 - 41 U/L   ALT 28 14 - 54 U/L   Alkaline Phosphatase 103 38 - 126 U/L   Total Bilirubin 0.5 0.3 - 1.2 mg/dL   GFR calc non Af Amer >60 >60 mL/min   GFR calc Af Amer >60 >60 mL/min   Anion gap 12 5 - 15  I-Stat Troponin, ED (not at [facility])     Status: None   Collection Time: 02/11/15 12:54 PM  Result Value Ref Range   Troponin i, poc 0.00 0.00 - 0.08 ng/mL   Comment 3          I-Stat Troponin, ED (not at [facility])     Status: None   Collection Time: 02/11/15  2:27 PM  Result Value Ref Range   Troponin i, poc 0.00 0.00 - 0.08 ng/mL   Comment 3          Troponin I     Status: None   Collection Time: 02/11/15  8:43 PM  Result Value Ref Range   Troponin I <0.03 <0.031 ng/mL  TSH     Status: None   Collection Time: 02/11/15  8:43 PM  Result Value Ref Range   TSH 1.405 0.350 - 4.500 uIU/mL  Troponin I     Status: None   Collection Time: 02/12/15  1:20 AM  Result Value Ref Range   Troponin I <0.03 <0.031 ng/mL    Signed: Riccardo Dubin, MD 02/15/2015, 10:59 AM     Services Ordered on Discharge: None Equipment Ordered on Discharge: None

## 2015-02-12 NOTE — Discharge Instructions (Signed)
Thank you for trusting [place] with your medical care!  You were hospitalized for chest pain and monitored overnight.   To make sure you are getting better, please make it to the follow-up appointments listed on the first page.  If you have any questions, please call [phone].

## 2015-02-13 LAB — HEMOGLOBIN A1C
Hgb A1c MFr Bld: 5.9 % — ABNORMAL HIGH (ref 4.8–5.6)
MEAN PLASMA GLUCOSE: 123 mg/dL

## 2015-02-13 NOTE — Progress Notes (Signed)
UR Completed. Cale Bethard, RN, BSN.  [phone] 

## 2015-02-13 NOTE — Care Management Note (Signed)
Case Management Note  Patient Details  Name: ANANI GU MRN: [id number] Date of Birth: [birth year]  Subjective/Objective:  Pt admitted with Chest pains, dizziness and shortness of breath            Action/Plan:  Pt is from home with husband.  CM will continue to monitor for disposition needs   Expected Discharge Date:  02/12/15               Expected Discharge Plan:  Home/Self Care  In-House Referral:     Discharge planning Services  CM Consult  Post Acute Care Choice:    Choice offered to:     DME Arranged:    DME Agency:     HH Arranged:    HH Agency:     Status of Service:   Complete, will sign off  Medicare Important Message Given:  N/A - LOS <3 / Initial given by admissions Date Medicare IM Given:    Medicare IM give by:    Date Additional Medicare IM Given:    Additional Medicare Important Message give by:     Disposition: Home/Self Care  If discussed at Long Length of Stay Meetings, dates discussed:    Additional Comments:  Maryclare Labrador, RN 02/13/2015, 8:55 PM

## 2015-02-17 ENCOUNTER — Encounter: Payer: Self-pay | Admitting: Internal Medicine

## 2015-02-17 ENCOUNTER — Ambulatory Visit (INDEPENDENT_AMBULATORY_CARE_PROVIDER_SITE_OTHER): Payer: 59 | Admitting: Internal Medicine

## 2015-02-17 VITALS — BP 131/79 | HR 92 | Temp 98.4°F | Resp 20 | Ht 64.5 in | Wt 190.1 lb

## 2015-02-17 DIAGNOSIS — Z833 Family history of diabetes mellitus: Secondary | ICD-10-CM | POA: Diagnosis not present

## 2015-02-17 DIAGNOSIS — Z8249 Family history of ischemic heart disease and other diseases of the circulatory system: Secondary | ICD-10-CM | POA: Diagnosis not present

## 2015-02-17 DIAGNOSIS — Z09 Encounter for follow-up examination after completed treatment for conditions other than malignant neoplasm: Secondary | ICD-10-CM | POA: Insufficient documentation

## 2015-02-17 DIAGNOSIS — Z823 Family history of stroke: Secondary | ICD-10-CM

## 2015-02-17 DIAGNOSIS — Z841 Family history of disorders of kidney and ureter: Secondary | ICD-10-CM | POA: Diagnosis not present

## 2015-02-17 LAB — LIPID PANEL
Cholesterol: 265 mg/dL — ABNORMAL HIGH (ref 0–200)
HDL: 61 mg/dL (ref >=46)
LDL CALC: 168 mg/dL — AB (ref 0–99)
Total CHOL/HDL Ratio: 4.3 Ratio
Triglycerides: 182 mg/dL — ABNORMAL HIGH (ref <150)
VLDL: 36 mg/dL (ref 0–40)

## 2015-02-17 NOTE — Assessment & Plan Note (Signed)
Patient was admitted with atypical chest pain and was ruled out for cardiac causes. The patient did not have full risks education labs done at that time. The patient has been chest free since that time. Hemoglobin A1c was 5.9 counseling regarding changes in diet and exercise were made to prevent progression to diabetes. -Fasting lipid panel to calculate a CVD risk -Patient takes intermittent aspirin -We'll defer cardiology stress testing at this time per patient request

## 2015-02-17 NOTE — Patient Instructions (Signed)
General Instructions:   Please bring your medicines with you each time you come to clinic.  Medicines may include prescription medications, over-the-counter medications, herbal remedies, eye drops, vitamins, or other pills.   We will get the cholesterol levels today and we will call you if they are abnormal.   We recommend that you follow up after 3 months for a thyroid check.    Progress Toward Treatment Goals:  No flowsheet data found.  Self Care Goals & Plans:  No flowsheet data found.  No flowsheet data found.   Care Management & Community Referrals:  No flowsheet data found.

## 2015-02-17 NOTE — Progress Notes (Signed)
Subjective:   Patient ID: Mandy Holmes female   DOB: 05-28-1951 64 y.o.   MRN: [id number]  HPI: Ms.Mandy Holmes is a 64 y.o. woman with a past medical history as below who presents for hospital follow-up.  Patient was discharged on 02/12/15 for chest painOriginal workup was negative. The patient planned on pursuing outpatient stress testing and risk stratification once establishing primary care.   Since her hospital position the patient states that her chest pain has completely resolved. She denies any symptoms of dryness of breath, dyspnea on exertion, lower extremity edema, or headaches.  The patient would like to establish care here. 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 Other surgeries have included a left knee arthroscopy and appendectomy.  In terms for family history her mother had a stroke and A. fib and her father had diabetes and peripheral artery disease. She has 2 brothers and a twin sister that have coronary artery disease diabetes and end-stage renal disease. She has 2 children that are in good health.  She works as a Environmental consultant at the ENT office she is a nonsmoker and only sparingly drinks once or twice per year and lives with her husband and reports feeling safe.   Past Medical History  Diagnosis Date  . Hypothyroidism   . Venous insufficiency     bilateral lower legs  . Headache(784.0)     migraines  . Cancer     endometrial   Current Outpatient Prescriptions  Medication Sig Dispense Refill  . Ascorbic Acid (VITAMIN C) 1000 MG tablet Take 3,000 mg by mouth daily.    . calcium citrate-vitamin D 500-400 MG-UNIT chewable tablet Chew 1 tablet by mouth daily.    . Cyanocobalamin (VITAMIN B-12 PO) Take 5,000 mcg by mouth daily.    Marland Kitchen estradiol (VIVELLE-DOT) 0.05 MG/24HR patch Place 1 patch (0.05 mg total) onto the skin 2 (two) times a week. (Patient taking differently:  Place 1 patch onto the skin 2 (two) times a week. Places on Wednesdays and Sundays) 8 patch 12  . EVENING PRIMROSE OIL PO Take 500 mg by mouth 2 (two) times daily.     . Iodine Strong, Lugols, (IODINE STRONG PO) Take 12.5 mg by mouth daily.    Marland Kitchen levothyroxine (SYNTHROID, LEVOTHROID) 75 MCG tablet Take 75 mcg by mouth daily before breakfast.    . MAGNESIUM GLYCINATE PLUS PO Take 400 mg by mouth daily.     . Multiple Vitamin (MULTIVITAMIN) tablet Take 1 tablet by mouth daily.    . Omega-3 Fatty Acids (FISH OIL PO) Take 1,400 mg by mouth daily.     . Probiotic Product (PROBIOTIC DAILY PO) Take 1 capsule by mouth 2 (two) times daily.     No current facility-administered medications for this visit.   Family History  Problem Relation Age of Onset  . Uterine cancer Mother   . Melanoma Brother    History   Social History  . Marital Status: Married    Spouse Name: N/A  . Number of Children: N/A  . Years of Education: N/A   Social History Main Topics  . Smoking status: Former Smoker -- 0.25 packs/day for 1 years    Types: Cigarettes    Quit date: 11/29/1969  . Smokeless tobacco: Never Used  . Alcohol Use: Yes     Comment: socially  . Drug Use: No  . Sexual Activity: Not on file  Other Topics Concern  . None   Social History Narrative   Review of Systems: Pertinent items are noted in HPI. Objective:  Physical Exam: Filed Vitals:   02/17/15 0838  BP: 131/79  Pulse: 92  Temp: 98.4 F (36.9 C)  TempSrc: Oral  Resp: 20  Height: 5' 4.5" (1.638 m)  Weight: 190 lb 1.6 oz (86.229 kg)  SpO2: 98%   General:sitting in chair, NAD  HEENT: PERRL, EOMI, no scleral icterus Cardiac: RRR, no rubs, murmurs or gallops Pulm: clear to auscultation bilaterally, moving normal volumes of air Abd: soft, nontender, nondistended, BS present Ext: warm and well perfused, no pedal edema Neuro: alert and oriented X3, cranial nerves II-XII grossly intact  Assessment & Plan:  Please see problem  oriented charting  Pt discussed with Dr. Dareen Piano

## 2015-02-17 NOTE — Progress Notes (Signed)
INTERNAL MEDICINE TEACHING ATTENDING ADDENDUM - Mandy Lapinski, MD: I reviewed and discussed at the time of visit with the resident Dr. Sadek, the patient's medical history, physical examination, diagnosis and results of pertinent tests and treatment and I agree with the patient's care as documented.  

## 2015-02-18 ENCOUNTER — Telehealth: Payer: Self-pay | Admitting: Internal Medicine

## 2015-02-18 MED ORDER — ATORVASTATIN CALCIUM 10 MG PO TABS: 10.0000 mg | ORAL_TABLET | Freq: Every day | ORAL | Status: DC

## 2015-02-18 NOTE — Telephone Encounter (Signed)
  Reason for call:   I placed an outgoing call to Ms. [name] at 1145  AM regarding her updated lab results. She was informed of her elevated LDL levels and ACVD risk of 6.4% and that recommendations are a moderate-intensity statin. The patient was informed of any side effects and all her questions were answered.    Assessment/ Plan:   Prescription for atorvastatin 10mg  qd and pt will start taking ASA 81mg  daily  As always, pt is advised that if symptoms worsen or new symptoms arise, they should go to an urgent care facility or to to ER for further evaluation.   Jerrye Noble, MD   02/18/2015, 11:45 AM

## 2015-05-12 ENCOUNTER — Other Ambulatory Visit: Payer: Self-pay | Admitting: Orthopaedic Surgery

## 2015-05-12 DIAGNOSIS — M545 Low back pain: Secondary | ICD-10-CM

## 2015-05-18 ENCOUNTER — Telehealth: Payer: Self-pay | Admitting: Internal Medicine

## 2015-05-18 NOTE — Telephone Encounter (Signed)
Call to patient to confirm appointment for 05/19/15 at  8:15 lmtcb

## 2015-05-19 ENCOUNTER — Encounter: Payer: Self-pay | Admitting: Internal Medicine

## 2015-05-19 ENCOUNTER — Ambulatory Visit (INDEPENDENT_AMBULATORY_CARE_PROVIDER_SITE_OTHER): Payer: 59 | Admitting: Internal Medicine

## 2015-05-19 VITALS — BP 140/80 | HR 76 | Temp 98.2°F | Ht 65.0 in | Wt 186.8 lb

## 2015-05-19 DIAGNOSIS — Z9223 Personal history of estrogen therapy: Secondary | ICD-10-CM

## 2015-05-19 DIAGNOSIS — R5382 Chronic fatigue, unspecified: Secondary | ICD-10-CM | POA: Diagnosis not present

## 2015-05-19 DIAGNOSIS — E039 Hypothyroidism, unspecified: Secondary | ICD-10-CM | POA: Diagnosis not present

## 2015-05-19 DIAGNOSIS — Z23 Encounter for immunization: Secondary | ICD-10-CM

## 2015-05-19 DIAGNOSIS — Z8639 Personal history of other endocrine, nutritional and metabolic disease: Secondary | ICD-10-CM | POA: Insufficient documentation

## 2015-05-19 DIAGNOSIS — Z0000 Encounter for general adult medical examination without abnormal findings: Secondary | ICD-10-CM | POA: Insufficient documentation

## 2015-05-19 NOTE — Assessment & Plan Note (Addendum)
-   Will give prevnar today - She is due for Tdap booster. Will give on next visit - Will get DEXA scan for osteoporosis screening - Patient with a 10 year ASCVD risk score of 7.3% and does not wish to take atorvastatin at this time. Will d/c and monitor

## 2015-05-19 NOTE — Assessment & Plan Note (Signed)
-   Patient with a history of severe hot flashes - She follows with her Gyn doctor for this and was started on estrogen patches which relieved her symptoms - She will follow up with Gyn - Will check DEXA scan given estrogen deficiency and age- increased risk for osteoprosis

## 2015-05-19 NOTE — Assessment & Plan Note (Signed)
-   Patient with hypothyroidism on synthroid 75 mcg - Will check TSH today - She was taking lugols iodine - prescribed by outside practitioner. Advised her to discontinue this

## 2015-05-19 NOTE — Patient Instructions (Signed)
-   It was a pleasure meeting you today - We will check your blood work today - We will give you the pneumonia vaccine today - We will try to schedule the DEXA scan for you to screen for osteoporosis - Please stop taking Lugols iodine and evening primrose for now.

## 2015-05-19 NOTE — Progress Notes (Signed)
   Subjective:    Patient ID: Mandy Holmes, female    DOB: 03/09/1951, 64 y.o.   MRN: [id number]  HPI  Patient seen and examined. She is here for follow up of her hypothyroidism.  Of note, she also complains of fatigue. States she has no energy and this has been going on since she had a hysterectomy last year. No fevers, no cough, no n/v, no diarrhea, normal PO intake, no melena/hematemesis. She also is on an estrogen patch prescribed by her Gyn doctor for hot flashes  Review of Systems  Constitutional: Positive for fatigue. Negative for fever, chills, activity change, appetite change and unexpected weight change.  HENT: Negative.   Eyes: Negative.   Respiratory: Negative.   Cardiovascular: Negative.   Gastrointestinal: Negative.   Endocrine: Negative.   Musculoskeletal:       Complains of pain in bilateral LE- shooting in nature, over the back of her calves, occurring intermittently with exertion  Skin: Negative.   Neurological: Negative.   Psychiatric/Behavioral: Negative.        Objective:   Physical Exam  Constitutional: She is oriented to person, place, and time. She appears well-developed and well-nourished.  HENT:  Head: Normocephalic and atraumatic.  Eyes: Conjunctivae are normal.  Neck: Normal range of motion.  Cardiovascular: Normal rate, regular rhythm and normal heart sounds.   Pulmonary/Chest: Effort normal and breath sounds normal. No respiratory distress. She has no wheezes. She has no rales.  Abdominal: Soft. Bowel sounds are normal. She exhibits no distension. There is no tenderness.  Musculoskeletal: Normal range of motion. She exhibits no edema or tenderness.  Neurological: She is alert and oriented to person, place, and time.  Skin: Skin is warm and dry.  Psychiatric: She has a normal mood and affect. Her behavior is normal.          Assessment & Plan:  Please see problem based charting for assessment and plan:

## 2015-05-19 NOTE — Assessment & Plan Note (Signed)
-   Patient complains of fatigue over the last year since she had her hysterectomy. No fevers, no weight loss, normal PO intake, no abd pain, no n/v, no melena, no diarrhea - Will check CBC, TSH - She also takes mag supplements over the counter. Will check magnesium level

## 2015-05-20 LAB — CBC WITH DIFFERENTIAL/PLATELET
Basophils Absolute: 0.1 10*3/uL (ref 0.0–0.2)
Basos: 1 %
EOS (ABSOLUTE): 0.3 10*3/uL (ref 0.0–0.4)
EOS: 5 %
HEMATOCRIT: 42.9 % (ref 34.0–46.6)
Hemoglobin: 14.3 g/dL (ref 11.1–15.9)
Immature Grans (Abs): 0 10*3/uL (ref 0.0–0.1)
Immature Granulocytes: 0 %
Lymphocytes Absolute: 1.4 10*3/uL (ref 0.7–3.1)
Lymphs: 21 %
MCH: 29.6 pg (ref 26.6–33.0)
MCHC: 33.3 g/dL (ref 31.5–35.7)
MCV: 89 fL (ref 79–97)
MONOS ABS: 0.5 10*3/uL (ref 0.1–0.9)
Monocytes: 9 %
NEUTROS ABS: 4.1 10*3/uL (ref 1.4–7.0)
Neutrophils: 64 %
Platelets: 319 10*3/uL (ref 150–379)
RBC: 4.83 x10E6/uL (ref 3.77–5.28)
RDW: 13.4 % (ref 12.3–15.4)
WBC: 6.4 10*3/uL (ref 3.4–10.8)

## 2015-05-20 LAB — MAGNESIUM: MAGNESIUM: 2.2 mg/dL (ref 1.6–2.3)

## 2015-05-20 LAB — TSH: TSH: 1.94 u[IU]/mL (ref 0.450–4.500)

## 2015-06-16 ENCOUNTER — Ambulatory Visit
Admission: RE | Admit: 2015-06-16 | Discharge: 2015-06-16 | Disposition: A | Discharge status: Home / Self Care | Payer: 59 | Source: Ambulatory Visit | Attending: Orthopaedic Surgery | Admitting: Orthopaedic Surgery

## 2015-06-16 DIAGNOSIS — M545 Low back pain: Secondary | ICD-10-CM

## 2015-06-17 ENCOUNTER — Other Ambulatory Visit: Payer: Self-pay | Admitting: Internal Medicine

## 2015-06-17 DIAGNOSIS — Z8639 Personal history of other endocrine, nutritional and metabolic disease: Secondary | ICD-10-CM

## 2015-07-15 ENCOUNTER — Ambulatory Visit: Payer: 59 | Attending: Gynecologic Oncology | Admitting: Gynecologic Oncology

## 2015-07-15 ENCOUNTER — Encounter: Payer: Self-pay | Admitting: Gynecologic Oncology

## 2015-07-15 VITALS — BP 152/77 | HR 87 | Temp 97.7°F | Resp 18 | Ht 65.0 in | Wt 184.4 lb

## 2015-07-15 DIAGNOSIS — C541 Malignant neoplasm of endometrium: Secondary | ICD-10-CM | POA: Insufficient documentation

## 2015-07-15 DIAGNOSIS — Z6830 Body mass index (BMI) 30.0-30.9, adult: Secondary | ICD-10-CM | POA: Insufficient documentation

## 2015-07-15 DIAGNOSIS — N951 Menopausal and female climacteric states: Secondary | ICD-10-CM | POA: Diagnosis not present

## 2015-07-15 DIAGNOSIS — E669 Obesity, unspecified: Secondary | ICD-10-CM | POA: Insufficient documentation

## 2015-07-15 NOTE — Patient Instructions (Signed)
Follow-up with her new gynecologist in 6 months and either follow-up with Dr. Alycia Rossetti or Lenna Sciara cross in one year. Please let us know if you have any issues weaning down on your estrogen patch.

## 2015-07-15 NOTE — Progress Notes (Signed)
Follow Up Note: Gyn-Onc  Mandy Holmes 64 y.o. female  CC:  Chief Complaint  Patient presents with  . endometrial cancer    follow up    HPI:  Mandy Holmes is a 64 year old female, LMP 10 years ago, who had been on the Vivelle-Dot and progesterone for hormonal therapy since 2001. She reported vaginal bleeding in 01/2012. Evaluation at that time was notable for benign weakly proliferative endometrium with no atypia or hyperplasia. She did well until 9 months ago when she noted irregular bleeding. She presented for evaluation and an endometrial biopsy 10/22/2013 was notable for endometrial adenocarcinoma FIGO grade 1 and a benign endocervical polyp. The uterus sounded to 7 cm.  Her family history is notable for mother diagnosed with uterine cancer at age 24 and a brother with melanoma.   On 12/03/13, she underwent a total robotic hysterectomy, bilateral salpingo-oophorectomy, bilateral pelvic lymph node dissection.  Her post-operative course was uneventful.  Final pathology revealed: 1. 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 - CERVIX: BENIGN SQUAMOUS MUCOSA AND ENDOCERVICAL MUCOSA, NO DYSPLASIA OR MALIGNANCY. - BILATERAL OVARIES AND FALLOPIAN TUBES: BENIGN OVARIAN TISSUE AND FALLOPIAN TUBAL TISSUE, NO EVIDENCE OF MALIGNANCY. 2. Lymph nodes, regional resection, right pelvic - SIX LYMPH NODES, NEGATIVE FOR METASTATIC CARCINOMA (0/6). 3. Lymph nodes, regional resection, left pelvic - THREE LYMPH NODES, NEGATIVE FOR METASTATIC CARCINOMA (0/3).   Interval History:   She's overall been doing fairly well. She had episode of chest pain in May. It was felt to be most likely esophageal spasm. She's had no other issues that she is having increasing heartburn. She continues on her estrogenic patch. She was seen by Dr. Harrington Challenger in [place] had a negative exam and negative Pap smear. She's gained 9 pounds over  this past year. She is due for mammogram this Coming week and will also have her first bone density study. She's not having any hot flashes. She does complain of some vaginal dryness and discomfort with intercourse for which she and her husband uses lubrication. She did purchase a recumbent bike for exercise but just hasn't really started using it yet. She believes that her thyroid replacement therapy may be a little bit too low she'll be addressing this with her other physicians.  Review of Systems  Constitutional: No hot flashes Skin: C/o pale moles Cardiovascular: No chest pain, + increased heart burn Pulmonary: No cough or SOB Gastro Intestinal: No nausea, vomiting, constipation, or diarrhea reported. No bright red blood per rectum or change in bowel movement.  Genitourinary: Denies vaginal bleeding and discharge. No change in bladder habits, + vaginal dryness with intercourse Psychology: No changes MSK: Legs feel tired, had negative back MRI.  Current Meds:  Outpatient Encounter Prescriptions as of 07/15/2015  Medication Sig  . Ascorbic Acid (VITAMIN C) 1000 MG tablet Take 3,000 mg by mouth daily.  . calcium citrate-vitamin D 500-400 MG-UNIT chewable tablet Chew 1 tablet by mouth daily.  . Cyanocobalamin (VITAMIN B-12 PO) Take 5,000 mcg by mouth daily.  Marland Kitchen estradiol (VIVELLE-DOT) 0.05 MG/24HR patch Place 1 patch (0.05 mg total) onto the skin 2 (two) times a week. (Patient taking differently: Place 1 patch onto the skin 2 (two) times a week. Places on Wednesdays and Sundays)  . levothyroxine (SYNTHROID, LEVOTHROID) 75 MCG tablet Take 75 mcg by mouth daily before breakfast.  . MAGNESIUM GLYCINATE PLUS PO Take 400 mg by mouth daily.   Marland Kitchen  Omega-3 Fatty Acids (FISH OIL PO) Take 1,400 mg by mouth daily.   . Probiotic Product (PROBIOTIC DAILY PO) Take 1 capsule by mouth 2 (two) times daily.  . Multiple Vitamin (MULTIVITAMIN) tablet Take 1 tablet by mouth daily.   No facility-administered  encounter medications on file as of 07/15/2015.    Allergy: No Known Allergies  Social Hx:   Social History   Social History  . Marital Status: Married    Spouse Name: N/A  . Number of Children: N/A  . Years of Education: N/A   Occupational History  . Not on file.   Social History Main Topics  . Smoking status: Former Smoker -- 0.25 packs/day for .5 years    Types: Cigarettes    Quit date: 11/29/1969  . Smokeless tobacco: Never Used  . Alcohol Use: 0.0 oz/week    0 Standard drinks or equivalent per week     Comment: socially  . Drug Use: No  . Sexual Activity: Yes   Other Topics Concern  . Not on file   Social History Narrative    Past Surgical Hx:  Past Surgical History  Procedure Laterality Date  . Tubal ligation    . Appendectomy    . Knee arthroscopy Left   . Robotic assisted total hysterectomy with bilateral salpingo oopherectomy N/A 12/03/2013    Procedure: ROBOTIC ASSISTED TOTAL HYSTERECTOMY WITH BILATERAL SALPINGO OOPHORECTOMY WITH BILATERAL LYMPH NODE DISSECTION;  Surgeon: Imagene Gurney A. Alycia Rossetti, MD;  Location: [facility];  Service: Gynecology;  Laterality: N/A;    Past Medical Hx:  Past Medical History  Diagnosis Date  . Hypothyroidism   . Venous insufficiency     bilateral lower legs  . Headache(784.0)     migraines  . Cancer [facility])     endometrial    Family Hx:  Family History  Problem Relation Age of Onset  . Uterine cancer Mother   . Melanoma Brother     Vitals:  Blood pressure 152/77, pulse 87, temperature 97.7 F (36.5 C), temperature source Oral, resp. rate 18, height 5\' 5"  (1.651 m), weight 184 lb 6.4 oz (83.643 kg), SpO2 98 %.  Physical Exam: General: Well developed, well nourished female in no acute distress. Alert and oriented x 3.   Neck: Supple, no lymphadenopathy, no thyromegaly.  Lungs: Clear to auscultation bilaterally  Cardiovascular: Regular rate and rhythm  Abdomen: Abdomen soft, non-tender and obese. Active bowel sounds in all  quadrants. No evidence of a fluid wave or abdominal masses. Incisions are well-healed  Pelvic: External genitalia within normal limits except for a slight red irritated areas on left labia minora. Small 4 mm skin tag on right vulva. Speculum examination reveals no visible lesions at the vaginal cuff.  Bimanual examination reveals no masses or nodularity. Rectal confirms.  Extremities: No bilateral cyanosis, edema, or clubbing.  Assessment/Plan:  64 year old with Stage IA endometrioid carcinoma s/p robotic hysterectomy, BSO, and node dissection on 12/03/13.  Final pathology was consistent with a stage IA grade 1 endometrioid adenocarcinoma with negative nodes and she requires no additional treatment.    She'll return to see your gynecologist in 6 months and will return to see [place] in 1 year. She will work on weaning down her estrogen.   Mandy Martorelli A., MD 07/15/2015, 2:43 PM

## 2015-07-21 ENCOUNTER — Ambulatory Visit
Admission: RE | Admit: 2015-07-21 | Discharge: 2015-07-21 | Disposition: A | Discharge status: Home / Self Care | Payer: 59 | Source: Ambulatory Visit | Attending: Internal Medicine | Admitting: Internal Medicine

## 2015-07-21 ENCOUNTER — Other Ambulatory Visit: Payer: Self-pay | Admitting: Gynecologic Oncology

## 2015-07-21 DIAGNOSIS — Z8639 Personal history of other endocrine, nutritional and metabolic disease: Secondary | ICD-10-CM

## 2015-07-21 DIAGNOSIS — C541 Malignant neoplasm of endometrium: Secondary | ICD-10-CM

## 2015-07-21 MED ORDER — ESTRADIOL 0.05 MG/24HR TD PTTW: 1.0000 | MEDICATED_PATCH | TRANSDERMAL | Status: DC

## 2015-07-21 NOTE — Progress Notes (Signed)
Refill per pt request.  Patient to begin weaning off her patch per Dr. Elenora Gamma recommendations.  Advised to call for any questions or concerns.

## 2015-10-29 MED FILL — DICLOFENAC SOD EC 75 MG TAB: 75 | 30 days supply | Qty: 60 | Fill #1

## 2015-11-20 DIAGNOSIS — N909 Noninflammatory disorder of vulva and perineum, unspecified: Secondary | ICD-10-CM | POA: Diagnosis not present

## 2015-11-20 DIAGNOSIS — N762 Acute vulvitis: Secondary | ICD-10-CM | POA: Diagnosis not present

## 2015-11-27 MED FILL — CLOBETASOL 0.05% OINTMENT: 0.05 | 10 days supply | Qty: 30 | Fill #0

## 2015-12-01 MED FILL — FLUCONAZOLE 150 MG TABLET: 150 | 2 days supply | Qty: 2 | Fill #0

## 2015-12-04 MED FILL — FLUCONAZOLE 150 MG TABLET: 150 | 1 days supply | Qty: 1 | Fill #0

## 2015-12-18 DIAGNOSIS — N760 Acute vaginitis: Secondary | ICD-10-CM | POA: Diagnosis not present

## 2015-12-22 ENCOUNTER — Ambulatory Visit (INDEPENDENT_AMBULATORY_CARE_PROVIDER_SITE_OTHER): Payer: 59 | Admitting: Internal Medicine

## 2015-12-22 ENCOUNTER — Encounter: Payer: Self-pay | Admitting: Internal Medicine

## 2015-12-22 VITALS — BP 135/83 | HR 91 | Temp 97.7°F | Ht 65.0 in | Wt 186.2 lb

## 2015-12-22 DIAGNOSIS — E039 Hypothyroidism, unspecified: Secondary | ICD-10-CM | POA: Diagnosis not present

## 2015-12-22 DIAGNOSIS — N941 Unspecified dyspareunia: Secondary | ICD-10-CM | POA: Diagnosis not present

## 2015-12-22 DIAGNOSIS — C55 Malignant neoplasm of uterus, part unspecified: Secondary | ICD-10-CM

## 2015-12-22 DIAGNOSIS — Z8542 Personal history of malignant neoplasm of other parts of uterus: Secondary | ICD-10-CM | POA: Diagnosis not present

## 2015-12-22 DIAGNOSIS — R5382 Chronic fatigue, unspecified: Secondary | ICD-10-CM

## 2015-12-22 DIAGNOSIS — Z90710 Acquired absence of both cervix and uterus: Secondary | ICD-10-CM

## 2015-12-22 NOTE — Assessment & Plan Note (Signed)
-   Patient followed up with gyn - Dr. Vanessa Kick - Had vaginal biopsy done which was normal per patient - Is currently on a steroid cream prescribed by her doctor - Will obtain records form gyn office - may benefit from estrogen cream

## 2015-12-22 NOTE — Progress Notes (Signed)
   Subjective:    Patient ID: Mandy Holmes, female    DOB: 02-12-1951, 65 y.o.   MRN: [id number]  HPI Patient here for routine follow up of her hypothyroidism.   She does complain of painful intercourse and vaginal burning since stopping her estrogen patch and complains of recurrent hot flashes. Was seen by her gynecologist and had a biopsy done which was normal. States she was prescribed a steroid cream post biopsy and may be prescribed an estrogen cream on follow up.    Review of Systems  Constitutional: Positive for fatigue. Negative for fever, chills, activity change and appetite change.  HENT: Negative.   Eyes: Negative.   Respiratory: Negative.   Cardiovascular: Negative.   Gastrointestinal: Negative.   Musculoskeletal: Negative.   Skin: Negative.   Neurological: Negative.   Psychiatric/Behavioral: Negative.        Objective:   Physical Exam  Constitutional: She is oriented to person, place, and time. She appears well-developed and well-nourished.  HENT:  Head: Normocephalic and atraumatic.  Neck: Normal range of motion. Neck supple. No thyromegaly present.  Cardiovascular: Normal rate, regular rhythm and normal heart sounds.   No murmur heard. Pulmonary/Chest: Effort normal and breath sounds normal. No respiratory distress. She has no wheezes.  Abdominal: Soft. Bowel sounds are normal. She exhibits no distension. There is no tenderness.  Musculoskeletal: Normal range of motion. She exhibits no edema.  Neurological: She is alert and oriented to person, place, and time.  Skin: Skin is warm and dry.  Psychiatric: She has a normal mood and affect. Her behavior is normal.          Assessment & Plan:  Please see problem based charting for assessment and plan:

## 2015-12-22 NOTE — Assessment & Plan Note (Signed)
-   Still complains of mild fatigue - CBC, TSH done on last visit was wnl - Will recheck TSH today - Will check BMP today

## 2015-12-22 NOTE — Assessment & Plan Note (Signed)
-   Patient with stage 1 A endometriod cancer s/p hysterectomy and BSO - She was seen by gyn/onc in October and will f/u again this year - No further treatment for now

## 2015-12-22 NOTE — Patient Instructions (Signed)
-   It was a pleasure seeing you today - We will check your thyroid function today - We will also check your kidney function - Please call with any questions\ - Please f/u with your gynaecologist

## 2015-12-22 NOTE — Assessment & Plan Note (Signed)
-   patient still complains of intermittently feeling cold and some fatigue - Patient was on Lugol's iodine (not prescribed here) which was discontinued on last visit - Last TSH was normal - Will recheck TSH today

## 2015-12-23 LAB — BMP8+ANION GAP
ANION GAP: 19 mmol/L — AB (ref 10.0–18.0)
BUN/Creatinine Ratio: 28 — ABNORMAL HIGH (ref 11–26)
BUN: 18 mg/dL (ref 8–27)
CO2: 20 mmol/L (ref 18–29)
CREATININE: 0.64 mg/dL (ref 0.57–1.00)
Calcium: 10.5 mg/dL — ABNORMAL HIGH (ref 8.7–10.3)
Chloride: 100 mmol/L (ref 96–106)
GFR calc Af Amer: 108 mL/min/{1.73_m2} (ref >59)
GFR calc non Af Amer: 93 mL/min/{1.73_m2} (ref >59)
Glucose: 109 mg/dL — ABNORMAL HIGH (ref 65–99)
Potassium: 4.3 mmol/L (ref 3.5–5.2)
Sodium: 139 mmol/L (ref 134–144)

## 2015-12-23 LAB — TSH: TSH: 1.23 u[IU]/mL (ref 0.450–4.500)

## 2015-12-28 ENCOUNTER — Telehealth: Payer: Self-pay | Admitting: Internal Medicine

## 2015-12-28 MED ORDER — LEVOTHYROXINE SODIUM 75 MCG PO TABS: 75.0000 ug | ORAL_TABLET | Freq: Every day | ORAL | Status: DC

## 2015-12-28 MED FILL — LEVOTHYROXINE 75 MCG TABLET: 75 | 90 days supply | Qty: 90 | Fill #0

## 2015-12-28 NOTE — Telephone Encounter (Signed)
Pt requesting levothyroxine to be filled @ outpatient pharmacy. Also requesting lab result. Please call pt back.

## 2015-12-28 NOTE — Telephone Encounter (Signed)
Wants lab results also

## 2015-12-29 NOTE — Telephone Encounter (Signed)
I spoke with her about her lab results yesterday. She is going to take her calcium tabs every other day now and I informed her that her TSH was normal

## 2016-01-07 DIAGNOSIS — G4730 Sleep apnea, unspecified: Secondary | ICD-10-CM | POA: Diagnosis not present

## 2016-01-20 DIAGNOSIS — G4733 Obstructive sleep apnea (adult) (pediatric): Secondary | ICD-10-CM | POA: Diagnosis not present

## 2016-01-20 DIAGNOSIS — J310 Chronic rhinitis: Secondary | ICD-10-CM | POA: Diagnosis not present

## 2016-02-09 DIAGNOSIS — G4733 Obstructive sleep apnea (adult) (pediatric): Secondary | ICD-10-CM | POA: Diagnosis not present

## 2016-03-11 DIAGNOSIS — G4733 Obstructive sleep apnea (adult) (pediatric): Secondary | ICD-10-CM | POA: Diagnosis not present

## 2016-03-23 MED FILL — LEVOTHYROXINE 75 MCG TABLET: 75 | 90 days supply | Qty: 90 | Fill #1

## 2016-03-28 DIAGNOSIS — Z01419 Encounter for gynecological examination (general) (routine) without abnormal findings: Secondary | ICD-10-CM | POA: Diagnosis not present

## 2016-03-28 DIAGNOSIS — Z6833 Body mass index (BMI) 33.0-33.9, adult: Secondary | ICD-10-CM | POA: Diagnosis not present

## 2016-03-28 DIAGNOSIS — Z8542 Personal history of malignant neoplasm of other parts of uterus: Secondary | ICD-10-CM | POA: Diagnosis not present

## 2016-03-28 DIAGNOSIS — Z124 Encounter for screening for malignant neoplasm of cervix: Secondary | ICD-10-CM | POA: Diagnosis not present

## 2016-03-28 MED FILL — LIDOCAINE 5% OINTMENT: 5 | 10 days supply | Qty: 30 | Fill #0

## 2016-04-10 DIAGNOSIS — G4733 Obstructive sleep apnea (adult) (pediatric): Secondary | ICD-10-CM | POA: Diagnosis not present

## 2016-04-28 ENCOUNTER — Telehealth: Payer: Self-pay

## 2016-04-28 MED FILL — ESTRACE 0.01% CREAM: 0.1 | 90 days supply | Qty: 43 | Fill #0

## 2016-04-28 NOTE — Telephone Encounter (Signed)
Orders received from [place] to contact the patient to follow up on left pain . Attempted to contact the patient yesterday July 19 , 2017 at 5:01 PM , no answer , left a detailed message with call number provided. Patient's call returned as she call back this afternoon, contacted the patient at her place of employment [employer]: [phone] . Patient states she had surgery on her "left" leg three years ago and continues to experience pain. Patient states she since then was seen by an orthopedic physician and had a "nerve" conduction test . Patient states she is not sure if she will return to him , she does not want another test , it was painful . Writer instructed the patient to follow through with either the orthopedic doctor or her PCP to have her left leg evaluated to be sure she does not have a clot. Patient stated understanding and if we would write for venlafaxine , she states she took it and it work . Patient states the prescription was two years old, Probation officer informed the patient that she she should not be taking medication that is over a year old. Patient informed we would not write a prescription for venlafaxine as this drug is not for pain. Again the patient was encouraged to seek evaluation for her left leg pain , patient thanked Probation officer for the call and hung up, [employer], APNP updated.

## 2016-05-11 DIAGNOSIS — G4733 Obstructive sleep apnea (adult) (pediatric): Secondary | ICD-10-CM | POA: Diagnosis not present

## 2016-05-13 IMAGING — CR DG CHEST 2V
2 series · 2 of 2 positions shown · non-contrast
Comparison: 11/29/2013.

CLINICAL DATA: 64-year-old female with left side sharp chest pain.
Hypertension. Nausea. Initial encounter.

EXAM:
CHEST  2 VIEW

[chest pa]
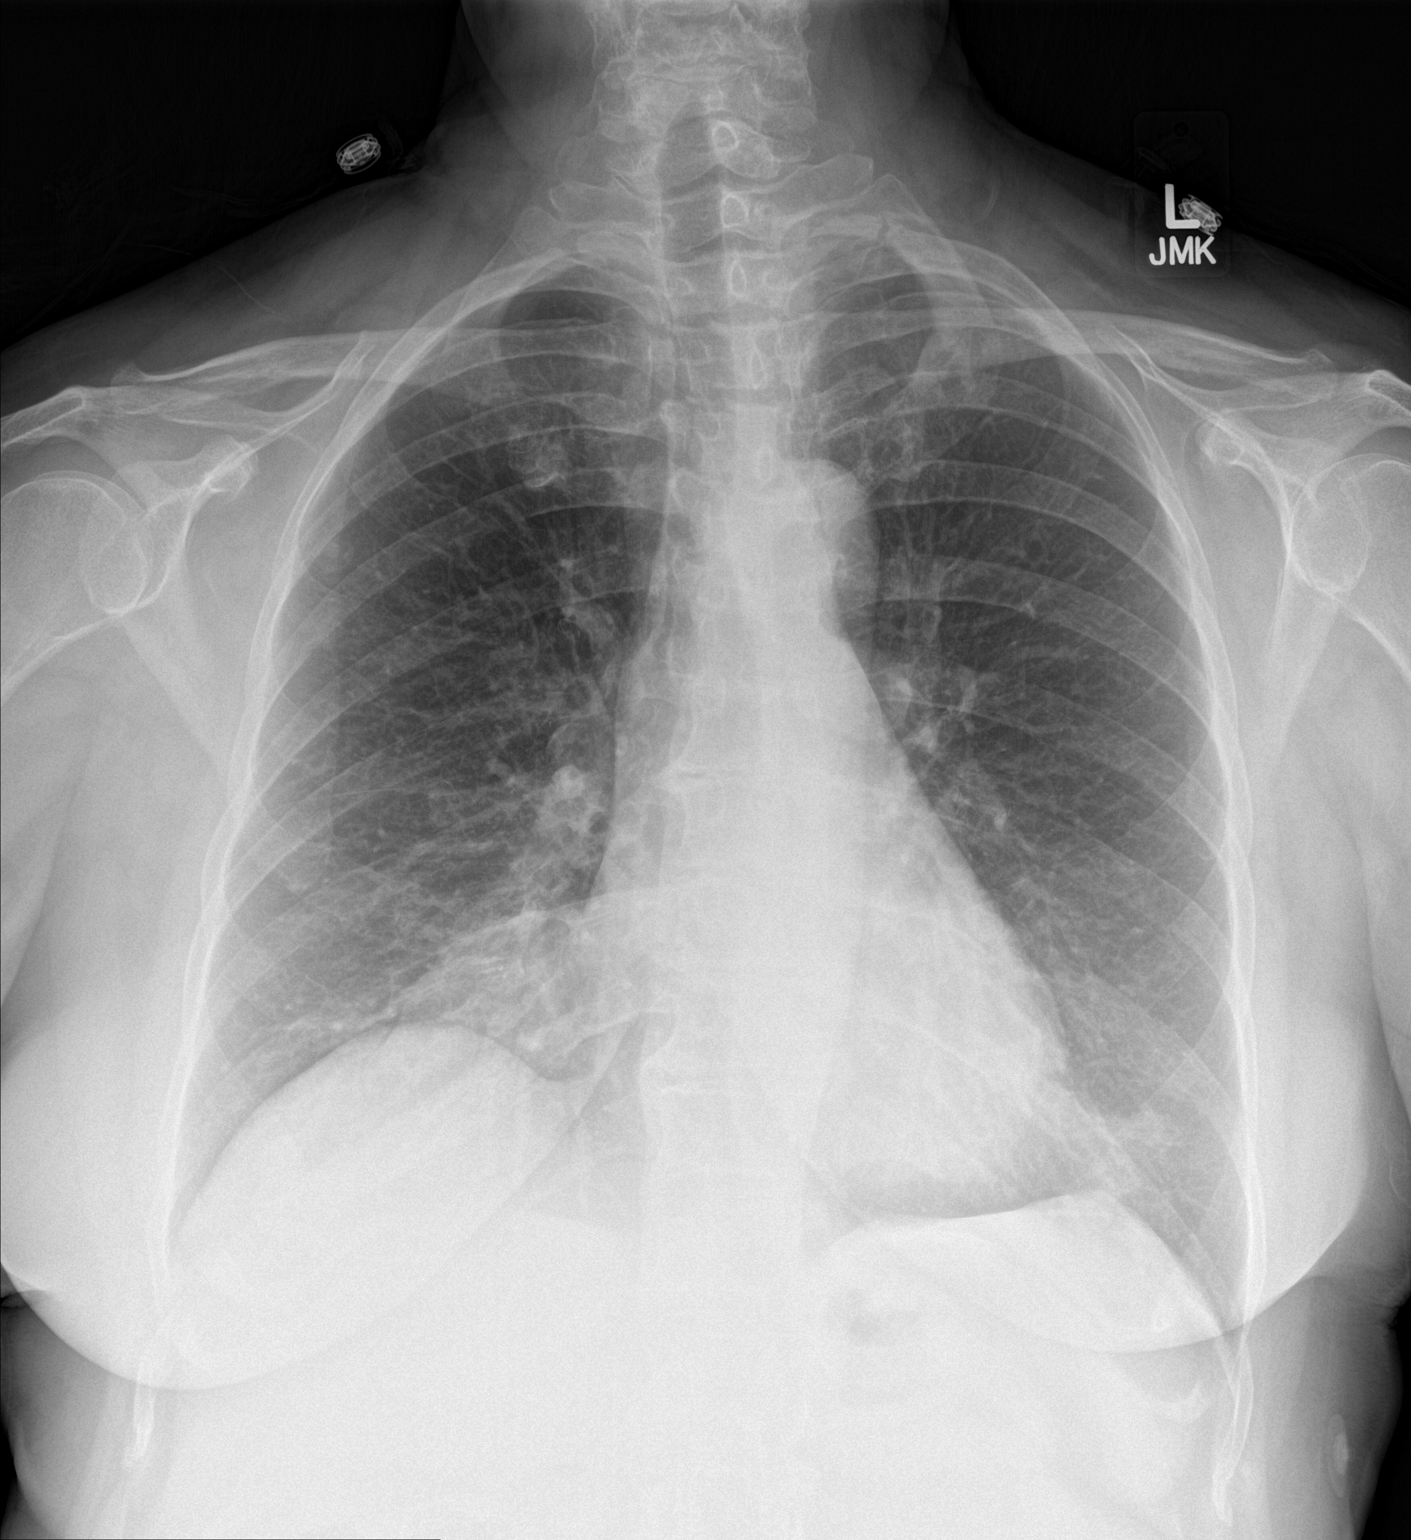

[chest lat]
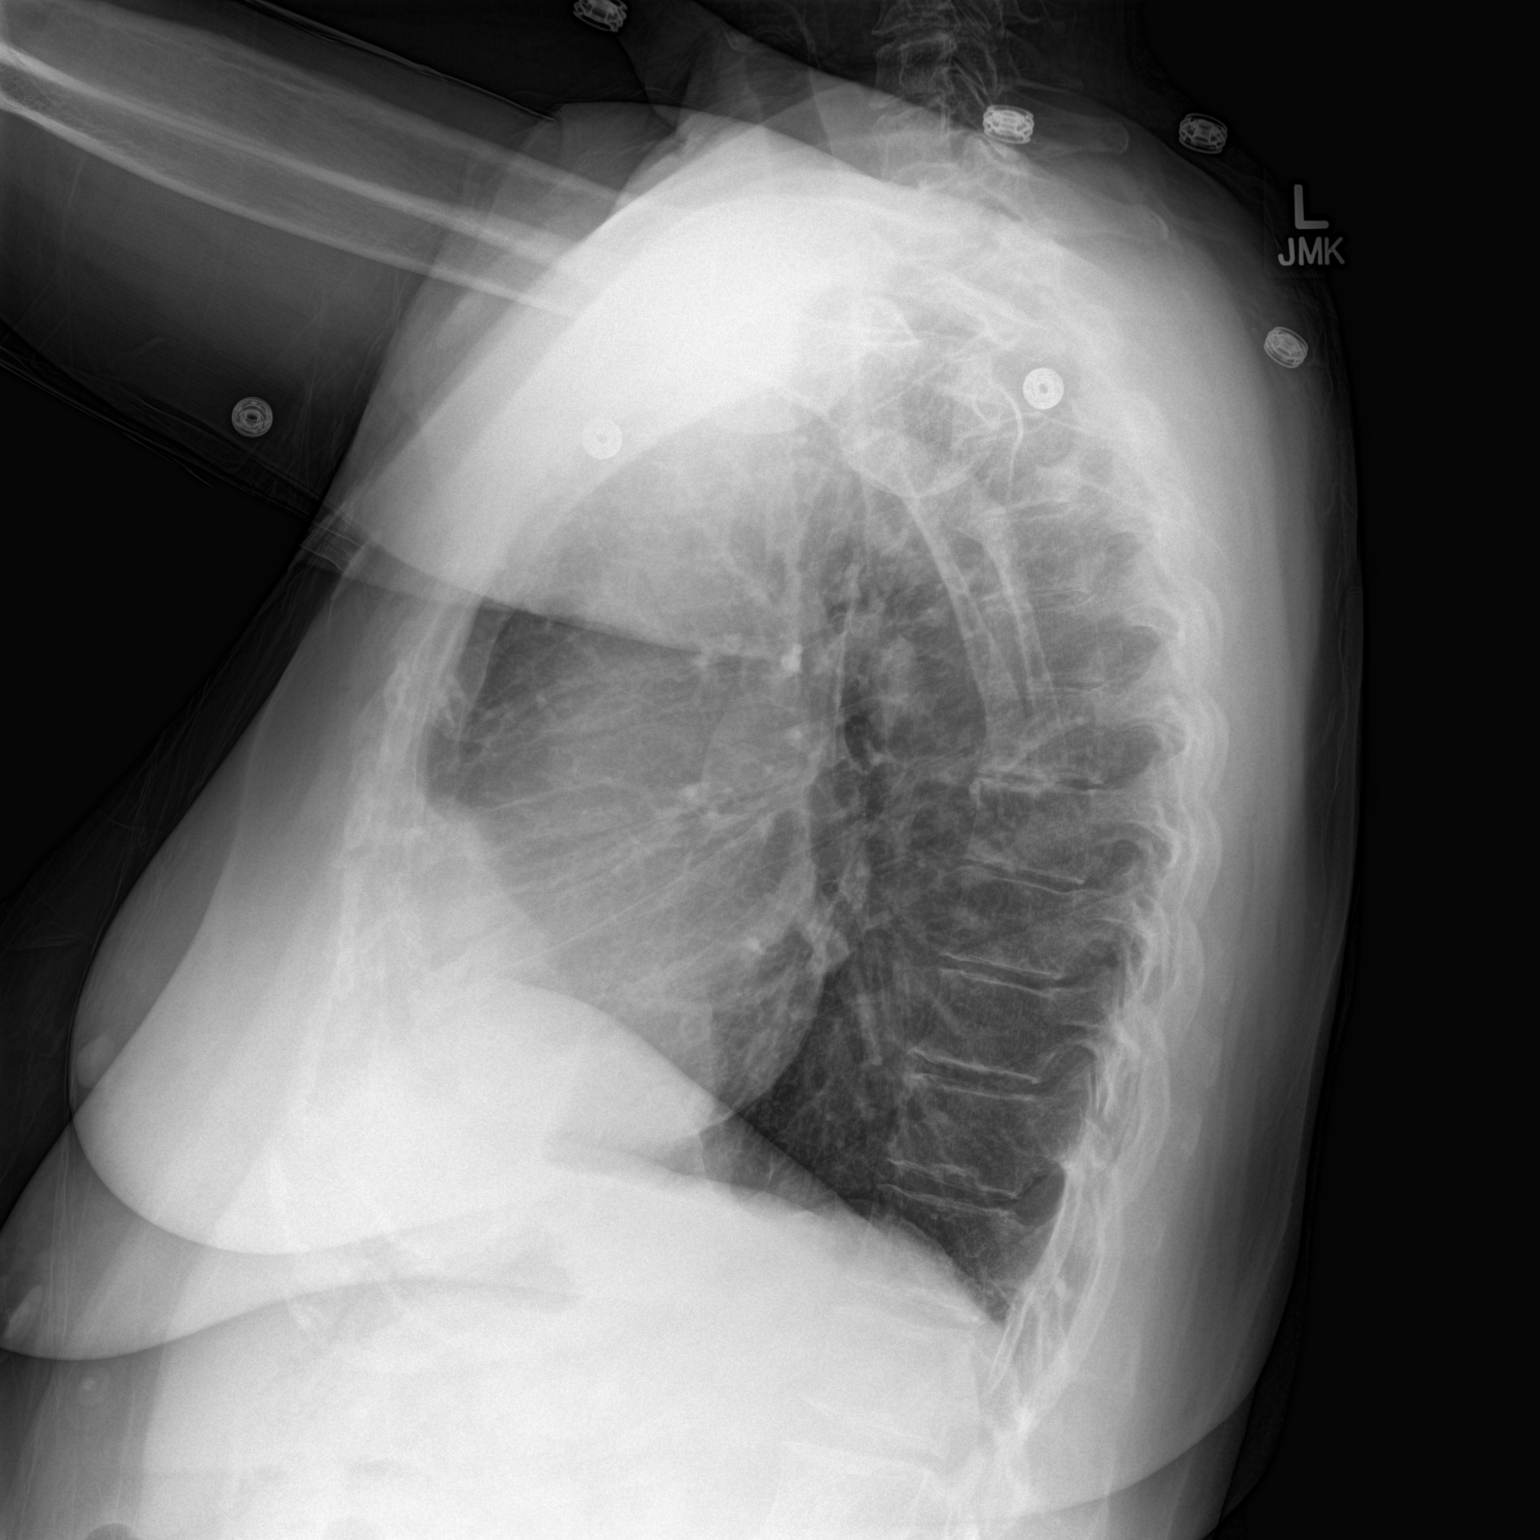

[2 of 2 positions shown; findings below may reference images not displayed]

FINDINGS: Stable lung volumes. Cardiac size is stable and at the upper limits
of normal. Other mediastinal contours are within normal limits.
Suspect epicardial lipomatosis responsible for the cardiophrenic
contour. No pneumothorax, pulmonary edema, pleural effusion or No
acute osseous abnormality identified. Visualized tracheal air column
is within normal limits. acute pulmonary opacity.
IMPRESSION: No acute cardiopulmonary abnormality.

## 2016-05-20 DIAGNOSIS — G4733 Obstructive sleep apnea (adult) (pediatric): Secondary | ICD-10-CM | POA: Diagnosis not present

## 2016-06-11 DIAGNOSIS — G4733 Obstructive sleep apnea (adult) (pediatric): Secondary | ICD-10-CM | POA: Diagnosis not present

## 2016-06-14 ENCOUNTER — Other Ambulatory Visit: Payer: Self-pay | Admitting: Obstetrics and Gynecology

## 2016-06-14 DIAGNOSIS — Z1231 Encounter for screening mammogram for malignant neoplasm of breast: Secondary | ICD-10-CM

## 2016-06-21 MED FILL — LEVOTHYROXINE 75 MCG TABLET: 75 | 90 days supply | Qty: 90 | Fill #2

## 2016-07-13 ENCOUNTER — Ambulatory Visit: Payer: 59 | Attending: Gynecologic Oncology | Admitting: Gynecologic Oncology

## 2016-07-13 ENCOUNTER — Encounter: Payer: Self-pay | Admitting: Gynecologic Oncology

## 2016-07-13 ENCOUNTER — Other Ambulatory Visit: Payer: Self-pay | Admitting: Gynecologic Oncology

## 2016-07-13 VITALS — BP 137/70 | HR 71 | Temp 97.9°F | Resp 18 | Ht 65.0 in | Wt 185.7 lb

## 2016-07-13 DIAGNOSIS — E039 Hypothyroidism, unspecified: Secondary | ICD-10-CM | POA: Diagnosis not present

## 2016-07-13 DIAGNOSIS — Z79899 Other long term (current) drug therapy: Secondary | ICD-10-CM | POA: Insufficient documentation

## 2016-07-13 DIAGNOSIS — Z87891 Personal history of nicotine dependence: Secondary | ICD-10-CM | POA: Diagnosis not present

## 2016-07-13 DIAGNOSIS — Z08 Encounter for follow-up examination after completed treatment for malignant neoplasm: Secondary | ICD-10-CM | POA: Insufficient documentation

## 2016-07-13 DIAGNOSIS — C541 Malignant neoplasm of endometrium: Secondary | ICD-10-CM

## 2016-07-13 DIAGNOSIS — Z90710 Acquired absence of both cervix and uterus: Secondary | ICD-10-CM | POA: Insufficient documentation

## 2016-07-13 DIAGNOSIS — Z90722 Acquired absence of ovaries, bilateral: Secondary | ICD-10-CM | POA: Diagnosis not present

## 2016-07-13 DIAGNOSIS — I872 Venous insufficiency (chronic) (peripheral): Secondary | ICD-10-CM | POA: Diagnosis not present

## 2016-07-13 MED ORDER — VENLAFAXINE HCL 37.5 MG PO TABS: 37.5000 mg | ORAL_TABLET | Freq: Two times a day (BID) | ORAL | 12 refills | Status: DC

## 2016-07-13 MED ORDER — VENLAFAXINE HCL 37.5 MG PO TABS: 37.5000 mg | ORAL_TABLET | Freq: Every day | ORAL | 12 refills | Status: DC

## 2016-07-13 MED FILL — ESTRACE 0.01% CREAM: 0.1 | 90 days supply | Qty: 43 | Fill #1

## 2016-07-13 MED FILL — VENLAFAXINE HCL 37.5 MG TAB: 37.5 | 30 days supply | Qty: 30 | Fill #0

## 2016-07-13 NOTE — Patient Instructions (Addendum)
Follow-up with her gynecologist in 6 months return to see [place] in one year. Make sure to give [place] a call in July to schedule your appointment for October.  Recommendation for GI doctor at [facility]: Dr. Rhea Belton or Dr. Leone Payor  Venlafaxine extended-release capsules What is this medicine? VENLAFAXINE(VEN la fax een) is used to treat depression, anxiety and panic disorder. This medicine may be used for other purposes; ask your health care provider or pharmacist if you have questions. What should I tell my health care provider before I take this medicine? They need to know if you have any of these conditions: -bleeding disorders -glaucoma -heart disease -high blood pressure -high cholesterol -kidney disease -liver disease -low levels of sodium in the blood -mania or bipolar disorder -seizures -suicidal thoughts, plans, or attempt; a previous suicide attempt by you or a family -take medicines that treat or prevent blood clots -thyroid disease -an unusual or allergic reaction to venlafaxine, desvenlafaxine, other medicines, foods, dyes, or preservatives -pregnant or trying to get pregnant -breast-feeding How should I use this medicine? Take this medicine by mouth with a full glass of water. Follow the directions on the prescription label. Do not cut, crush, or chew this medicine. Take it with food. If needed, the capsule may be carefully opened and the entire contents sprinkled on a spoonful of cool applesauce. Swallow the applesauce/pellet mixture right away without chewing and follow with a glass of water to ensure complete swallowing of the pellets. Try to take your medicine at about the same time each day. Do not take your medicine more often than directed. Do not stop taking this medicine suddenly except upon the advice of your doctor. Stopping this medicine too quickly may cause serious side effects or your condition may worsen. A special MedGuide will be given to you by the pharmacist with each  prescription and refill. Be sure to read this information carefully each time. Talk to your pediatrician regarding the use of this medicine in children. Special care may be needed. Overdosage: If you think you have taken too much of this medicine contact a poison control center or emergency room at once. NOTE: This medicine is only for you. Do not share this medicine with others. What if I miss a dose? If you miss a dose, take it as soon as you can. If it is almost time for your next dose, take only that dose. Do not take double or extra doses. What may interact with this medicine? Do not take this medicine with any of the following medications: -certain medicines for fungal infections like fluconazole, itraconazole, ketoconazole, posaconazole, voriconazole -cisapride -desvenlafaxine -dofetilide -dronedarone -duloxetine -levomilnacipran -linezolid -MAOIs like Carbex, Eldepryl, Marplan, Nardil, and Parnate -methylene blue (injected into a vein) -milnacipran -pimozide -thioridazine -ziprasidone This medicine may also interact with the following medications: -aspirin and aspirin-like medicines -certain medicines for depression, anxiety, or psychotic disturbances -certain medicines for migraine headaches like almotriptan, eletriptan, frovatriptan, naratriptan, rizatriptan, sumatriptan, zolmitriptan -certain medicines for sleep -certain medicines that treat or prevent blood clots like dalteparin, enoxaparin, warfarin -cimetidine -clozapine -diuretics -fentanyl -furazolidone -indinavir -isoniazid -lithium -metoprolol -NSAIDS, medicines for pain and inflammation, like ibuprofen or naproxen -other medicines that prolong the QT interval (cause an abnormal heart rhythm) -procarbazine -rasagiline -supplements like St. John's wort, kava kava, valerian -tramadol -tryptophan This list may not describe all possible interactions. Give your health care provider a list of all the medicines,  herbs, non-prescription drugs, or dietary supplements you use. Also tell them if you smoke,  drink alcohol, or use illegal drugs. Some items may interact with your medicine. What should I watch for while using this medicine? Tell your doctor if your symptoms do not get better or if they get worse. Visit your doctor or health care professional for regular checks on your progress. Because it may take several weeks to see the full effects of this medicine, it is important to continue your treatment as prescribed by your doctor. Patients and their families should watch out for new or worsening thoughts of suicide or depression. Also watch out for sudden changes in feelings such as feeling anxious, agitated, panicky, irritable, hostile, aggressive, impulsive, severely restless, overly excited and hyperactive, or not being able to sleep. If this happens, especially at the beginning of treatment or after a change in dose, call your health care professional. This medicine can cause an increase in blood pressure. Check with your doctor for instructions on monitoring your blood pressure while taking this medicine. You may get drowsy or dizzy. Do not drive, use machinery, or do anything that needs mental alertness until you know how this medicine affects you. Do not stand or sit up quickly, especially if you are an older patient. This reduces the risk of dizzy or fainting spells. Alcohol may interfere with the effect of this medicine. Avoid alcoholic drinks. Your mouth may get dry. Chewing sugarless gum, sucking hard candy and drinking plenty of water will help. Contact your doctor if the problem does not go away or is severe. What side effects may I notice from receiving this medicine? Side effects that you should report to your doctor or health care professional as soon as possible: -allergic reactions like skin rash, itching or hives, swelling of the face, lips, or tongue -breathing problems -changes in  vision -hallucination, loss of contact with reality -seizures -suicidal thoughts or other mood changes -trouble passing urine or change in the amount of urine -unusual bleeding or bruising Side effects that usually do not require medical attention (report to your doctor or health care professional if they continue or are bothersome): -change in sex drive or performance -constipation -increased sweating -loss of appetite -nausea -tremors -weight loss This list may not describe all possible side effects. Call your doctor for medical advice about side effects. You may report side effects to FDA at 1-800-FDA-1088. Where should I keep my medicine? Keep out of the reach of children. Store at a controlled temperature between 20 and 25 degrees C (68 degrees and 77 degrees F), in a dry place. Throw away any unused medicine after the expiration date. NOTE: This sheet is a summary. It may not cover all possible information. If you have questions about this medicine, talk to your doctor, pharmacist, or health care provider.    2016, [employer]vier/Gold Standard. (2013-04-23 12:46:03)

## 2016-07-13 NOTE — Addendum Note (Signed)
Addended by: Joylene John D on: 07/13/2016 01:33 PM   Modules accepted: Orders

## 2016-07-13 NOTE — Progress Notes (Signed)
Follow Up Note: Gyn-Onc  Mandy Holmes 65 y.o. female  CC:  Chief Complaint  Patient presents with  . endometrial cancer    follow up    HPI:  Mandy Holmes is a 65 year old female, LMP 10 years ago, who had been on the Vivelle-Dot and progesterone for hormonal therapy since 2001. She reported vaginal bleeding in 01/2012. Evaluation at that time was notable for benign weakly proliferative endometrium with no atypia or hyperplasia. She did well until 9 months ago when she noted irregular bleeding. She presented for evaluation and an endometrial biopsy 10/22/2013 was notable for endometrial adenocarcinoma FIGO grade 1 and a benign endocervical polyp. The uterus sounded to 7 cm.  Her family history is notable for mother diagnosed with uterine cancer at age 65 and a brother with melanoma.   On 12/03/13, she underwent a total robotic hysterectomy, bilateral salpingo-oophorectomy, bilateral pelvic lymph node dissection.  Her post-operative course was uneventful.  Final pathology revealed: 1. 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 - CERVIX: BENIGN SQUAMOUS MUCOSA AND ENDOCERVICAL MUCOSA, NO DYSPLASIA OR MALIGNANCY. - BILATERAL OVARIES AND FALLOPIAN TUBES: BENIGN OVARIAN TISSUE AND FALLOPIAN TUBAL TISSUE, NO EVIDENCE OF MALIGNANCY. 2. Lymph nodes, regional resection, right pelvic - SIX LYMPH NODES, NEGATIVE FOR METASTATIC CARCINOMA (0/6). 3. Lymph nodes, regional resection, left pelvic - THREE LYMPH NODES, NEGATIVE FOR METASTATIC CARCINOMA (0/3).   Interval History:   I last saw her in October 2016 at which time her exam was unremarkable. She had previously been seen by Dr. Harrington Challenger in June with a negative exam and negative Pap smear. She comes in today for follow-up. She continues to have issues with her left leg and has been seen by an orthopedist. She had an MRI of her back that showed disc disease.  She states she had EMGs performed. 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 She was started on gabapentin for about a month and it did not really help with her pain. She took a few of the Effexor tablets I had given her after her surgery to help with vasomotor symptoms and she states that help with her pain significantly. Voltaren also seems to help with her pain. She states that she feels unsteady on her left leg. It is difficult to lift it. With standing she feels pressure in that leg and she does use a cane to go upstairs. She feels like her balance is off. With regards to pelvic symptoms she was having some burning on the left side inserted using Estrace twice a week and that helped with her symptoms. She denies any bleeding. She does have stress urinary incontinence and she wears a pad. She declined a urogynecology evaluation as she states the [facility] patient. She is otherwise doing fairly well other no new medical problems and her family. She's having a mammogram done at the end of this month. She states she's never had a colonoscopy but she does endorse having previously done a mail in sample. She does not recall what the results were of that. She states she'll work on getting a colonoscopy scheduled.  Review of Systems  Constitutional: No hot flashes Skin: C/o pale moles Cardiovascular: No chest pain Pulmonary: No cough or SOB Gastro Intestinal: No nausea, vomiting, constipation, or diarrhea reported. No bright red blood per rectum or change in bowel movement.  Genitourinary:  Denies vaginal bleeding and discharge. No change in bladder habits, + vaginal dryness improved with Estrace Psychology: No changes MSK: Left leg feels week with symptoms as above.  Current Meds:  Outpatient Encounter Prescriptions as of 07/13/2016  Medication Sig  . Ascorbic Acid (VITAMIN C) 1000 MG tablet Take 3,000 mg by mouth daily.  . calcium  citrate-vitamin D 500-400 MG-UNIT chewable tablet Chew 1 tablet by mouth daily.  . Cyanocobalamin (VITAMIN B-12 PO) Take 5,000 mcg by mouth daily.  Marland Kitchen ESTRACE VAGINAL 0.1 MG/GM vaginal cream   . levothyroxine (SYNTHROID, LEVOTHROID) 75 MCG tablet Take 1 tablet (75 mcg total) by mouth daily before breakfast.  . MAGNESIUM GLYCINATE PLUS PO Take 400 mg by mouth daily.   . Multiple Vitamin (MULTIVITAMIN) tablet Take 1 tablet by mouth daily.  . Omega-3 Fatty Acids (FISH OIL PO) Take 1,400 mg by mouth daily.   . Probiotic Product (PROBIOTIC DAILY PO) Take 1 capsule by mouth 2 (two) times daily.  . [DISCONTINUED] estradiol (VIVELLE-DOT) 0.05 MG/24HR patch Place 1 patch (0.05 mg total) onto the skin 2 (two) times a week. Places on Wednesdays and Sundays   No facility-administered encounter medications on file as of 07/13/2016.     Allergy: No Known Allergies  Social Hx:   Social History   Social History  . Marital status: Married    Spouse name: N/A  . Number of children: N/A  . Years of education: N/A   Occupational History  . Not on file.   Social History Main Topics  . Smoking status: Former Smoker    Packs/day: 0.25    Years: 0.50    Types: Cigarettes    Quit date: 11/29/1969  . Smokeless tobacco: Never Used  . Alcohol use 0.0 oz/week     Comment: socially  . Drug use: No  . Sexual activity: Yes   Other Topics Concern  . Not on file   Social History Narrative  . No narrative on file    Past Surgical Hx:  Past Surgical History:  Procedure Laterality Date  . APPENDECTOMY    . KNEE ARTHROSCOPY Left   . ROBOTIC ASSISTED TOTAL HYSTERECTOMY WITH BILATERAL SALPINGO OOPHERECTOMY N/A 12/03/2013   Procedure: ROBOTIC ASSISTED TOTAL HYSTERECTOMY WITH BILATERAL SALPINGO OOPHORECTOMY WITH BILATERAL LYMPH NODE DISSECTION;  Surgeon: Imagene Gurney A. Alycia Rossetti, MD;  Location: [facility];  Service: Gynecology;  Laterality: N/A;  . TUBAL LIGATION      Past Medical Hx:  Past Medical History:   Diagnosis Date  . Cancer (HCC)    endometrial  . Headache(784.0)    migraines  . Hypothyroidism   . Venous insufficiency    bilateral lower legs    Family Hx:  Family History  Problem Relation Age of Onset  . Uterine cancer Mother   . Melanoma Brother     Vitals:  Blood pressure 137/70, pulse 71, temperature 97.9 F (36.6 C), temperature source Oral, resp. rate 18, height 5\' 5"  (1.651 m), weight 185 lb 11.2 oz (84.2 kg), SpO2 99 %.  Physical Exam: General: Well developed, well nourished female in no acute distress. Alert and oriented x 3.   Neck: Supple, no lymphadenopathy, no thyromegaly.  Lungs: Clear to auscultation bilaterally  Cardiovascular: Regular rate and rhythm  Abdomen: Abdomen soft, non-tender and obese. Active bowel sounds in all quadrants. No evidence of a fluid wave or abdominal masses. Incisions are well-healed  Pelvic: External genitalia within normal limits except for a slight red irritated areas on left labia minora.  Speculum examination reveals no visible lesions at the vaginal cuff.  Bimanual examination reveals no masses or nodularity. Rectal confirms.  Extremities: No bilateral cyanosis, edema, or clubbing.  Assessment/Plan:  65 year old with Stage IA endometrioid carcinoma s/p robotic hysterectomy, BSO, and node dissection on 12/03/13.  Final pathology was consistent with a stage IA grade 1 endometrioid adenocarcinoma with negative nodes and she requires no additional treatment.    She'll return to see your gynecologist in 6 months and will return to see [place] in 1 year.   I don't know the exact etiology of her left lower extremity weakness. I cannot see any result other than her MRI in the [facility] epic system. The MRI did show degenerative disease and I'm not sure that could be related to the sciatic nerve conduction deficit or not. I'm comfortable starting her on Effexor 37.5 mg as she states she took a few of those that she had left overnight helped with  her pain. If that helps with her pain she may benefit from physical therapy and she states she's been favoring her left leg. I will have to defer to her orthopedist.  We discussed the role of colonoscopy for colon cancer screening and she states she'll move forward with getting this scheduled.   Asuncion Tapscott A., MD 07/13/2016, 12:08 PM

## 2016-07-14 ENCOUNTER — Encounter: Payer: Self-pay | Admitting: Internal Medicine

## 2016-07-21 ENCOUNTER — Ambulatory Visit: Payer: 59

## 2016-07-21 ENCOUNTER — Ambulatory Visit
Admission: RE | Admit: 2016-07-21 | Discharge: 2016-07-21 | Disposition: A | Discharge status: Home / Self Care | Payer: 59 | Source: Ambulatory Visit | Attending: Obstetrics and Gynecology | Admitting: Obstetrics and Gynecology

## 2016-07-21 DIAGNOSIS — Z1231 Encounter for screening mammogram for malignant neoplasm of breast: Secondary | ICD-10-CM

## 2016-08-05 ENCOUNTER — Ambulatory Visit (AMBULATORY_SURGERY_CENTER): Payer: Self-pay

## 2016-08-05 VITALS — Ht 65.0 in | Wt 190.8 lb

## 2016-08-05 DIAGNOSIS — Z1211 Encounter for screening for malignant neoplasm of colon: Secondary | ICD-10-CM

## 2016-08-05 MED ORDER — SUPREP BOWEL PREP KIT 17.5-3.13-1.6 GM/177ML PO SOLN: 1.0000 | Freq: Once | ORAL | 0 refills | Status: AC

## 2016-08-05 NOTE — Progress Notes (Signed)
No allergies to eggs or soy No past problems with anesthesia No diet meds No home oxygen  Registered for emmi 

## 2016-08-08 MED FILL — VENLAFAXINE HCL 37.5 MG TAB: 37.5 | 30 days supply | Qty: 30 | Fill #1

## 2016-08-09 MED FILL — SUPREP BOWEL PREP KIT: 17.5-3.13-1 | 1 days supply | Qty: 354 | Fill #0

## 2016-08-16 ENCOUNTER — Ambulatory Visit (AMBULATORY_SURGERY_CENTER): Payer: 59 | Admitting: Internal Medicine

## 2016-08-16 ENCOUNTER — Encounter: Payer: Self-pay | Admitting: Internal Medicine

## 2016-08-16 VITALS — BP 139/77 | HR 66 | Temp 98.7°F | Resp 15 | Ht 65.0 in | Wt 185.0 lb

## 2016-08-16 DIAGNOSIS — Z1211 Encounter for screening for malignant neoplasm of colon: Secondary | ICD-10-CM | POA: Diagnosis present

## 2016-08-16 DIAGNOSIS — D125 Benign neoplasm of sigmoid colon: Secondary | ICD-10-CM

## 2016-08-16 DIAGNOSIS — D122 Benign neoplasm of ascending colon: Secondary | ICD-10-CM

## 2016-08-16 DIAGNOSIS — K639 Disease of intestine, unspecified: Secondary | ICD-10-CM | POA: Diagnosis not present

## 2016-08-16 DIAGNOSIS — Z1212 Encounter for screening for malignant neoplasm of rectum: Secondary | ICD-10-CM | POA: Diagnosis not present

## 2016-08-16 DIAGNOSIS — G4733 Obstructive sleep apnea (adult) (pediatric): Secondary | ICD-10-CM | POA: Diagnosis not present

## 2016-08-16 DIAGNOSIS — K6389 Other specified diseases of intestine: Secondary | ICD-10-CM | POA: Diagnosis not present

## 2016-08-16 DIAGNOSIS — K635 Polyp of colon: Secondary | ICD-10-CM

## 2016-08-16 MED ORDER — SODIUM CHLORIDE 0.9 % IV SOLN: 500.0000 mL | INTRAVENOUS | Status: DC

## 2016-08-16 NOTE — Progress Notes (Signed)
A/ox3 pleased with MAC, report to Penny RN 

## 2016-08-16 NOTE — Patient Instructions (Signed)
YOU HAD AN ENDOSCOPIC PROCEDURE TODAY AT [facility]:   Refer to the procedure report that was given to you for any specific questions about what was found during the examination.  If the procedure report does not answer your questions, please call your gastroenterologist to clarify.  If you requested that your care partner not be given the details of your procedure findings, then the procedure report has been included in a sealed envelope for you to review at your convenience later.  YOU SHOULD EXPECT: Some feelings of bloating in the abdomen. Passage of more gas than usual.  Walking can help get rid of the air that was put into your GI tract during the procedure and reduce the bloating. If you had a lower endoscopy (such as a colonoscopy or flexible sigmoidoscopy) you may notice spotting of blood in your stool or on the toilet paper. If you underwent a bowel prep for your procedure, you may not have a normal bowel movement for a few days.  Please Note:  You might notice some irritation and congestion in your nose or some drainage.  This is from the oxygen used during your procedure.  There is no need for concern and it should clear up in a day or so.  SYMPTOMS TO REPORT IMMEDIATELY:   Following lower endoscopy (colonoscopy or flexible sigmoidoscopy):  Excessive amounts of blood in the stool  Significant tenderness or worsening of abdominal pains  Swelling of the abdomen that is new, acute  Fever of 100F or higher   Following upper endoscopy (EGD)  Vomiting of blood or coffee ground material  New chest pain or pain under the shoulder blades  Painful or persistently difficult swallowing  New shortness of breath  Fever of 100F or higher  Black, tarry-looking stools  For urgent or emergent issues, a gastroenterologist can be reached at any hour by calling [phone].   DIET:  We do recommend a small meal at first, but then you may proceed to your regular diet.  Drink  plenty of fluids but you should avoid alcoholic beverages for 24 hours.  ACTIVITY:  You should plan to take it easy for the rest of today and you should NOT DRIVE or use heavy machinery until tomorrow (because of the sedation medicines used during the test).    FOLLOW UP: Our staff will call the number listed on your records the next business day following your procedure to check on you and address any questions or concerns that you may have regarding the information given to you following your procedure. If we do not reach you, we will leave a message.  However, if you are feeling well and you are not experiencing any problems, there is no need to return our call.  We will assume that you have returned to your regular daily activities without incident.  If any biopsies were taken you will be contacted by phone or by letter within the next 1-3 weeks.  Please call us at [phone] if you have not heard about the biopsies in 3 weeks.    SIGNATURES/CONFIDENTIALITY: You and/or your care partner have signed paperwork which will be entered into your electronic medical record.  These signatures attest to the fact that that the information above on your After Visit Summary has been reviewed and is understood.  Full responsibility of the confidentiality of this discharge information lies with you and/or your care-partner.     INFORMATION ON POLYPS,DIVERTICULOSIS ,AND HEMORRHOIDS GIVEN TO YOU  TODAY  AWAIT POLYP PATHOLOGY AND BIOPSIES PATHOLOGY FROM DR Hilarie Fredrickson

## 2016-08-16 NOTE — Op Note (Signed)
[facility] Patient Name: Mandy Holmes Procedure Date: 08/16/2016 2:53 PM MRN: [id number] Endoscopist: Jerene Bears , MD Age: 65 Referring MD:  Date of Birth: 06-20-1951 Gender: Female Account #: [id number] Procedure:                Colonoscopy Indications:              Screening for colorectal malignant neoplasm, This                            is the patient's first colonoscopy Medicines:                Monitored Anesthesia Care Procedure:                Pre-Anesthesia Assessment:                           - Prior to the procedure, a History and Physical                            was performed, and patient medications and                            allergies were reviewed. The patient's tolerance of                            previous anesthesia was also reviewed. The risks                            and benefits of the procedure and the sedation                            options and risks were discussed with the patient.                            All questions were answered, and informed consent                            was obtained. Prior Anticoagulants: The patient has                            taken no previous anticoagulant or antiplatelet                            agents. ASA Grade Assessment: III - A patient with                            severe systemic disease. After reviewing the risks                            and benefits, the patient was deemed in                            satisfactory condition to undergo the procedure.  After obtaining informed consent, the colonoscope                            was passed under direct vision. Throughout the                            procedure, the patient's blood pressure, pulse, and                            oxygen saturations were monitored continuously. The                            Model PCF-[id number]) scope was introduced                            through the anus and  advanced to the the cecum,                            identified by appendiceal orifice and ileocecal                            valve. The colonoscopy was performed without                            difficulty. The patient tolerated the procedure                            well. The quality of the bowel preparation was                            good. The ileocecal valve, appendiceal orifice, and                            rectum were photographed. Scope In: 3:09:02 PM Scope Out: 3:29:32 PM Scope Withdrawal Time: 0 hours 15 minutes 52 seconds  Total Procedure Duration: 0 hours 20 minutes 30 seconds  Findings:                 The digital rectal exam was normal.                           A 6 mm polyp was found in the ascending colon. The                            polyp was sessile. The polyp was removed with a                            cold snare. Resection and retrieval were complete.                           Multiple small and large-mouthed diverticula were                            found in the sigmoid colon and descending colon.  There was narrowing of the distal sigmoid colon in                            association with the diverticular opening.                           Localized inflammation characterized by erythema                            with nodularity was found in the distal sigmoid                            colon at a diverticular opening. Multiple biopsies                            were obtained with cold forceps for histology in a                            targeted manner at this location to exclude                            adenomatous change.                           Internal hemorrhoids were found during                            retroflexion. The hemorrhoids were medium-sized. Complications:            No immediate complications. Estimated Blood Loss:     Estimated blood loss was minimal. Impression:               - One 6 mm  polyp in the ascending colon, removed                            with a cold snare. Resected and retrieved.                           - Severe diverticulosis in the sigmoid colon and in                            the descending colon. There was narrowing of the                            colon in association with the diverticular opening                            in the distal sigmoid.                           - Localized inflammation with nodular mucosa was                            found in the distal sigmoid colon associated with a  diverticulum. Biopsied.                           - Internal hemorrhoids. Recommendation:           - Patient has a contact number available for                            emergencies. The signs and symptoms of potential                            delayed complications were discussed with the                            patient. Return to normal activities tomorrow.                            Written discharge instructions were provided to the                            patient.                           - Resume previous diet.                           - Continue present medications.                           - Await pathology results.                           - Repeat colonoscopy is recommended. The                            colonoscopy date will be determined after pathology                            results from today's exam become available for                            review. Jerene Bears, MD 08/16/2016 3:36:11 PM This report has been signed electronically.

## 2016-08-16 NOTE — Progress Notes (Signed)
Called to room to assist during endoscopic procedure.  Patient ID and intended procedure confirmed with present staff. Received instructions for my participation in the procedure from the performing physician.  

## 2016-08-17 ENCOUNTER — Telehealth: Payer: Self-pay

## 2016-08-17 NOTE — Telephone Encounter (Signed)
  Follow up Call-  Call back number 08/16/2016  Post procedure Call Back phone  # [phone]  Permission to leave phone message Yes  Some recent data might be hidden     Patient was called for follow up after her procedure on 08/16/2016. No answer at the number given for follow up phone call. A message was left on the answering machine.

## 2016-08-17 NOTE — Telephone Encounter (Signed)
  Follow up Call-  Call back number 08/16/2016  Post procedure Call Back phone  # [phone]  Permission to leave phone message Yes  Some recent data might be hidden    Patient was called for follow up after her procedure on 08/16/2016. No answer at the number given for follow up phone call. A message was left on the answering machine.

## 2016-08-22 ENCOUNTER — Encounter: Payer: Self-pay | Admitting: Internal Medicine

## 2016-09-05 MED FILL — VENLAFAXINE HCL 37.5 MG TAB: 37.5 | 30 days supply | Qty: 30 | Fill #2

## 2016-09-13 ENCOUNTER — Encounter: Payer: Self-pay | Admitting: *Deleted

## 2016-09-23 MED FILL — ESTRACE 0.01% CREAM: 0.1 | 90 days supply | Qty: 43 | Fill #2

## 2016-09-26 DIAGNOSIS — G4733 Obstructive sleep apnea (adult) (pediatric): Secondary | ICD-10-CM | POA: Diagnosis not present

## 2016-10-05 MED FILL — LEVOTHYROXINE 75 MCG TABLET: 75 | 90 days supply | Qty: 90 | Fill #3

## 2016-10-11 MED FILL — VENLAFAXINE HCL 37.5 MG TAB: 37.5 | 30 days supply | Qty: 30 | Fill #3

## 2016-11-14 MED FILL — VENLAFAXINE HCL 37.5 MG TAB: 37.5 | 30 days supply | Qty: 30 | Fill #4

## 2016-12-13 MED FILL — VENLAFAXINE HCL 37.5 MG TAB: 37.5 | 30 days supply | Qty: 30 | Fill #5

## 2016-12-28 ENCOUNTER — Encounter: Payer: Self-pay | Admitting: Internal Medicine

## 2016-12-28 ENCOUNTER — Ambulatory Visit (INDEPENDENT_AMBULATORY_CARE_PROVIDER_SITE_OTHER): Payer: 59 | Admitting: Internal Medicine

## 2016-12-28 DIAGNOSIS — R21 Rash and other nonspecific skin eruption: Secondary | ICD-10-CM | POA: Diagnosis not present

## 2016-12-28 MED ORDER — TRIAMCINOLONE ACETONIDE 0.1 % EX CREA: 1.0000 "application " | TOPICAL_CREAM | Freq: Two times a day (BID) | CUTANEOUS | 0 refills | Status: DC | PRN

## 2016-12-28 MED FILL — TRIAMCINOLONE 0.1% CREAM: 0.1 | 30 days supply | Qty: 30 | Fill #0

## 2016-12-28 NOTE — Progress Notes (Signed)
   CC: rash  HPI:  Mandy Holmes is a 66 y.o. female with past medical history outlined below here for rash 3 weeks. For the details of today's visit, please refer to the assessment and plan.  Past Medical History:  Diagnosis Date  . Cancer (HCC)    endometrial  . Constipation   . Headache(784.0)    migraines  . Hypothyroidism   . Migraine   . Sleep apnea    CPAP  . Venous insufficiency    bilateral lower legs    Review of Systems:  All pertinents listed in HPI, otherwise negative  Physical Exam:  Vitals:   12/28/16 1351  BP: (!) 145/79  Pulse: 79  Temp: 98 F (36.7 C)  TempSrc: Oral  SpO2: 99%  Weight: 187 lb 6.4 oz (85 kg)  Height: 5\' 5"  (1.651 m)    Constitutional: NAD, appears comfortable Cardiovascular: RRR Extremities: Warm and well perfused.  No edema.  Skin: Right leg with weeping vesicular rash with central eschar        Assessment & Plan:   See Encounters Tab for problem based charting.  Patient seen with Dr. Angelia Mould

## 2016-12-28 NOTE — Patient Instructions (Addendum)
Mandy Holmes,  It was a pleasure seeing you today. For your rash, please take benadryl by mouth as needed for itching and redness. Please do not put anything on the affected area for the next 2 days as I feel you may be reacting to the topical medicine you are using. If the rash persists after 2 days, you may use the steroid cream I prescribed two times daily for up to a week. If the rash persists or does not improve after that, please return to clinic for follow up. If you have any questions or concerns, call our clinic at [phone] or after hours call [phone] and ask for the internal medicine resident on call. Thank you!   - Dr. Philipp Ovens

## 2016-12-28 NOTE — Assessment & Plan Note (Addendum)
Patient first noticed a red bump on her leg about three weeks ago. The bump subsequently turned black and she developed red bumps surrounding the area. She describes the rash as very itchy. She denies pain. She denies any recent yard work or bug bites but says she does have a dog who sleeps in the bed with her. She says it is possible that the dog brought in spider or other bug that she was unaware of. She says the itching and redness are relieved with oral Benadryl. She takes the Benadryl prior to bed and when she wakes in the morning the rash is almost gone. The rash erupts after application of topical antibiotics and antihistamines. She reports sensitive skin and allergy to adhesives. On exam she has a weeping vesicular, circular rash with the black eschar at the center. Please see picture documented in today's progress note. I think patient likely has 2 separate processes occurring here. The central scar is likely from a healing bug bite. The surrounding vesicular rash is likely a contact dermatitis secondary to topical medication she's been applying. Instructed patient to apply nothing to the area for the next 2 days and to continue oral Benadryl when necessary for itching and redness. If the rash persist after cessation of topicals, she can apply triamcinolone twice a day for 1 week. Instructed patient to return to clinic if rash persists or does not improve. -- Cessation of OTC topical antihistamine and antibiotics  -- Triamcinolone BID prn if no improvement after 2 days -- Return precautions given -- If rash persists, patient may require biopsy

## 2016-12-30 NOTE — Progress Notes (Signed)
Internal Medicine Clinic Attending  I saw and evaluated the patient.  I personally confirmed the key portions of the history and exam documented by Dr. Guilloud and I reviewed pertinent patient test results.  The assessment, diagnosis, and plan were formulated together and I agree with the documentation in the resident's note.  

## 2017-01-04 ENCOUNTER — Other Ambulatory Visit: Payer: Self-pay | Admitting: Internal Medicine

## 2017-01-04 MED FILL — LEVOTHYROXINE 75 MCG TABLET: 75 | 90 days supply | Qty: 90 | Fill #0

## 2017-01-05 DIAGNOSIS — J029 Acute pharyngitis, unspecified: Secondary | ICD-10-CM | POA: Diagnosis not present

## 2017-01-05 MED FILL — AMOXICILLIN 500 MG CAPSULE: 500 | 7 days supply | Qty: 21 | Fill #0

## 2017-01-16 MED FILL — ESTRADIOL 0.1 MG/GM CRM: 0.1 | 90 days supply | Qty: 43 | Fill #3

## 2017-01-16 MED FILL — VENLAFAXINE HCL 37.5 MG TAB: 37.5 | 30 days supply | Qty: 30 | Fill #6

## 2017-02-14 MED FILL — VENLAFAXINE HCL 37.5 MG TAB: 37.5 | 30 days supply | Qty: 30 | Fill #7

## 2017-03-17 MED FILL — VENLAFAXINE HCL 37.5 MG TAB: 37.5 | 30 days supply | Qty: 30 | Fill #8

## 2017-03-30 DIAGNOSIS — Z6833 Body mass index (BMI) 33.0-33.9, adult: Secondary | ICD-10-CM | POA: Diagnosis not present

## 2017-03-30 DIAGNOSIS — Z01419 Encounter for gynecological examination (general) (routine) without abnormal findings: Secondary | ICD-10-CM | POA: Diagnosis not present

## 2017-03-30 DIAGNOSIS — C541 Malignant neoplasm of endometrium: Secondary | ICD-10-CM | POA: Diagnosis not present

## 2017-03-30 DIAGNOSIS — Z1272 Encounter for screening for malignant neoplasm of vagina: Secondary | ICD-10-CM | POA: Diagnosis not present

## 2017-04-03 MED FILL — ESTRADIOL 0.1 MG/GM CRM: 0.1 | 90 days supply | Qty: 43 | Fill #0

## 2017-04-04 ENCOUNTER — Ambulatory Visit (INDEPENDENT_AMBULATORY_CARE_PROVIDER_SITE_OTHER): Payer: 59 | Admitting: Internal Medicine

## 2017-04-04 ENCOUNTER — Encounter: Payer: Self-pay | Admitting: Internal Medicine

## 2017-04-04 VITALS — BP 143/72 | HR 79 | Temp 98.3°F | Ht 65.0 in | Wt 190.3 lb

## 2017-04-04 DIAGNOSIS — Z86010 Personal history of colonic polyps: Secondary | ICD-10-CM

## 2017-04-04 DIAGNOSIS — R5382 Chronic fatigue, unspecified: Secondary | ICD-10-CM

## 2017-04-04 DIAGNOSIS — E039 Hypothyroidism, unspecified: Secondary | ICD-10-CM

## 2017-04-04 DIAGNOSIS — Z0000 Encounter for general adult medical examination without abnormal findings: Secondary | ICD-10-CM | POA: Diagnosis not present

## 2017-04-04 DIAGNOSIS — Z79899 Other long term (current) drug therapy: Secondary | ICD-10-CM | POA: Diagnosis not present

## 2017-04-04 DIAGNOSIS — C55 Malignant neoplasm of uterus, part unspecified: Secondary | ICD-10-CM | POA: Diagnosis not present

## 2017-04-04 DIAGNOSIS — Z23 Encounter for immunization: Secondary | ICD-10-CM | POA: Diagnosis not present

## 2017-04-04 NOTE — Assessment & Plan Note (Signed)
-   Patient feels well with no new complaints - Will check TSH today - continue with current dose of her synthroid for now (75 mcg)

## 2017-04-04 NOTE — Progress Notes (Signed)
   Subjective:    Patient ID: Mandy Holmes, female    DOB: 01/21/1951, 66 y.o.   MRN: [id number]  HPI   Patient is here for routine follow up of her hypothyroidism and chronic fatigue. She states she feels well with no new complaints. She is still taking OTC supplements but has decreased the ones she used to take and is unsure of which ones she is taking now. She says she is here for a routine follow up   Review of Systems  Constitutional: Negative.   HENT: Negative.   Respiratory: Negative.   Cardiovascular: Negative.   Gastrointestinal: Negative.   Musculoskeletal: Positive for arthralgias. Negative for gait problem, myalgias and neck pain.  Neurological: Negative.   Psychiatric/Behavioral: Negative.        Objective:   Physical Exam  Constitutional: She is oriented to person, place, and time. She appears well-developed and well-nourished.  HENT:  Head: Normocephalic and atraumatic.  Mouth/Throat: No oropharyngeal exudate.  Neck: Neck supple.  Cardiovascular: Normal rate, regular rhythm and normal heart sounds.   Pulmonary/Chest: Effort normal and breath sounds normal. No respiratory distress. She has no wheezes.  Abdominal: Soft. Bowel sounds are normal. She exhibits no distension. There is no tenderness.  Musculoskeletal: Normal range of motion. She exhibits no edema.  Lymphadenopathy:    She has no cervical adenopathy.  Neurological: She is alert and oriented to person, place, and time.  Skin: Skin is warm. No rash noted. No erythema.  Psychiatric: She has a normal mood and affect. Her behavior is normal.          Assessment & Plan:  Please see problem based charting for assessment and plan:

## 2017-04-04 NOTE — Assessment & Plan Note (Signed)
-   patient still complains of mild fatigue but attributes this to her daughter and her grandchildren moving in with her - Will f/u TSH and BMP ( did have mild hypercalcemia on last BMP) - No further work up for now - Would consider CBC on next visit if this persists

## 2017-04-04 NOTE — Patient Instructions (Addendum)
-   It was a pleasure seeing you today - We will check your blood work today and give you your tetanus booster and pneumonia vaccine - Continue with your current thyroid medication for now - Please follow up in 6 months

## 2017-04-04 NOTE — Assessment & Plan Note (Signed)
-   Patient follows up with Dr. Harrington Challenger and saw her last week - She reports that she is doing well  - Will obtain records from Dr. Harrington Challenger

## 2017-04-04 NOTE — Addendum Note (Signed)
Addended by: Sander Nephew F on: 04/04/2017 09:24 AM   Modules accepted: Orders

## 2017-04-04 NOTE — Assessment & Plan Note (Signed)
-   Patient had a colonoscopy in November and was found to have a sessile polyp which was benign. Will need a repeat colonoscopy in 5 years - Will give Tdap and PPSV 23 vaccines today - Will order a lipid panel

## 2017-04-05 ENCOUNTER — Encounter: Payer: Self-pay | Admitting: Internal Medicine

## 2017-04-05 LAB — LIPID PANEL
CHOL/HDL RATIO: 4.2 ratio (ref 0.0–4.4)
Cholesterol, Total: 219 mg/dL — ABNORMAL HIGH (ref 100–199)
HDL: 52 mg/dL (ref >39)
LDL Calculated: 138 mg/dL — ABNORMAL HIGH (ref 0–99)
TRIGLYCERIDES: 143 mg/dL (ref 0–149)
VLDL Cholesterol Cal: 29 mg/dL (ref 5–40)

## 2017-04-05 LAB — BMP8+ANION GAP
ANION GAP: 17 mmol/L (ref 10.0–18.0)
BUN/Creatinine Ratio: 30 — ABNORMAL HIGH (ref 12–28)
BUN: 19 mg/dL (ref 8–27)
CALCIUM: 10.6 mg/dL — AB (ref 8.7–10.3)
CHLORIDE: 102 mmol/L (ref 96–106)
CO2: 23 mmol/L (ref 20–29)
Creatinine, Ser: 0.63 mg/dL (ref 0.57–1.00)
GFR calc Af Amer: 107 mL/min/{1.73_m2} (ref >59)
GFR, EST NON AFRICAN AMERICAN: 93 mL/min/{1.73_m2} (ref >59)
Glucose: 104 mg/dL — ABNORMAL HIGH (ref 65–99)
POTASSIUM: 5 mmol/L (ref 3.5–5.2)
Sodium: 142 mmol/L (ref 134–144)

## 2017-04-05 LAB — TSH: TSH: 1.14 u[IU]/mL (ref 0.450–4.500)

## 2017-04-07 MED FILL — LEVOTHYROXINE 75 MCG TABLET: 75 | 90 days supply | Qty: 90 | Fill #1

## 2017-04-10 DIAGNOSIS — G4733 Obstructive sleep apnea (adult) (pediatric): Secondary | ICD-10-CM | POA: Diagnosis not present

## 2017-04-14 MED FILL — VENLAFAXINE HCL 37.5 MG TAB: 37.5 | 30 days supply | Qty: 30 | Fill #9

## 2017-04-18 ENCOUNTER — Telehealth: Payer: Self-pay | Admitting: *Deleted

## 2017-04-18 NOTE — Telephone Encounter (Signed)
Returned the patient's call and scheduled the follow up appt for October. Patient aware

## 2017-05-15 MED FILL — VENLAFAXINE HCL 37.5 MG TAB: 37.5 | 30 days supply | Qty: 30 | Fill #10

## 2017-05-23 ENCOUNTER — Ambulatory Visit (INDEPENDENT_AMBULATORY_CARE_PROVIDER_SITE_OTHER): Payer: 59

## 2017-05-23 ENCOUNTER — Ambulatory Visit (INDEPENDENT_AMBULATORY_CARE_PROVIDER_SITE_OTHER): Payer: 59 | Admitting: Orthopaedic Surgery

## 2017-05-23 ENCOUNTER — Encounter (INDEPENDENT_AMBULATORY_CARE_PROVIDER_SITE_OTHER): Payer: Self-pay | Admitting: Orthopaedic Surgery

## 2017-05-23 VITALS — BP 162/87 | HR 91 | Ht 66.0 in | Wt 190.0 lb

## 2017-05-23 DIAGNOSIS — M25562 Pain in left knee: Secondary | ICD-10-CM

## 2017-05-23 DIAGNOSIS — M1712 Unilateral primary osteoarthritis, left knee: Secondary | ICD-10-CM

## 2017-05-23 DIAGNOSIS — M1711 Unilateral primary osteoarthritis, right knee: Secondary | ICD-10-CM | POA: Diagnosis not present

## 2017-05-23 DIAGNOSIS — G8929 Other chronic pain: Secondary | ICD-10-CM

## 2017-05-23 DIAGNOSIS — M25561 Pain in right knee: Secondary | ICD-10-CM

## 2017-05-23 MED ORDER — METHYLPREDNISOLONE ACETATE 40 MG/ML IJ SUSP
80.0000 mg | INTRAMUSCULAR | Status: AC | PRN
  Administered 2017-05-23: 80 mg

## 2017-05-23 MED ORDER — DICLOFENAC SODIUM 50 MG PO TBEC: 50.0000 mg | DELAYED_RELEASE_TABLET | Freq: Two times a day (BID) | ORAL | 1 refills | Status: DC

## 2017-05-23 MED ORDER — BUPIVACAINE HCL 0.5 % IJ SOLN
3.0000 mL | INTRAMUSCULAR | Status: AC | PRN
  Administered 2017-05-23: 3 mL via INTRA_ARTICULAR

## 2017-05-23 MED ORDER — LIDOCAINE HCL 1 % IJ SOLN
3.0000 mL | INTRAMUSCULAR | Status: AC | PRN
  Administered 2017-05-23: 3 mL

## 2017-05-23 MED FILL — DICLOFENAC SOD EC 50 MG TAB: 50 | 30 days supply | Qty: 60 | Fill #0

## 2017-05-23 NOTE — Progress Notes (Signed)
Office Visit Note   Patient: Mandy Holmes           Date of Birth: 1951/05/07           MRN: [id number] Visit Date: 05/23/2017              Requested by: Aldine Contes, MD [address] [place], [place] [address] PCP: Aldine Contes, MD   Assessment & Plan: Visit Diagnoses:  1. Unilateral primary osteoarthritis, right knee   2. Chronic pain of right knee   3. Chronic pain of left knee   4. Unilateral primary osteoarthritis, left knee     Plan:  #1: Corticosteroid injection to the left knee #2: Prescription for diclofenac 50 mg one by mouth twice a day #3: Call if she would like to have an injection to her right knee  Follow-Up Instructions: Return if symptoms worsen or fail to improve.   Orders:  Orders Placed This Encounter  Procedures  . Large Joint Injection/Arthrocentesis  . XR KNEE 3 VIEW RIGHT  . XR KNEE 3 VIEW LEFT   Meds ordered this encounter  Medications  . diclofenac (VOLTAREN) 50 MG EC tablet    Sig: Take 1 tablet (50 mg total) by mouth 2 (two) times daily.    Dispense:  60 tablet    Refill:  1    Order Specific Question:   Supervising Provider    Answer:   Garald Balding [[id number]]      Procedures: Large Joint Inj Date/Time: 05/23/2017 4:48 PM Performed by: Biagio Borg D Authorized by: Biagio Borg D   Consent Given by:  Patient Timeout: prior to procedure the correct patient, procedure, and site was verified   Indications:  Pain and joint swelling Location:  Knee Site:  R knee Prep: patient was prepped and draped in usual sterile fashion   Needle Size:  25 G Needle Length:  1.5 inches Approach:  Anteromedial Ultrasound Guidance: No   Fluoroscopic Guidance: No   Arthrogram: No   Medications:  80 mg methylPREDNISolone acetate 40 MG/ML; 3 mL bupivacaine 0.5 %; 3 mL lidocaine 1 % Aspiration Attempted: No   Patient tolerance:  Patient tolerated the procedure well with no immediate complications     Clinical  Data: No additional findings.   Subjective: Chief Complaint  Patient presents with  . Left Knee - Pain    Mrs. Mandy Holmes is a 74 y o that presents with Bilateral knee pain x months. She relates the pain has gotten worse and she gets physically sick. No injury, not diabetic  . Right Knee - Pain    Mandy Holmes is a very pleasant 66 year old white female, a [employer] employee, presents today with bilateral knee pain much worse on the right in comparison to the left. This is been an insidious onset of pain and discomfort. She has tried anti-inflammatories over-the-counter and is also used her husband's diclofenac 50 mg only once a day which did give her some relief. Otherwise she continues to have pain more in the right knee. After she comes home from work as well as it really starts to bother her. She does complain of swelling in the knee and also some mild grating sounds. Her left knee is minimal comparative to the right knee at this time. Seen today for evaluation.    Review of Systems  Constitutional: Positive for fatigue. Negative for chills and fever.  Eyes: Negative for itching.  Respiratory: Negative for chest tightness and shortness of breath.  Cardiovascular: Negative for chest pain, palpitations and leg swelling.  Gastrointestinal: Negative for blood in stool, constipation and diarrhea.  Musculoskeletal: Positive for joint swelling. Negative for back pain, neck pain and neck stiffness.  Neurological: Positive for weakness and headaches. Negative for dizziness and numbness.  Hematological: Does not bruise/bleed easily.  Psychiatric/Behavioral: Negative for sleep disturbance. The patient is not nervous/anxious.      Objective: Vital Signs: BP (!) 162/87   Pulse 91   Ht 5\' 6"  (1.676 m)   Wt 190 lb (86.2 kg)   BMI 30.67 kg/m   Physical Exam  Constitutional: She is oriented to person, place, and time. She appears well-developed and well-nourished.  HENT:  Head: Normocephalic and  atraumatic.  Eyes: Pupils are equal, round, and reactive to light. EOM are normal.  Pulmonary/Chest: Effort normal.  Neurological: She is alert and oriented to person, place, and time.  Skin: Skin is warm and dry.  Psychiatric: She has a normal mood and affect. Her behavior is normal. Judgment and thought content normal.    Ortho Exam  Today she has range of motion from near full extension to about 105 bilaterally. Knee reveals some effusion at this time but no warmth or erythema. She does have grating in the patellofemoral area with range of motion. She is tender over the joint lines and in the parapatellar area bilaterally.  Specialty Comments:  No specialty comments available.  Imaging: Xr Knee 3 View Left  Result Date: 05/23/2017 Three-view x-ray left knee reveals a bone-on-bone medial compartment OA with periarticular spurring off the lateral femoral condyles and MORE on the medial tibial plateau. Sclerosing is noted also in the medial tibial plateau more than the femoral condyles. She is  valgus.  Xr Knee 3 View Right  Result Date: 05/23/2017 Three-view x-ray of the right knee reveals bone-on-bone medial compartment OA with periarticular spurring more off both the medial lateral femoral condyle in comparison to the left medial lateral tibial plateau. She has marked sclerosing over the medial compartment.    PMFS History: Patient Active Problem List   Diagnosis Date Noted  . Rash and nonspecific skin eruption 12/28/2016  . Dyspareunia, female 12/22/2015  . Preventative health care 05/19/2015  . History of estrogen deficiency 05/19/2015  . Chronic fatigue 05/19/2015  . Hypothyroidism 05/19/2015  . Uterine cancer ([place]) 11/07/2013   Past Medical History:  Diagnosis Date  . Cancer (HCC)    endometrial  . Constipation   . Headache(784.0)    migraines  . Hypothyroidism   . Migraine   . Sleep apnea    CPAP  . Venous insufficiency    bilateral lower legs    Family  History  Problem Relation Age of Onset  . Uterine cancer Mother   . Melanoma Brother   . Colon cancer Neg Hx     Past Surgical History:  Procedure Laterality Date  . APPENDECTOMY    . KNEE ARTHROSCOPY Left   . ROBOTIC ASSISTED TOTAL HYSTERECTOMY WITH BILATERAL SALPINGO OOPHERECTOMY N/A 12/03/2013   Procedure: ROBOTIC ASSISTED TOTAL HYSTERECTOMY WITH BILATERAL SALPINGO OOPHORECTOMY WITH BILATERAL LYMPH NODE DISSECTION;  Surgeon: Imagene Gurney A. Alycia Rossetti, MD;  Location: [facility];  Service: Gynecology;  Laterality: N/A;  . TUBAL LIGATION     Social History   Occupational History  . Not on file.   Social History Main Topics  . Smoking status: Never Smoker  . Smokeless tobacco: Never Used  . Alcohol use 0.0 oz/week     Comment: socially  .  Drug use: No  . Sexual activity: Yes

## 2017-06-07 ENCOUNTER — Ambulatory Visit (INDEPENDENT_AMBULATORY_CARE_PROVIDER_SITE_OTHER): Payer: 59 | Admitting: Orthopedic Surgery

## 2017-06-07 ENCOUNTER — Encounter (INDEPENDENT_AMBULATORY_CARE_PROVIDER_SITE_OTHER): Payer: Self-pay | Admitting: Orthopedic Surgery

## 2017-06-07 VITALS — BP 145/89 | HR 93 | Ht 65.0 in | Wt 190.0 lb

## 2017-06-07 DIAGNOSIS — M1712 Unilateral primary osteoarthritis, left knee: Secondary | ICD-10-CM

## 2017-06-07 DIAGNOSIS — M1711 Unilateral primary osteoarthritis, right knee: Secondary | ICD-10-CM | POA: Diagnosis not present

## 2017-06-07 MED ORDER — BUPIVACAINE HCL 0.5 % IJ SOLN
3.0000 mL | INTRAMUSCULAR | Status: AC | PRN
  Administered 2017-06-07: 3 mL via INTRA_ARTICULAR

## 2017-06-07 MED ORDER — METHYLPREDNISOLONE ACETATE 40 MG/ML IJ SUSP
80.0000 mg | INTRAMUSCULAR | Status: AC | PRN
  Administered 2017-06-07: 80 mg

## 2017-06-07 MED ORDER — LIDOCAINE HCL 1 % IJ SOLN
3.0000 mL | INTRAMUSCULAR | Status: AC | PRN
  Administered 2017-06-07: 3 mL

## 2017-06-07 NOTE — Progress Notes (Signed)
Office Visit Note   Patient: Mandy Holmes           Date of Birth: Jul 30, 1951           MRN: [id number] Visit Date: 06/07/2017              Requested by: Aldine Contes, MD [address] [place], [place] [address] PCP: Aldine Contes, MD   Assessment & Plan: Visit Diagnoses:  1. Unilateral primary osteoarthritis, left knee   2. Unilateral primary osteoarthritis, right knee     Plan: #1: Corticosteroid injection to the left knee topics atraumatically #2: If she does not have good improvement then she can consider having a series of Visco supplementation but she will give [place] a call about that later.  Follow-Up Instructions: Return if symptoms worsen or fail to improve.   Orders:  No orders of the defined types were placed in this encounter.  No orders of the defined types were placed in this encounter.     Procedures: Large Joint Inj Date/Time: 06/07/2017 2:02 PM Performed by: Biagio Borg D Authorized by: Biagio Borg D   Consent Given by:  Patient Timeout: prior to procedure the correct patient, procedure, and site was verified   Indications:  Pain and joint swelling Location:  Knee Site:  L knee Prep: patient was prepped and draped in usual sterile fashion   Needle Size:  25 G Needle Length:  1.5 inches Approach:  Anteromedial Ultrasound Guidance: No   Fluoroscopic Guidance: No   Arthrogram: No   Medications:  80 mg methylPREDNISolone acetate 40 MG/ML; 3 mL bupivacaine 0.5 %; 3 mL lidocaine 1 % Aspiration Attempted: No   Patient tolerance:  Patient tolerated the procedure well with no immediate complications     Clinical Data: No additional findings.   Subjective: Chief Complaint  Patient presents with  . Follow-up    would like left knee injection    Mandy Holmes is a very pleasant 66 year old white female, a [employer] employee, presents today with knee pain much worse on the left. This is been an insidious onset of pain and  discomfort. She has tried anti-inflammatories over-the-counter and is also used her husband's diclofenac 50 mg only once a day which did give her some relief. Otherwise she continues to have pain previously more in the right knee. She had a corticosteroid injection to the right knee which was quite beneficial for her. She still has a little bit of pain but certainly much better than she had before. She comes in today requesting corticosteroid injection to the left knee.    Review of Systems  Constitutional: Positive for fatigue. Negative for chills and fever.  Eyes: Negative for itching.  Respiratory: Negative for chest tightness and shortness of breath.   Cardiovascular: Negative for chest pain, palpitations and leg swelling.  Gastrointestinal: Negative for blood in stool, constipation and diarrhea.  Musculoskeletal: Positive for joint swelling. Negative for back pain, neck pain and neck stiffness.  Neurological: Positive for weakness and headaches. Negative for dizziness and numbness.  Hematological: Does not bruise/bleed easily.  Psychiatric/Behavioral: Negative for sleep disturbance. The patient is not nervous/anxious.      Objective: Vital Signs: BP (!) 145/89 (BP Location: Left Arm, Patient Position: Sitting, Cuff Size: Normal)   Pulse 93   Ht 5\' 5"  (1.651 m)   Wt 190 lb (86.2 kg)   BMI 31.62 kg/m   Physical Exam  Constitutional: She is oriented to person, place, and time. She appears  well-developed and well-nourished.  HENT:  Head: Normocephalic and atraumatic.  Eyes: Pupils are equal, round, and reactive to light. EOM are normal.  Pulmonary/Chest: Effort normal.  Neurological: She is alert and oriented to person, place, and time.  Skin: Skin is warm and dry.  Psychiatric: She has a normal mood and affect. Her behavior is normal. Judgment and thought content normal.    Ortho Exam  Today she has range of motion from near full extension to about 105 bilaterally. Left knee  reveals some effusion at this time but no warmth or erythema. She does have grating in the patellofemoral area with range of motion. She is tender over the joint lines and in the parapatellar area left knee  Specialty Comments:  No specialty comments available.  Imaging: Xr Knee 3 View Left  Result Date: 05/23/2017 Three-view x-ray left knee reveals a bone-on-bone medial compartment OA with periarticular spurring off the lateral femoral condyles and MORE on the medial tibial plateau. Sclerosing is noted also in the medial tibial plateau more than the femoral condyles. She is  valgus.    PMFS History: Patient Active Problem List   Diagnosis Date Noted  . Rash and nonspecific skin eruption 12/28/2016  . Dyspareunia, female 12/22/2015  . Preventative health care 05/19/2015  . History of estrogen deficiency 05/19/2015  . Chronic fatigue 05/19/2015  . Hypothyroidism 05/19/2015  . Uterine cancer ([place]) 11/07/2013   Past Medical History:  Diagnosis Date  . Cancer (HCC)    endometrial  . Constipation   . Headache(784.0)    migraines  . Hypothyroidism   . Migraine   . Sleep apnea    CPAP  . Venous insufficiency    bilateral lower legs    Family History  Problem Relation Age of Onset  . Uterine cancer Mother   . Stroke Mother   . Melanoma Brother   . Heart disease Brother   . Diabetes Father   . Melanoma Father   . Colon cancer Neg Hx     Past Surgical History:  Procedure Laterality Date  . APPENDECTOMY    . KNEE ARTHROSCOPY Left   . ROBOTIC ASSISTED TOTAL HYSTERECTOMY WITH BILATERAL SALPINGO OOPHERECTOMY N/A 12/03/2013   Procedure: ROBOTIC ASSISTED TOTAL HYSTERECTOMY WITH BILATERAL SALPINGO OOPHORECTOMY WITH BILATERAL LYMPH NODE DISSECTION;  Surgeon: Imagene Gurney A. Alycia Rossetti, MD;  Location: [facility];  Service: Gynecology;  Laterality: N/A;  . TUBAL LIGATION     Social History   Occupational History  . Not on file.   Social History Main Topics  . Smoking status: Never Smoker  .  Smokeless tobacco: Never Used  . Alcohol use 0.0 oz/week     Comment: socially  . Drug use: No  . Sexual activity: Yes

## 2017-06-07 NOTE — Progress Notes (Deleted)
   Office Visit Note   Patient: Mandy Holmes           Date of Birth: 10-13-1950           MRN: [id number] Visit Date: 06/07/2017              Requested by: Aldine Contes, MD [address], [place], [place] [address] PCP: Aldine Contes, MD   Assessment & Plan: Visit Diagnoses: No diagnosis found.  Plan: ***  Follow-Up Instructions: No Follow-up on file.   Orders:  No orders of the defined types were placed in this encounter.  No orders of the defined types were placed in this encounter.     Procedures: No procedures performed   Clinical Data: No additional findings.   Subjective: No chief complaint on file.   HPI  Review of Systems  HENT: Negative.   Eyes: Negative.   Respiratory: Negative.   Cardiovascular: Positive for leg swelling.  Gastrointestinal: Negative.   Endocrine: Negative.   Genitourinary: Negative.   Musculoskeletal: Negative.   Skin: Negative.   Allergic/Immunologic: Negative.   Neurological: Positive for weakness.  Hematological: Bruises/bleeds easily.     Objective: Vital Signs: BP (!) 145/89 (BP Location: Left Arm, Patient Position: Sitting, Cuff Size: Normal)   Pulse 93   Ht 5\' 5"  (1.651 m)   Wt 190 lb (86.2 kg)   BMI 31.62 kg/m   Physical Exam  Ortho Exam  Specialty Comments:  No specialty comments available.  Imaging: No results found.   PMFS History: Patient Active Problem List   Diagnosis Date Noted  . Rash and nonspecific skin eruption 12/28/2016  . Dyspareunia, female 12/22/2015  . Preventative health care 05/19/2015  . History of estrogen deficiency 05/19/2015  . Chronic fatigue 05/19/2015  . Hypothyroidism 05/19/2015  . Uterine cancer ([place]) 11/07/2013   Past Medical History:  Diagnosis Date  . Cancer (HCC)    endometrial  . Constipation   . Headache(784.0)    migraines  . Hypothyroidism   . Migraine   . Sleep apnea    CPAP  . Venous insufficiency    bilateral lower legs      Family History  Problem Relation Age of Onset  . Uterine cancer Mother   . Melanoma Brother   . Colon cancer Neg Hx     Past Surgical History:  Procedure Laterality Date  . APPENDECTOMY    . KNEE ARTHROSCOPY Left   . ROBOTIC ASSISTED TOTAL HYSTERECTOMY WITH BILATERAL SALPINGO OOPHERECTOMY N/A 12/03/2013   Procedure: ROBOTIC ASSISTED TOTAL HYSTERECTOMY WITH BILATERAL SALPINGO OOPHORECTOMY WITH BILATERAL LYMPH NODE DISSECTION;  Surgeon: Imagene Gurney A. Alycia Rossetti, MD;  Location: [facility];  Service: Gynecology;  Laterality: N/A;  . TUBAL LIGATION     Social History   Occupational History  . Not on file.   Social History Main Topics  . Smoking status: Never Smoker  . Smokeless tobacco: Never Used  . Alcohol use 0.0 oz/week     Comment: socially  . Drug use: No  . Sexual activity: Yes

## 2017-06-08 ENCOUNTER — Other Ambulatory Visit: Payer: Self-pay | Admitting: Internal Medicine

## 2017-06-08 DIAGNOSIS — Z1231 Encounter for screening mammogram for malignant neoplasm of breast: Secondary | ICD-10-CM

## 2017-06-19 MED FILL — VENLAFAXINE HCL 37.5 MG TAB: 37.5 | 30 days supply | Qty: 30 | Fill #11

## 2017-07-03 MED FILL — LEVOTHYROXINE 75 MCG TABLET: 75 | 90 days supply | Qty: 90 | Fill #2

## 2017-07-06 MED FILL — ESTRADIOL 0.1 MG/GM CRM: 0.1 | 90 days supply | Qty: 43 | Fill #1

## 2017-07-13 ENCOUNTER — Other Ambulatory Visit: Payer: Self-pay | Admitting: Gynecologic Oncology

## 2017-07-13 DIAGNOSIS — C541 Malignant neoplasm of endometrium: Secondary | ICD-10-CM

## 2017-07-13 MED ORDER — VENLAFAXINE HCL 37.5 MG PO TABS: 37.5000 mg | ORAL_TABLET | Freq: Every day | ORAL | 6 refills | Status: DC

## 2017-07-13 MED FILL — VENLAFAXINE HCL 37.5 MG TAB: 37.5 | 30 days supply | Qty: 30 | Fill #0

## 2017-07-19 MED FILL — DICLOFENAC SOD EC 50 MG TAB: 50 | 30 days supply | Qty: 60 | Fill #1

## 2017-07-24 ENCOUNTER — Ambulatory Visit
Admission: RE | Admit: 2017-07-24 | Discharge: 2017-07-24 | Disposition: A | Discharge status: Home / Self Care | Payer: 59 | Source: Ambulatory Visit | Attending: Internal Medicine | Admitting: Internal Medicine

## 2017-07-24 DIAGNOSIS — Z1231 Encounter for screening mammogram for malignant neoplasm of breast: Secondary | ICD-10-CM | POA: Diagnosis not present

## 2017-07-24 DIAGNOSIS — G4733 Obstructive sleep apnea (adult) (pediatric): Secondary | ICD-10-CM | POA: Diagnosis not present

## 2017-08-02 ENCOUNTER — Encounter: Payer: Self-pay | Admitting: Gynecologic Oncology

## 2017-08-02 ENCOUNTER — Ambulatory Visit: Payer: 59 | Attending: Gynecologic Oncology | Admitting: Gynecologic Oncology

## 2017-08-02 VITALS — BP 152/84 | HR 76 | Temp 98.3°F | Resp 20 | Ht 65.0 in | Wt 189.0 lb

## 2017-08-02 DIAGNOSIS — N841 Polyp of cervix uteri: Secondary | ICD-10-CM | POA: Insufficient documentation

## 2017-08-02 DIAGNOSIS — I872 Venous insufficiency (chronic) (peripheral): Secondary | ICD-10-CM | POA: Diagnosis not present

## 2017-08-02 DIAGNOSIS — Z823 Family history of stroke: Secondary | ICD-10-CM | POA: Insufficient documentation

## 2017-08-02 DIAGNOSIS — Z79899 Other long term (current) drug therapy: Secondary | ICD-10-CM | POA: Diagnosis not present

## 2017-08-02 DIAGNOSIS — E039 Hypothyroidism, unspecified: Secondary | ICD-10-CM | POA: Diagnosis not present

## 2017-08-02 DIAGNOSIS — Z833 Family history of diabetes mellitus: Secondary | ICD-10-CM | POA: Diagnosis not present

## 2017-08-02 DIAGNOSIS — G4730 Sleep apnea, unspecified: Secondary | ICD-10-CM | POA: Insufficient documentation

## 2017-08-02 DIAGNOSIS — Z8542 Personal history of malignant neoplasm of other parts of uterus: Secondary | ICD-10-CM | POA: Diagnosis not present

## 2017-08-02 DIAGNOSIS — Z90722 Acquired absence of ovaries, bilateral: Secondary | ICD-10-CM | POA: Diagnosis not present

## 2017-08-02 DIAGNOSIS — Z9889 Other specified postprocedural states: Secondary | ICD-10-CM | POA: Insufficient documentation

## 2017-08-02 DIAGNOSIS — C541 Malignant neoplasm of endometrium: Secondary | ICD-10-CM | POA: Diagnosis present

## 2017-08-02 DIAGNOSIS — Z90710 Acquired absence of both cervix and uterus: Secondary | ICD-10-CM | POA: Insufficient documentation

## 2017-08-02 DIAGNOSIS — M170 Bilateral primary osteoarthritis of knee: Secondary | ICD-10-CM | POA: Insufficient documentation

## 2017-08-02 DIAGNOSIS — Z8049 Family history of malignant neoplasm of other genital organs: Secondary | ICD-10-CM | POA: Insufficient documentation

## 2017-08-02 DIAGNOSIS — G43909 Migraine, unspecified, not intractable, without status migrainosus: Secondary | ICD-10-CM | POA: Insufficient documentation

## 2017-08-02 DIAGNOSIS — Z808 Family history of malignant neoplasm of other organs or systems: Secondary | ICD-10-CM | POA: Diagnosis not present

## 2017-08-02 NOTE — Progress Notes (Signed)
Follow Up Note: Gyn-Onc  Mandy Holmes 66 y.o. female  CC:  Chief Complaint  Patient presents with  . Endometrial cancer [facility])    HPI:  Mandy Holmes is a 66 year old female, LMP 10 years ago, who had been on the Vivelle-Dot and progesterone for hormonal therapy since 2001. She reported vaginal bleeding in 01/2012. Evaluation at that time was notable for benign weakly proliferative endometrium with no atypia or hyperplasia. She did well until 9 months ago when she noted irregular bleeding. She presented for evaluation and an endometrial biopsy 10/22/2013 was notable for endometrial adenocarcinoma FIGO grade 1 and a benign endocervical polyp. The uterus sounded to 7 cm.  Her family history is notable for mother diagnosed with uterine cancer at age 23 and a brother with melanoma.   On 12/03/13, she underwent a total robotic hysterectomy, bilateral salpingo-oophorectomy, bilateral pelvic lymph node dissection.  Her post-operative course was uneventful.  Final pathology revealed: 1. 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 - CERVIX: BENIGN SQUAMOUS MUCOSA AND ENDOCERVICAL MUCOSA, NO DYSPLASIA OR MALIGNANCY. - BILATERAL OVARIES AND FALLOPIAN TUBES: BENIGN OVARIAN TISSUE AND FALLOPIAN TUBAL TISSUE, NO EVIDENCE OF MALIGNANCY. 2. Lymph nodes, regional resection, right pelvic - SIX LYMPH NODES, NEGATIVE FOR METASTATIC CARCINOMA (0/6). 3. Lymph nodes, regional resection, left pelvic - THREE LYMPH NODES, NEGATIVE FOR METASTATIC CARCINOMA (0/3).   Interval History:   I last saw her in October 2017 at which time her exam was unremarkable. She did see Dr. [place] Challenger 6 months ago with a negative exam. She had a colonoscopy last year and needs a repeat in 5 years as she had a polyp. She has some stress as her daughter and 3 grandchildren moved in with them. She has seen an orthopedist and has osteoarthritis in both  knees ultimately need any replacement surgery but she is not ready to do that at this time. She is up-to-date on her mammograms. There are no new medical problems and her family. She does want to wait herself off her Effexor. She's currently on 37.5 mg. She's taking 1 every other day.  Review of Systems  Constitutional: No hot flashes Skin: C/o pale moles Cardiovascular: No chest pain Pulmonary: No cough or SOB Gastro Intestinal: No nausea, vomiting, constipation, or diarrhea reported. No bright red blood per rectum or change in bowel movement.  Genitourinary: Denies vaginal bleeding and discharge. No change in bladder habits, + vaginal dryness improved with Estrace Psychology: No changes, stress MSK: As above  Current Meds:  Outpatient Encounter Prescriptions as of 08/02/2017  Medication Sig  . Ascorbic Acid (VITAMIN C) 1000 MG tablet Take 3,000 mg by mouth daily.  Marland Kitchen b complex vitamins tablet Take 1 tablet by mouth daily.  . calcium citrate-vitamin D 500-400 MG-UNIT chewable tablet Chew 1 tablet by mouth daily.  . Cyanocobalamin (VITAMIN B-12 PO) Take 5,000 mcg by mouth daily.  . diclofenac (VOLTAREN) 50 MG EC tablet Take 1 tablet (50 mg total) by mouth 2 (two) times daily.  Marland Kitchen ESTRACE VAGINAL 0.1 MG/GM vaginal cream   . L-LYSINE PO Take 1,000 mg by mouth.  . levothyroxine (SYNTHROID, LEVOTHROID) 75 MCG tablet TAKE 1 TABLET BY MOUTH DAILY BEFORE BREAKFAST.  Marland Kitchen MAGNESIUM GLYCINATE PLUS PO Take 400 mg by mouth daily.   . Multiple Vitamin (MULTIVITAMIN) tablet Take 1 tablet by mouth daily.  . Omega-3 Fatty Acids (FISH OIL PO) Take 1,400 mg by mouth daily.   Marland Kitchen  OVER THE COUNTER MEDICATION Pollen  . OVER THE COUNTER MEDICATION Iodine supplement  . OVER THE COUNTER MEDICATION Pumpkin seed extract 262mg  tid  . OVER THE COUNTER MEDICATION BC POWDERS FOR MIGRAINE  . Probiotic Product (PROBIOTIC DAILY PO) Take 1 capsule by mouth 2 (two) times daily.  Marland Kitchen venlafaxine (EFFEXOR) 37.5 MG tablet Take 1  tablet (37.5 mg total) by mouth daily.  . Vinpocetine POWD 30 mg by Does not apply route.   No facility-administered encounter medications on file as of 08/02/2017.     Allergy: No Known Allergies  Social Hx:   Social History   Social History  . Marital status: Married    Spouse name: N/A  . Number of children: N/A  . Years of education: N/A   Occupational History  . Not on file.   Social History Main Topics  . Smoking status: Never Smoker  . Smokeless tobacco: Never Used  . Alcohol use 0.0 oz/week     Comment: socially  . Drug use: No  . Sexual activity: Yes   Other Topics Concern  . Not on file   Social History Narrative  . No narrative on file    Past Surgical Hx:  Past Surgical History:  Procedure Laterality Date  . APPENDECTOMY    . KNEE ARTHROSCOPY Left   . ROBOTIC ASSISTED TOTAL HYSTERECTOMY WITH BILATERAL SALPINGO OOPHERECTOMY N/A 12/03/2013   Procedure: ROBOTIC ASSISTED TOTAL HYSTERECTOMY WITH BILATERAL SALPINGO OOPHORECTOMY WITH BILATERAL LYMPH NODE DISSECTION;  Surgeon: Imagene Gurney A. Alycia Rossetti, MD;  Location: [facility];  Service: Gynecology;  Laterality: N/A;  . TUBAL LIGATION      Past Medical Hx:  Past Medical History:  Diagnosis Date  . Cancer (HCC)    endometrial  . Constipation   . Headache(784.0)    migraines  . Hypothyroidism   . Migraine   . Sleep apnea    CPAP  . Venous insufficiency    bilateral lower legs    Family Hx:  Family History  Problem Relation Age of Onset  . Uterine cancer Mother   . Stroke Mother   . Melanoma Brother   . Heart disease Brother   . Diabetes Father   . Melanoma Father   . Colon cancer Neg Hx   . Breast cancer Neg Hx     Vitals:  Blood pressure (!) 152/84, pulse 76, temperature 98.3 F (36.8 C), temperature source Oral, resp. rate 20, height 5\' 5"  (1.651 m), weight 189 lb (85.7 kg), SpO2 98 %.  Physical Exam: General: Well developed, well nourished female in no acute distress. Alert and oriented x 3.    Neck: Supple, no lymphadenopathy, no thyromegaly.  Lungs: Clear to auscultation bilaterally  Cardiovascular: Regular rate and rhythm  Abdomen: Abdomen soft, non-tender and obese. Active bowel sounds in all quadrants. No evidence of a fluid wave or abdominal masses. Incisions are well-healed. There's a possible small umbilical hernia versus diastasis. Nontender.  Pelvic: External genitalia within normal limits except for a slight red irritated areas on left labia minora.  Speculum examination reveals no visible lesions at the vaginal cuff.  Bimanual examination reveals no masses or nodularity. Rectal confirms.  Groins: No lymphadenopathy.  Extremities: No bilateral cyanosis, edema, or clubbing.  Assessment/Plan:  66 year old with Stage IA endometrioid carcinoma s/p robotic hysterectomy, BSO, and node dissection on 12/03/13.  Final pathology was consistent with a stage IA grade 1 endometrioid adenocarcinoma with negative nodes and she requires no additional treatment.    She'll return  to see her Dr. [place] Challenger in 6 months and will return to see [place] in 1 year.   She is up to date on her colonoscopy and is due in 2023.  She is currently taking Effexor 37.5 mg 1 every other day. She's been doing this since October 4. She will then this week start taking one every third day for 2 weeks and then she can taper off completely. She has no questions regarding this and it was explained.     Helayne Metsker A., MD 08/02/2017, 8:17 AM

## 2017-08-02 NOTE — Patient Instructions (Addendum)
Follow up with Dr. [place] Challenger in 6 months and return to see Gyn Oncology in one year.  Please call [phone] after seeing Dr. [place] Challenger to schedule your appointment.

## 2017-09-13 ENCOUNTER — Ambulatory Visit (HOSPITAL_COMMUNITY)
Admission: RE | Admit: 2017-09-13 | Discharge: 2017-09-13 | Disposition: A | Discharge status: Home / Self Care | Payer: 59 | Source: Ambulatory Visit | Attending: Orthopedic Surgery | Admitting: Orthopedic Surgery

## 2017-09-13 ENCOUNTER — Ambulatory Visit (INDEPENDENT_AMBULATORY_CARE_PROVIDER_SITE_OTHER): Payer: 59

## 2017-09-13 ENCOUNTER — Ambulatory Visit (INDEPENDENT_AMBULATORY_CARE_PROVIDER_SITE_OTHER): Payer: 59 | Admitting: Orthopedic Surgery

## 2017-09-13 ENCOUNTER — Encounter (INDEPENDENT_AMBULATORY_CARE_PROVIDER_SITE_OTHER): Payer: Self-pay

## 2017-09-13 ENCOUNTER — Encounter (INDEPENDENT_AMBULATORY_CARE_PROVIDER_SITE_OTHER): Payer: Self-pay | Admitting: Orthopedic Surgery

## 2017-09-13 VITALS — BP 160/81 | HR 75 | Resp 18 | Ht 65.0 in | Wt 189.0 lb

## 2017-09-13 DIAGNOSIS — M48061 Spinal stenosis, lumbar region without neurogenic claudication: Secondary | ICD-10-CM | POA: Diagnosis not present

## 2017-09-13 DIAGNOSIS — M5441 Lumbago with sciatica, right side: Secondary | ICD-10-CM | POA: Diagnosis not present

## 2017-09-13 DIAGNOSIS — M47816 Spondylosis without myelopathy or radiculopathy, lumbar region: Secondary | ICD-10-CM | POA: Diagnosis not present

## 2017-09-13 DIAGNOSIS — M545 Low back pain: Secondary | ICD-10-CM | POA: Diagnosis not present

## 2017-09-13 MED ORDER — DIAZEPAM 5 MG PO TABS: 5.0000 mg | ORAL_TABLET | Freq: Once | ORAL | 0 refills | Status: AC

## 2017-09-13 MED ORDER — PREDNISONE 5 MG (21) PO TBPK: ORAL_TABLET | ORAL | 0 refills | Status: DC

## 2017-09-13 MED ORDER — HYDROCODONE-ACETAMINOPHEN 5-325 MG PO TABS: 1.0000 | ORAL_TABLET | Freq: Four times a day (QID) | ORAL | 0 refills | Status: DC | PRN

## 2017-09-13 MED ORDER — METHOCARBAMOL 500 MG PO TABS: 500.0000 mg | ORAL_TABLET | Freq: Three times a day (TID) | ORAL | 0 refills | Status: DC | PRN

## 2017-09-13 MED FILL — METHOCARBAMOL 500 MG TABS: 500 | 10 days supply | Qty: 30 | Fill #0

## 2017-09-13 MED FILL — HYDROCODON-APAP 5-325: 5-325 | 4 days supply | Qty: 30 | Fill #0

## 2017-09-13 MED FILL — diazePAM 5 MG TABS: 5 | 1 days supply | Qty: 1 | Fill #0

## 2017-09-13 MED FILL — predniSONE 5 MG TABS: 5 | 6 days supply | Qty: 21 | Fill #0

## 2017-09-13 NOTE — Progress Notes (Signed)
Office Visit Note   Patient: Mandy Holmes           Date of Birth: 11/26/1950           MRN: [id number] Visit Date: 09/13/2017              Requested by: Aldine Contes, MD [address], [place], [place] [address] PCP: Aldine Contes, MD   Assessment & Plan: Visit Diagnoses:  1. Acute right-sided low back pain with right-sided sciatica     Plan:  #1: Recurrent obtain an urgent MRI scan of the lumbar spine which is going to be done tonight.  #2: Follow back up on Friday for review of skin  Follow-Up Instructions: Return in about 2 days (around 09/15/2017).   Orders:  Orders Placed This Encounter  Procedures  . XR HIP UNILAT W OR W/O PELVIS 2-3 VIEWS RIGHT  . XR Lumbar Spine 2-3 Views  . MR Lumbar Spine w/o contrast   Meds ordered this encounter  Medications  . HYDROcodone-acetaminophen (NORCO/VICODIN) 5-325 MG tablet    Sig: Take 1-2 tablets by mouth every 6 (six) hours as needed for moderate pain.    Dispense:  30 tablet    Refill:  0    Order Specific Question:   Supervising Provider    Answer:   Garald Balding [[id number]]  . methocarbamol (ROBAXIN) 500 MG tablet    Sig: Take 1 tablet (500 mg total) by mouth every 8 (eight) hours as needed for muscle spasms.    Dispense:  30 tablet    Refill:  0    Order Specific Question:   Supervising Provider    Answer:   Garald Balding [[id number]]  . predniSONE (STERAPRED UNI-PAK 21 TAB) 5 MG (21) TBPK tablet    Sig: As directed    Dispense:  21 tablet    Refill:  0    Order Specific Question:   Supervising Provider    Answer:   Garald Balding [[id number]]  . diazepam (VALIUM) 5 MG tablet    Sig: Take 1 tablet (5 mg total) by mouth once for 1 dose.    Dispense:  1 tablet    Refill:  0    Order Specific Question:   Supervising Provider    Answer:   Garald Balding [[id number]]      Procedures: No procedures performed   Clinical Data: No additional findings.   Subjective: Chief Complaint    Patient presents with  . Lower Back - Pain  . Back Pain    Severe back pain x 2 weeks, difficulty walking, difficulty sleeping, fallen due to knees x 3 - gentle fall, pain running down right side of leg, IBU doesn't help, difficulty sititng, no surgery to back, not diabetic    HPI  Review of Systems  Constitutional: Positive for activity change and fatigue.  HENT: Negative for facial swelling.   Eyes: Negative for visual disturbance.  Respiratory: Negative for shortness of breath.   Cardiovascular: Positive for leg swelling.  Gastrointestinal: Positive for constipation.  Endocrine: Positive for cold intolerance.  Genitourinary: Negative for difficulty urinating.  Musculoskeletal: Positive for back pain and gait problem.  Skin: Negative for rash.  Allergic/Immunologic: Negative for food allergies.  Neurological: Positive for weakness.  Hematological: Bruises/bleeds easily.  Psychiatric/Behavioral: Positive for sleep disturbance.     Objective: Vital Signs: BP (!) 160/81 (BP Location: Left Arm, Patient Position: Sitting, Cuff Size: Normal)   Pulse 75  Resp 18   Ht 5\' 5"  (1.651 m)   Wt 189 lb (85.7 kg)   BMI 31.45 kg/m   Physical Exam  Constitutional: She is oriented to person, place, and time. She appears well-developed and well-nourished.  HENT:  Head: Normocephalic and atraumatic.  Eyes: EOM are normal. Pupils are equal, round, and reactive to light.  Pulmonary/Chest: Effort normal.  Neurological: She is alert and oriented to person, place, and time.  Skin: Skin is warm and dry.  Psychiatric: She has a normal mood and affect. Her behavior is normal. Judgment and thought content normal.    Ortho Exam  Today she has positive straight leg raise on the right. She does have weakness against resistance in the quad muscles. This is on the right. Otherwise she has good strength. Sensation is decreased along the lateral aspect of her calf. Deep tendon reflexes were not  obtainable in both legs. Calves are supple and nontender. Good pulses bilaterally and dorsalis pedis.  Specialty Comments:  No specialty comments available.  Imaging: Xr Hip Unilat W Or W/o Pelvis 2-3 Views Right  Result Date: 09/13/2017 Two-view x-ray of the pelvis reveals maintenance of joint spaces. Some degenerative changes in the SI joints more left than right. Some narrowing at the symphysis pubis with some sclerosing. otherwise are unremarkable. There is some spurring off of the right ilium.  Xr Lumbar Spine 2-3 Views  Result Date: 09/13/2017 AP pelvis reveals narrowing of the L455 S1 disc space. Lateral spurring L2-3 greater than L4. Narrowing at 5 S1 as well as L2-3. Less narrowing at L34. Anterior spurring noted at L3 and superior L4.    PMFS History: Patient Active Problem List   Diagnosis Date Noted  . Rash and nonspecific skin eruption 12/28/2016  . Dyspareunia, female 12/22/2015  . Preventative health care 05/19/2015  . History of estrogen deficiency 05/19/2015  . Chronic fatigue 05/19/2015  . Hypothyroidism 05/19/2015  . Uterine cancer ([place]) 11/07/2013   Past Medical History:  Diagnosis Date  . Cancer (HCC)    endometrial  . Constipation   . Headache(784.0)    migraines  . Hypothyroidism   . Migraine   . Sleep apnea    CPAP  . Venous insufficiency    bilateral lower legs    Family History  Problem Relation Age of Onset  . Uterine cancer Mother   . Stroke Mother   . Melanoma Brother   . Heart disease Brother   . Diabetes Father   . Melanoma Father   . Colon cancer Neg Hx   . Breast cancer Neg Hx     Past Surgical History:  Procedure Laterality Date  . APPENDECTOMY    . KNEE ARTHROSCOPY Left   . ROBOTIC ASSISTED TOTAL HYSTERECTOMY WITH BILATERAL SALPINGO OOPHERECTOMY N/A 12/03/2013   Procedure: ROBOTIC ASSISTED TOTAL HYSTERECTOMY WITH BILATERAL SALPINGO OOPHORECTOMY WITH BILATERAL LYMPH NODE DISSECTION;  Surgeon: Imagene Gurney A. Alycia Rossetti, MD;  Location: [facility];  Service: Gynecology;  Laterality: N/A;  . TUBAL LIGATION     Social History   Occupational History  . Not on file  Tobacco Use  . Smoking status: Never Smoker  . Smokeless tobacco: Never Used  Substance and Sexual Activity  . Alcohol use: No    Alcohol/week: 0.0 oz    Frequency: Never  . Drug use: No  . Sexual activity: Yes

## 2017-09-15 ENCOUNTER — Ambulatory Visit (INDEPENDENT_AMBULATORY_CARE_PROVIDER_SITE_OTHER): Payer: 59 | Admitting: Orthopaedic Surgery

## 2017-09-15 ENCOUNTER — Ambulatory Visit (INDEPENDENT_AMBULATORY_CARE_PROVIDER_SITE_OTHER): Payer: 59 | Admitting: Internal Medicine

## 2017-09-15 ENCOUNTER — Encounter: Payer: Self-pay | Admitting: Internal Medicine

## 2017-09-15 ENCOUNTER — Encounter (INDEPENDENT_AMBULATORY_CARE_PROVIDER_SITE_OTHER): Payer: Self-pay | Admitting: Orthopaedic Surgery

## 2017-09-15 VITALS — BP 150/77 | HR 82 | Temp 98.0°F | Ht 65.0 in | Wt 189.4 lb

## 2017-09-15 VITALS — BP 168/95 | HR 87 | Resp 16 | Ht 65.0 in | Wt 185.0 lb

## 2017-09-15 DIAGNOSIS — M549 Dorsalgia, unspecified: Secondary | ICD-10-CM | POA: Diagnosis not present

## 2017-09-15 DIAGNOSIS — R030 Elevated blood-pressure reading, without diagnosis of hypertension: Secondary | ICD-10-CM

## 2017-09-15 DIAGNOSIS — R5382 Chronic fatigue, unspecified: Secondary | ICD-10-CM

## 2017-09-15 DIAGNOSIS — M5441 Lumbago with sciatica, right side: Secondary | ICD-10-CM | POA: Diagnosis not present

## 2017-09-15 DIAGNOSIS — E039 Hypothyroidism, unspecified: Secondary | ICD-10-CM | POA: Diagnosis not present

## 2017-09-15 DIAGNOSIS — Z79890 Hormone replacement therapy (postmenopausal): Secondary | ICD-10-CM | POA: Diagnosis not present

## 2017-09-15 NOTE — Assessment & Plan Note (Addendum)
Patient presents with concerns for worsening symptoms of hypothymism. She states that for over a year she feels she has experienced cold intolerance (with some hot flashes as well), hair loss, difficulty sleeping, which has been worsening. She states that in the last 6 months she feels her memory has been worsening as well.   Patient has had normal TSH for the past 2 years. Will obtain repeat thyroid labs. Will evaluate for secondary causes of patent's symptoms (CBC for fatigue). Will check PTH and Ca for Hyperparathyroidism as secondary caouse of patients memory changes as she has had elevated calcium in her last 2 labs. - TSH, Free T3/T4 - CBC - PTH / Ca  ADDENDUM - TSH suppressed at 0.253 - T3 / T4 are normal - PTH and Ca are mildly elevated, see Hypercalcemia note

## 2017-09-15 NOTE — Patient Instructions (Addendum)
Thank you for allowing [place] to care for you  For your hyperthyroidism: - We will continue Synthroid 75mg  for now. - We are obtaining TSH, Free T3, and Free T4 today - We are also obtaining a CBC to evaluate for other causes of fatigue  For your elevated calcium levels: - We are obtaining a PTH (parathyroid hormone) and Calcium level  For your high blood pressure reading - We will recheck at your follow up with your PCP and may consider medication at that time  You will be contacted by phone with abnormal lab results and mailed a letter for normal results.

## 2017-09-15 NOTE — Assessment & Plan Note (Signed)
BP Elevated today at 150/77. BP has been elevated for last several visits as well per chart. She states she is not interested in discussing BP medications at this time and thinks her elevated BP maybe due to Back pain she is experiencing (and been seen by orthopedics for).  - Continue to monitor

## 2017-09-15 NOTE — Assessment & Plan Note (Addendum)
Patient presents with concerns for worsening symptoms of hypothymism. Will check PTH and Ca for Hyperparathyroidism as secondary caouse of patients memory changes as she has had elevated calcium in her last 2 labs. - PTH / Ca ADDENDUM - PTH mildly elevated at 69 and Calcium mildly elevated at 10.4.   - Ca is elevated in the setting of Ca supplementation, which the patient will be instructed to discontinue - Recommend repeat PTH and Ca with Albumin for corrected Ca value at follow up to confirm  - Consider Repeat DEXA in the setting of elevated PTH and Synthroid therapy. Last DEXA (08/20/15) was Normal with BMD at Right Femur Neck of 0.954 g/cm2 and T-score of -0.6.

## 2017-09-15 NOTE — Assessment & Plan Note (Addendum)
Patient continues to complain of fatigue and is attributing this to what she thinks is worsening of her hypothyroid clinic. See note on hypo thyroidism for details. - TSH, T3, T4 - CBC - PTH / Ca ADDENDUM - CBC WNL - See Hypothyroidism note on TSH, T3, T4 - See Hypercalcemia note on PTH, Ca

## 2017-09-15 NOTE — Progress Notes (Signed)
Office Visit Note   Patient: Mandy Holmes           Date of Birth: 1951-02-01           MRN: [id number] Visit Date: 09/15/2017              Requested by: Aldine Contes, MD [address], [place], [place] [address] PCP: Aldine Contes, MD   Assessment & Plan: Visit Diagnoses:  1. Acute right-sided low back pain with right-sided sciatica     Plan: MRI scan performed demonstrates mildly progressive degenerative changes in the lower lumbar spine. There is no acute change on the right side that creates her right leg pain. There is some foraminal stenosis on the left but she is asymptomatic on the left. Long discussion regarding MRI scan findings. No evidence of weakness. Continue with prednisone tapering, Robaxin and hydrocodone. We'll check in a week if no change  Follow-Up Instructions: Return if symptoms worsen or fail to improve.   Orders:  No orders of the defined types were placed in this encounter.  No orders of the defined types were placed in this encounter.     Procedures: No procedures performed   Clinical Data: No additional findings.   Subjective: Chief Complaint  Patient presents with  . Lower Back - Pain, Results    Mandy Holmes is a 66 y o here for MRI results from low back into her leg and left foot  Seen by Aaron Edelman 2 days ago for evaluation of low back surgery with right lower extremity pain. Findings concerned that she might of cauda equina syndrome or possibly an acute disc rupture and ordered an MRI scan. MRI scan was performed without any acute changes on the right side. There are some mildly progressive degenerative changes in the lumbar spine and some foraminal stenosis surgically on the left. Her pain might be referred discomfort  from the degenerative change. Definitely feeling better. No bowel or bladder dysfunction. HPI  Review of Systems   Objective: Vital Signs: BP (!) 168/95   Pulse 87   Resp 16   Ht 5\' 5"  (1.651 m)    Wt 185 lb (83.9 kg)   BMI 30.79 kg/m   Physical Exam  Ortho Exam awake alert and oriented 3. Comfortable sitting. Straight leg raise negative. Reflexes were symmetrical. No weakness. His range of motion both hips and both knees. No significant percussible tenderness of the lumbar spine  Specialty Comments:  No specialty comments available.  Imaging: No results found.   PMFS History: Patient Active Problem List   Diagnosis Date Noted  . Rash and nonspecific skin eruption 12/28/2016  . Dyspareunia, female 12/22/2015  . Preventative health care 05/19/2015  . History of estrogen deficiency 05/19/2015  . Chronic fatigue 05/19/2015  . Hypothyroidism 05/19/2015  . Uterine cancer ([place]) 11/07/2013   Past Medical History:  Diagnosis Date  . Cancer (HCC)    endometrial  . Constipation   . Headache(784.0)    migraines  . Hypothyroidism   . Migraine   . Sleep apnea    CPAP  . Venous insufficiency    bilateral lower legs    Family History  Problem Relation Age of Onset  . Uterine cancer Mother   . Stroke Mother   . Melanoma Brother   . Heart disease Brother   . Diabetes Father   . Melanoma Father   . Colon cancer Neg Hx   . Breast cancer Neg Hx  Past Surgical History:  Procedure Laterality Date  . APPENDECTOMY    . KNEE ARTHROSCOPY Left   . ROBOTIC ASSISTED TOTAL HYSTERECTOMY WITH BILATERAL SALPINGO OOPHERECTOMY N/A 12/03/2013   Procedure: ROBOTIC ASSISTED TOTAL HYSTERECTOMY WITH BILATERAL SALPINGO OOPHORECTOMY WITH BILATERAL LYMPH NODE DISSECTION;  Surgeon: Imagene Gurney A. Alycia Rossetti, MD;  Location: [facility];  Service: Gynecology;  Laterality: N/A;  . TUBAL LIGATION     Social History   Occupational History  . Not on file  Tobacco Use  . Smoking status: Never Smoker  . Smokeless tobacco: Never Used  Substance and Sexual Activity  . Alcohol use: No    Alcohol/week: 0.0 oz    Frequency: Never  . Drug use: No  . Sexual activity: Yes     Garald Balding,  MD   Note - This record has been created using [employer].  Chart creation errors have been sought, but may not always  have been located. Such creation errors do not reflect on  the standard of medical care.

## 2017-09-15 NOTE — Progress Notes (Signed)
Internal Medicine Clinic Attending  I saw and evaluated the patient.  I personally confirmed the key portions of the history and exam documented by Dr. Trilby Drummer and I reviewed pertinent patient test results.  The assessment, diagnosis, and plan were formulated together and I agree with the documentation in the resident's note. T3/T4 not needed to ensure proper dosing of synthroid but ordering for pt comfort / reassurance.

## 2017-09-15 NOTE — Progress Notes (Signed)
   CC: Hypothyroidism, Chronic Fatigue, Hypercalcemia  HPI:  Mandy Holmes is a 66 y.o. F with PMHx listed below presenting for Hypothyroidism, Chronic Fatigue, Hypercalcemia. Please see the A&P for the status of the patient's chronic medical problems.  Patient presents with concerns for worsening symptoms of hypothymism. She states that for over a year she feels she has experienced cold intolerance (with some hot flashes as well), hair loss, difficulty sleeping, which has been worsening. She states that in the last several months she feels her memory has been worsening as well.   Of note, patient is undergoing significant family stress due to her daughters divorce and harrassment by their son-in-law for about the last 5 months per patient.    Past Medical History:  Diagnosis Date  . Cancer (HCC)    endometrial  . Constipation   . Headache(784.0)    migraines  . Hypothyroidism   . Migraine   . Sleep apnea    CPAP  . Venous insufficiency    bilateral lower legs   Review of Systems:  Performed and negative except as otherwise indicated.  Physical Exam:  Vitals:   09/15/17 0936  BP: (!) 150/77  Pulse: 82  Temp: 98 F (36.7 C)  TempSrc: Oral  SpO2: 98%  Weight: 189 lb 6.4 oz (85.9 kg)  Height: 5\' 5"  (1.651 m)   Physical Exam  Constitutional: She is oriented to person, place, and time. She appears well-developed and well-nourished.  HENT:  Head: Normocephalic and atraumatic.  Thinning hair at the top of her head  Eyes: EOM are normal. Right eye exhibits no discharge. Left eye exhibits no discharge.  Cardiovascular: Normal rate.  Pulmonary/Chest: Effort normal. No respiratory distress.  Musculoskeletal: She exhibits no edema or deformity.  Neurological: She is alert and oriented to person, place, and time.     Assessment & Plan:   See Encounters Tab for problem based charting.  Patient seen with Dr. Lynnae January

## 2017-09-16 LAB — CBC
HEMATOCRIT: 41.6 % (ref 34.0–46.6)
Hemoglobin: 13.9 g/dL (ref 11.1–15.9)
MCH: 30.3 pg (ref 26.6–33.0)
MCHC: 33.4 g/dL (ref 31.5–35.7)
MCV: 91 fL (ref 79–97)
PLATELETS: 354 10*3/uL (ref 150–379)
RBC: 4.58 x10E6/uL (ref 3.77–5.28)
RDW: 13 % (ref 12.3–15.4)
WBC: 9.4 10*3/uL (ref 3.4–10.8)

## 2017-09-16 LAB — T3, FREE: T3, Free: 2.2 pg/mL (ref 2.0–4.4)

## 2017-09-16 LAB — PTH, INTACT AND CALCIUM
Calcium: 10.4 mg/dL — ABNORMAL HIGH (ref 8.7–10.3)
PTH: 69 pg/mL — ABNORMAL HIGH (ref 15–65)

## 2017-09-16 LAB — TSH: TSH: 0.253 u[IU]/mL — ABNORMAL LOW (ref 0.450–4.500)

## 2017-09-16 LAB — T4, FREE: Free T4: 1.61 ng/dL (ref 0.82–1.77)

## 2017-09-20 ENCOUNTER — Telehealth: Payer: Self-pay | Admitting: Internal Medicine

## 2017-09-20 NOTE — Telephone Encounter (Signed)
Patient was unable to be reached at her cell phone or home phone. Husband was contacted at his cell phone as the chart has a permanent note indicating it is okay to contact her husband. Patient called back and was given information as well.  They were informed of recent lab results for CBC, TSH, T3, T4, PTH, Ca. They were informed that PTH and Ca were mildly elevated and that we would like her to stop calcium supplementation. They were informed that she would likely receive repeat testing at her follow up after discontinuing her supplements in order to confirm the elevation as it is only mildly elevated.

## 2017-09-25 MED FILL — LEVOTHYROXINE 75 MCG TABLET: 75 | 90 days supply | Qty: 90 | Fill #3

## 2017-10-05 MED FILL — ESTRADIOL 0.1 MG/GM CREA: 0.1 | 90 days supply | Qty: 43 | Fill #2

## 2017-10-18 ENCOUNTER — Other Ambulatory Visit (INDEPENDENT_AMBULATORY_CARE_PROVIDER_SITE_OTHER): Payer: Self-pay | Admitting: Orthopedic Surgery

## 2017-10-25 ENCOUNTER — Telehealth (INDEPENDENT_AMBULATORY_CARE_PROVIDER_SITE_OTHER): Payer: Self-pay | Admitting: Orthopedic Surgery

## 2017-10-25 MED ORDER — METHOCARBAMOL 500 MG PO TABS: 500.0000 mg | ORAL_TABLET | Freq: Three times a day (TID) | ORAL | 0 refills | Status: DC | PRN

## 2017-10-25 NOTE — Telephone Encounter (Signed)
Ok to refill 

## 2017-10-25 NOTE — Telephone Encounter (Signed)
Refill entered into system. I attempted to reach patient and advise but no answer and her voicemail system is not set up.

## 2017-10-25 NOTE — Telephone Encounter (Signed)
Ok for refill? 

## 2017-10-25 NOTE — Telephone Encounter (Signed)
Patient called to check on her prescription refill of Methocarbanol 500 mg.  Patient uses [facility].  Patient's CB# [phone]

## 2017-10-30 ENCOUNTER — Other Ambulatory Visit (INDEPENDENT_AMBULATORY_CARE_PROVIDER_SITE_OTHER): Payer: Self-pay

## 2017-10-30 MED ORDER — METHOCARBAMOL 500 MG PO TABS: 500.0000 mg | ORAL_TABLET | Freq: Three times a day (TID) | ORAL | 0 refills | Status: DC | PRN

## 2017-10-30 MED FILL — METHOCARBAMOL 500 MG TABS: 500 | 10 days supply | Qty: 30 | Fill #0

## 2017-12-05 DIAGNOSIS — G4733 Obstructive sleep apnea (adult) (pediatric): Secondary | ICD-10-CM | POA: Diagnosis not present

## 2017-12-05 DIAGNOSIS — H5213 Myopia, bilateral: Secondary | ICD-10-CM | POA: Diagnosis not present

## 2017-12-12 ENCOUNTER — Other Ambulatory Visit: Payer: Self-pay | Admitting: Internal Medicine

## 2017-12-12 NOTE — Telephone Encounter (Signed)
levothyroxine (SYNTHROID, LEVOTHROID) 75 MCG tablet, Refill request @ outpatient pharmacy.

## 2017-12-18 MED FILL — LEVOTHYROXINE 75 MCG TABLET: 75 | 90 days supply | Qty: 90 | Fill #0

## 2018-01-15 MED FILL — ESTRADIOL 0.1 MG/GM CRM: 0.1 | 90 days supply | Qty: 43 | Fill #3

## 2018-01-29 ENCOUNTER — Ambulatory Visit (INDEPENDENT_AMBULATORY_CARE_PROVIDER_SITE_OTHER): Payer: 59 | Admitting: Internal Medicine

## 2018-01-29 VITALS — BP 149/75 | HR 70 | Temp 97.9°F | Ht 65.0 in | Wt 188.8 lb

## 2018-01-29 DIAGNOSIS — Z0000 Encounter for general adult medical examination without abnormal findings: Secondary | ICD-10-CM | POA: Diagnosis not present

## 2018-01-29 DIAGNOSIS — I872 Venous insufficiency (chronic) (peripheral): Secondary | ICD-10-CM | POA: Diagnosis not present

## 2018-01-29 DIAGNOSIS — E669 Obesity, unspecified: Secondary | ICD-10-CM | POA: Diagnosis not present

## 2018-01-29 DIAGNOSIS — E6609 Other obesity due to excess calories: Secondary | ICD-10-CM | POA: Insufficient documentation

## 2018-01-29 DIAGNOSIS — Z79890 Hormone replacement therapy (postmenopausal): Secondary | ICD-10-CM | POA: Diagnosis not present

## 2018-01-29 DIAGNOSIS — Z6831 Body mass index (BMI) 31.0-31.9, adult: Secondary | ICD-10-CM | POA: Diagnosis not present

## 2018-01-29 DIAGNOSIS — R030 Elevated blood-pressure reading, without diagnosis of hypertension: Secondary | ICD-10-CM | POA: Diagnosis not present

## 2018-01-29 NOTE — Progress Notes (Signed)
Case discussed with Dr. Huang at the time of the visit.  We reviewed the resident's history and exam and pertinent patient test results.  I agree with the assessment, diagnosis and plan of care documented in the resident's note. 

## 2018-01-29 NOTE — Assessment & Plan Note (Signed)
-   Hx of TAH/BSO for grade 1 endometrial carcinoma. No need for PAP smear for cervical cancer screening - Will obtain HCV screening at next visit - A1c 5.9 in 2016, patient asymptomatic. Patient is overweight and has elevated BP. Consider repeat A1c at next visit

## 2018-01-29 NOTE — Patient Instructions (Addendum)
Annual Wellness Visit   Medicare Covered Preventative Screenings and Services  Services & Screenings Men and Women Who How Often Need? Date of Last Service Action  Abdominal Aortic Aneurysm Adults with AAA risk factors Once No    Alcohol Misuse and Counseling All Adults Screening once a year if no alcohol misuse. Counseling up to 4 face to face sessions. No    Bone Density Measurement  Adults at risk for osteoporosis Once every 2 yrs No 2016   Lipid Panel Z13.6 All adults without CV disease Once every 5 yrs No 03/2017   Colorectal Cancer   Stool sample or  Colonoscopy All adults 82 and older   Once every year  Every 10 years No 2017   Depression All Adults Once a year Yes Today Continue to monitor  Diabetes Screening Blood glucose, post glucose load, or GTT Z13.1  All adults at risk  Pre-diabetics  Once per year  Twice per year No 2016 Will complete at next visit  Diabetes  Self-Management Training All adults Diabetics 10 hrs first year; 2 hours subsequent years. Requires Copay No    Glaucoma  Diabetics  Family history of glaucoma  African Americans 24 yrs +  Hispanic Americans 43 yrs + Annually - requires coppay No    Hepatitis C Z72.89 or F19.20  High Risk for HCV  Born between 1945 and 1965  Annually  Once No  Will complete at next visit  HIV Z11.4 All adults based on risk  Annually btw ages 20 & 64 regardless of risk  Annually > 65 yrs if at increased risk No    Lung Cancer Screening Asymptomatic adults aged 59-77 with 69 pack yr history and current smoker OR quit within the last 15 yrs Annually Must have counseling and shared decision making documentation before first screen No    Medical Nutrition Therapy Adults with   Diabetes  Renal disease  Kidney transplant within past 3 yrs 3 hours first year; 2 hours subsequent years No    Obesity and Counseling All adults Screening once a year Counseling if BMI 30 or higher Yes Today Completed nutrition and  exercise counseling today  Tobacco Use Counseling Adults who use tobacco  Up to 8 visits in one year No    Vaccines Z23  Hepatitis B  Influenza   Pneumonia  Adults   Once  Once every flu season  Two different vaccines separated by one year No    Next Annual Wellness Visit People with Medicare Every year Yes Today 01/2019    Services & Screenings Women Who How Often Need  Date of Last Service Action  Mammogram  Z12.31 Women over 8 One baseline ages 12-39. Annually ager 20 yrs+ No 07/2017   Pap tests All women Annually if high risk. Every 2 yrs for normal risk women No    Screening for cervical cancer with   Pap (Z01.419 nl or Z01.411abnl) &  HPV Z11.51 Women aged 45 to 9 Once every 5 yrs No, hx of TAH/BSO for endometrial cancer    Screening pelvic and breast exams All women Annually if high risk. Every 2 yrs for normal risk women No    Sexually Transmitted Diseases  Chlamydia  Gonorrhea  Syphilis All at risk adults Annually for non pregnant females at increased risk No       Things That May Be Affecting Your Health:  Alcohol  Hearing loss  Pain    Depression  Home Safety  Sexual Health  Diabetes X Lack of physical activity X Stress   Difficulty with daily activities  Loneliness  Tiredness   Drug use  Medicines  Tobacco use   Falls  Motor Vehicle Safety X Weight   Food choices  Oral Health  Other    YOUR PERSONALIZED HEALTH PLAN : 1. Schedule your next subsequent Medicare Wellness visit in one year 2. Attend all of your regular appointments to address your medical issues 3. Complete the preventative screenings and services 4. Work on low salt diet to improve your blood pressure and to help lose weight. 5. Work on at least 30 minutes of exercise 5 days a week to help lose weight.

## 2018-01-29 NOTE — Assessment & Plan Note (Signed)
Assessment BP elevated at 149/75, not currently on anti-hypertensive therapy. She is not interested in starting a blood pressure medication - she thinks her BP is elevated due to the long walk to the clinic, her current stressors, and her obesity.  Plan - Counseled on lifestyle modifications, including nutrition and exercise - Continue to monitor

## 2018-01-29 NOTE — Assessment & Plan Note (Signed)
Assessment PTH slightly elevated with slightly elevated Ca, patient denies symptoms of hypercalcemia, consistent with asymptomatic hyperparathyroidism. Patient has discontinued calcium supplement. DEXA scan in 2016 with T score of -0.6. For asymptomatic hyperparathyroidism, will continue to follow.  Plan - Continue to follow

## 2018-01-29 NOTE — Progress Notes (Signed)
   Subjective:   Mandy Holmes is a 67 y.o. female who presents for a Medicare Annual Wellness Visit.  The following items have been reviewed and updated today in the appropriate area in the EMR.   Health Risk Assessment  Height, weight, BMI, and BP Visual acuity if needed Depression screen Fall risk / safety level Advance directive discussion Medical and family history were reviewed and updated Updating list of other providers & suppliers Medication reconciliation, including over the counter medicines Cognitive screen Written screening schedule Risk Factor list Personalized health advice, risky behaviors, and treatment advice   Current Social History 01/29/2018    Patient lives with family in a/an home / condo / townhome which is 1 story/stories. There are steps up to the entrance the patient uses.   Patient's method of transportation is personal car.  The highest level of education was some college and medical secretarial degree (completed 2 years at [employer])  The patient currently is employed as patient account specialist.  Identified important Relationships are family, best friend  Pets : 3 dogs in the home   Interests / Fun: read, travel, outdoors  Current Stressors: family (3 grandchildren), one of her daughters is going through a difficult divorce  Religious / Personal Beliefs: Christian         Objective:    Vitals: BP (!) 149/75 (BP Location: Right Arm, Patient Position: Sitting, Cuff Size: Normal)   Pulse 70   Temp 97.9 F (36.6 C) (Oral)   Ht 5\' 5"  (1.651 m)   Wt 188 lb 12.8 oz (85.6 kg)   SpO2 98%   BMI 31.42 kg/m   Activities of Daily Living No flowsheet data found.  Goals - BP <140/90 - BMI <25  Fall Risk Fall Risk  01/29/2018 09/15/2017 04/04/2017 12/28/2016 12/22/2015  Falls in the past year? No Yes No No No  Number falls in past yr: - 2 or more - - -  Injury with Fall? - No - - -  Risk Factor Category  - High Fall Risk - - -  Risk for  fall due to : - History of fall(s);Other (Comment) - - -  Risk for fall due to: Comment - fals from leaning from chair  - - -  Follow up - Falls prevention discussed - - -    Depression Screen PHQ 2/9 Scores 01/29/2018 09/15/2017 04/04/2017 12/28/2016  PHQ - 2 Score 0 6 0 0  PHQ- 9 Score - 10 - -     Cognitive Testing I assessed the patient for cognitive issues and the patient did not have issues with his / her cognition. Mini-cog score of 3/4 (missed one word)  Physical Exam GEN: Sitting in chair in NAD HENT: MMM, no visible lesions EYES: PERRL, anicteric sclera CV: NR & RR, no m/r/g PULM: CTAB, no wheezes or rales ABD: Soft, NT, ND, +BS, obese MSK: +BLE non-pitting edema (consistent with venous insufficiency) SKIN: No rashes NEURO: CN II-XII grossly intact, moving all extremities spontaneously PSYCH: Calm, pleasant  Assessment and Plan:    During the course of the visit the patient was educated and counseled about appropriate screening and preventive services as documented in the assessment and plan.  The printed AVS was given to the patient and included an updated screening schedule, a list of risk factors, and personalized health advice.       Colbert Ewing, MD  01/29/2018   Patient discussed with Dr. Eppie Gibson

## 2018-01-29 NOTE — Assessment & Plan Note (Signed)
Assessment BMI 31. She reports she has gained 10lbs recently. She states she does not eat much and does walk a lot while at work. TSH was low at last visit, currently on levothyroxine 89mcg daily. No need to make changes on this.  Plan - Counseled on lifestyle modifications, including nutrition and exercise

## 2018-02-14 ENCOUNTER — Other Ambulatory Visit: Payer: Self-pay | Admitting: Internal Medicine

## 2018-02-27 ENCOUNTER — Other Ambulatory Visit (INDEPENDENT_AMBULATORY_CARE_PROVIDER_SITE_OTHER): Payer: Self-pay | Admitting: Radiology

## 2018-02-27 DIAGNOSIS — M545 Low back pain: Principal | ICD-10-CM

## 2018-02-27 DIAGNOSIS — G8929 Other chronic pain: Secondary | ICD-10-CM

## 2018-03-26 ENCOUNTER — Ambulatory Visit (INDEPENDENT_AMBULATORY_CARE_PROVIDER_SITE_OTHER): Payer: 59

## 2018-03-26 ENCOUNTER — Ambulatory Visit (INDEPENDENT_AMBULATORY_CARE_PROVIDER_SITE_OTHER): Payer: 59 | Admitting: Physical Medicine and Rehabilitation

## 2018-03-26 ENCOUNTER — Encounter (INDEPENDENT_AMBULATORY_CARE_PROVIDER_SITE_OTHER): Payer: Self-pay | Admitting: Physical Medicine and Rehabilitation

## 2018-03-26 VITALS — BP 141/92 | HR 87

## 2018-03-26 DIAGNOSIS — M5441 Lumbago with sciatica, right side: Secondary | ICD-10-CM | POA: Diagnosis not present

## 2018-03-26 DIAGNOSIS — G8929 Other chronic pain: Secondary | ICD-10-CM | POA: Diagnosis not present

## 2018-03-26 DIAGNOSIS — M25562 Pain in left knee: Secondary | ICD-10-CM | POA: Diagnosis not present

## 2018-03-26 DIAGNOSIS — M25561 Pain in right knee: Secondary | ICD-10-CM

## 2018-03-26 DIAGNOSIS — M5416 Radiculopathy, lumbar region: Secondary | ICD-10-CM

## 2018-03-26 DIAGNOSIS — M25551 Pain in right hip: Secondary | ICD-10-CM | POA: Diagnosis not present

## 2018-03-26 MED ORDER — METHYLPREDNISOLONE ACETATE 80 MG/ML IJ SUSP
80.0000 mg | Freq: Once | INTRAMUSCULAR | Status: AC
  Administered 2018-03-26: 80 mg

## 2018-03-26 MED FILL — LEVOTHYROXINE 75 MCG TABLET: 75 | 90 days supply | Qty: 90 | Fill #1

## 2018-03-26 NOTE — Patient Instructions (Signed)

## 2018-03-26 NOTE — Procedures (Signed)
Lumbar Epidural Steroid Injection - Interlaminar Approach with Fluoroscopic Guidance  Patient: Mandy Holmes      Date of Birth: [birth year] MRN: [id number] PCP: Aldine Contes, MD      Visit Date: 03/26/2018   Universal Protocol:     Consent Given By: the patient  Position: PRONE  Additional Comments: Vital signs were monitored before and after the procedure. Patient was prepped and draped in the usual sterile fashion. The correct patient, procedure, and site was verified.   Injection Procedure Details:  Procedure Site One Meds Administered:  Meds ordered this encounter  Medications  . methylPREDNISolone acetate (DEPO-MEDROL) injection 80 mg     Laterality: Right  Location/Site:  L5-S1  Needle size: 20 G  Needle type: Tuohy  Needle Placement: Paramedian epidural  Findings:   -Comments: Excellent flow of contrast into the epidural space.  Procedure Details: Using a paramedian approach from the side mentioned above, the region overlying the inferior lamina was localized under fluoroscopic visualization and the soft tissues overlying this structure were infiltrated with 4 ml. of 1% Lidocaine without Epinephrine. The Tuohy needle was inserted into the epidural space using a paramedian approach.   The epidural space was localized using loss of resistance along with lateral and bi-planar fluoroscopic views.  After negative aspirate for air, blood, and CSF, a 2 ml. volume of Isovue-250 was injected into the epidural space and the flow of contrast was observed. Radiographs were obtained for documentation purposes.    The injectate was administered into the level noted above.   Additional Comments:  The patient tolerated the procedure well Dressing: Band-Aid    Post-procedure details: Patient was observed during the procedure. Post-procedure instructions were reviewed.  Patient left the clinic in stable condition.

## 2018-03-26 NOTE — Progress Notes (Signed)
Mandy Holmes - 67 y.o. female MRN [id number]  Date of birth: Dec 01, 1950  Office Visit Note: Visit Date: 03/26/2018 PCP: Aldine Contes, MD Referred by: Aldine Contes, MD  Subjective: Chief Complaint  Patient presents with  . Right Leg - Pain  . Lower Back - Pain   HPI: Mandy Holmes is a 67 year old female who comes in today accompanied by her husband who provides some of the history.  She sees Dr. Durward Fortes and Biagio Borg, PA-C for orthopedic care and mainly treatment for her bilateral knee arthritis.  Evidently in the latter part of November she had acute onset of low back pain and right radicular leg pain down the leg into the top of the foot.  She was having some weakness as well and she had a couple falls during her knees.  When she was evaluated in December they were concerned enough to get an MRI of the lumbar spine which is reviewed today.  This is reviewed the notes below.  She has not had any prior spine surgery or injections.  She states that with giving birth she had some a saddle anesthetic performed.  She describes right radicular pain more of a classic L5 distribution to the anterior lateral part of the lower leg and the top of the foot.  She denies any left-sided complaints.  At the time that it started it was severe enough that ibuprofen was not helping at all.  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  She rates her pain on average as a 10 out of 10 even now.  She does take ibuprofen seems to take the edge off a little.  She reports worsening with standing for long periods or sitting on something hard.  She states that it does affect her activities of daily living.  She does not report any specific paresthesias she does feel weakness at times but no foot drop.  She reports difficulty at work to a degree she has to reach over and forward flex to put  files in the filing cabinet.  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  And has to do some filing.  She is on her feet all day moving back and forth she has not noted any bowel or bladder changes.  She continues to have pain in the knees bilaterally which seem to be helped at times with intermittent injection by Dr. Durward Fortes and Aaron Edelman.  She has not had reevaluation since January after the MRI was complete.   Review of Systems  Constitutional: Negative for chills, fever, malaise/fatigue and weight loss.  HENT: Negative for hearing loss and sinus pain.   Eyes: Negative for blurred vision, double vision and photophobia.  Respiratory: Negative for cough and shortness of breath.   Cardiovascular: Negative for chest pain, palpitations and leg swelling.  Gastrointestinal: Negative for abdominal pain, nausea and vomiting.  Genitourinary: Negative for flank pain.  Musculoskeletal: Positive for back pain, falls and joint pain. Negative for myalgias.  Skin: Negative for itching and rash.  Neurological: Negative for tremors, focal weakness and weakness.  Endo/Heme/Allergies: Negative.   Psychiatric/Behavioral: Negative for depression.  All other systems reviewed and are negative.  Otherwise per HPI.  Assessment & Plan: Visit Diagnoses:  1. Lumbar radiculopathy   2. Chronic bilateral low back pain with right-sided sciatica   3. Pain in right  hip   4. Chronic pain of both knees     Plan: Findings:  Chronic worsening low back pain now that has been ongoing since latter part of November early part of December 2018.  There is really abrupt acute onset.  Her symptoms are still rated by her is 10 out of 10 pain although she looks pretty comfortable in the chair.  Her exam is really nonfocal she has good strength in both lower extremities with some mild swelling.  She has no pain with hip rotation.  She does ambulate without aid with a fairly normal gait.  Given the MRI  findings of mainly facet arthritis but with some lateral recess narrowing and do think she is getting a radiculitis radiculopathy of the L5 nerve root on the right.  There is no overt nerve compression on the MRI.  I think the first step is a right L5-S1 interlaminar epidural steroid injection.  We have gone over that at length today and she does want to go ahead and do that to see if we get her some help.  Depending on relief would look at facet joint injection.  She could be having a facet joint syndrome which is a referral pattern more usually related to the nerve itself but just from joint referral pattern and this is most classically in an L5 distribution.  She will continue with current medications.    Meds & Orders:  Meds ordered this encounter  Medications  . methylPREDNISolone acetate (DEPO-MEDROL) injection 80 mg    Orders Placed This Encounter  Procedures  . XR C-ARM NO REPORT  . Epidural Steroid injection    Follow-up: Return if symptoms worsen or fail to improve, for Dr. Durward Fortes.   Procedures: No procedures performed  Lumbar Epidural Steroid Injection - Interlaminar Approach with Fluoroscopic Guidance  Patient: Mandy Holmes      Date of Birth: [birth year] MRN: [id number] PCP: Aldine Contes, MD      Visit Date: 03/26/2018   Universal Protocol:     Consent Given By: the patient  Position: PRONE  Additional Comments: Vital signs were monitored before and after the procedure. Patient was prepped and draped in the usual sterile fashion. The correct patient, procedure, and site was verified.   Injection Procedure Details:  Procedure Site One Meds Administered:  Meds ordered this encounter  Medications  . methylPREDNISolone acetate (DEPO-MEDROL) injection 80 mg     Laterality: Right  Location/Site:  L5-S1  Needle size: 20 G  Needle type: Tuohy  Needle Placement: Paramedian epidural  Findings:   -Comments: Excellent flow of contrast into the  epidural space.  Procedure Details: Using a paramedian approach from the side mentioned above, the region overlying the inferior lamina was localized under fluoroscopic visualization and the soft tissues overlying this structure were infiltrated with 4 ml. of 1% Lidocaine without Epinephrine. The Tuohy needle was inserted into the epidural space using a paramedian approach.   The epidural space was localized using loss of resistance along with lateral and bi-planar fluoroscopic views.  After negative aspirate for air, blood, and CSF, a 2 ml. volume of Isovue-250 was injected into the epidural space and the flow of contrast was observed. Radiographs were obtained for documentation purposes.    The injectate was administered into the level noted above.   Additional Comments:  The patient tolerated the procedure well Dressing: Band-Aid    Post-procedure details: Patient was observed during the procedure. Post-procedure instructions were reviewed.  Patient left the clinic  in stable condition.   Clinical History: MRI LUMBAR SPINE WITHOUT CONTRAST  TECHNIQUE: Multiplanar, multisequence MR imaging of the lumbar spine was performed. No intravenous contrast was administered.  COMPARISON:  Lumbar spine radiographs September 13, 2017 at 1409 hours and MRI lumbar spine June 16, 2015  FINDINGS: SEGMENTATION: For the purposes of this report, the last well-formed intervertebral disc is reported as L5-S1.  ALIGNMENT: Maintained lumbar lordosis. No malalignment.  VERTEBRAE:Vertebral bodies are intact. Moderate to severe L2-3 and L3-4 disc height loss, mildly progressed from prior MRI. Similar mild L4-5 and L5-S1 disc height loss. Mild disc desiccation all lumbar levels. Multilevel mild chronic discogenic endplate changes without acute or abnormal bone marrow signal. Scattered old Schmorl's nodes.  CONUS MEDULLARIS AND CAUDA EQUINA: Conus medullaris terminates at T12-L1 and  demonstrates normal morphology and signal characteristics. Cauda equina is normal.  PARASPINAL AND OTHER SOFT TISSUES: Included prevertebral and paraspinal soft tissues are normal.  DISC LEVELS:  L1-2: No disc bulge, canal stenosis nor neural foraminal narrowing.  L2-3: Similar 3 mm broad-based disc bulge. No canal stenosis. Mild bilateral caudal neural foraminal narrowing.  L3-4: Similar to slightly decreased 2 mm broad-based disc bulge asymmetric to the LEFT could affect the exited LEFT L3 nerve, slightly worse. Mild facet arthropathy and ligamentum flavum redundancy without canal stenosis. Mild to moderate bilateral neural foraminal narrowing.  L4-5: Similar 2 mm broad-based disc bulge. Similar moderate to severe facet arthropathy and ligamentum flavum redundancy. Minimal canal stenosis in trefoil configuration. Mild RIGHT neural foraminal narrowing.  L5-S1: Similar 2 mm broad-based disc bulge. Moderate to severe facet arthropathy without canal stenosis. Mild RIGHT neural foraminal narrowing.  IMPRESSION: 1. Mildly progressed degenerative change of the lumbar spine without acute osseous process. 2. Minimal canal stenosis L4-5. 3. Neural foraminal narrowing L3-4 through L5-S1: Mild to moderate at L3-4. Possible exited LEFT L3 nerve impingement. 4. These results will be called to the ordering clinician or representative by the Radiology Department at the imaging location.   Electronically Signed   By: Elon Alas M.D.   On: 09/13/2017 20:10   She reports that she has never smoked. She has never used smokeless tobacco. No results for input(s): HGBA1C, LABURIC in the last 8760 hours.  Objective:  VS:  HT:    WT:   BMI:     BP:(!) 141/92  HR:87bpm  TEMP: ( )  RESP:  Physical Exam  Constitutional: She is oriented to person, place, and time. She appears well-developed and well-nourished.  Eyes: Pupils are equal, round, and reactive to light.  Conjunctivae and EOM are normal.  Cardiovascular: Normal rate and intact distal pulses.  Pulmonary/Chest: Effort normal.  Musculoskeletal:  Patient ambulates without aid with a fairly normal gait.  She is slow to rise from seated position and has pain with extension of the lumbar spine.  She has good distal strength.  Neurological: She is alert and oriented to person, place, and time. She exhibits normal muscle tone. Coordination normal.  Skin: Skin is warm and dry. No rash noted. No erythema.  Psychiatric: She has a normal mood and affect. Her behavior is normal.  Nursing note and vitals reviewed.   Ortho Exam Imaging: No results found.  Past Medical/Family/Surgical/Social History: Medications & Allergies reviewed per EMR, new medications updated. Patient Active Problem List   Diagnosis Date Noted  . Obesity 01/29/2018  . Hypercalcemia 09/15/2017  . Elevated blood pressure reading 09/15/2017  . Dyspareunia, female 12/22/2015  . Preventative health care 05/19/2015  . History  of estrogen deficiency 05/19/2015  . Chronic fatigue 05/19/2015  . Hypothyroidism 05/19/2015  . Uterine cancer ([place]) 11/07/2013   Past Medical History:  Diagnosis Date  . Cancer (HCC)    endometrial  . Constipation   . Headache(784.0)    migraines  . Hypothyroidism   . Migraine   . Sleep apnea    CPAP  . Venous insufficiency    bilateral lower legs   Family History  Problem Relation Age of Onset  . Uterine cancer Mother   . Stroke Mother   . Melanoma Brother   . Heart disease Brother   . Diabetes Father   . Melanoma Father   . Colon cancer Neg Hx   . Breast cancer Neg Hx    Past Surgical History:  Procedure Laterality Date  . APPENDECTOMY    . KNEE ARTHROSCOPY Left   . ROBOTIC ASSISTED TOTAL HYSTERECTOMY WITH BILATERAL SALPINGO OOPHERECTOMY N/A 12/03/2013   Procedure: ROBOTIC ASSISTED TOTAL HYSTERECTOMY WITH BILATERAL SALPINGO OOPHORECTOMY WITH BILATERAL LYMPH NODE DISSECTION;  Surgeon:  Imagene Gurney A. Alycia Rossetti, MD;  Location: [facility];  Service: Gynecology;  Laterality: N/A;  . TUBAL LIGATION     Social History   Occupational History  . Not on file  Tobacco Use  . Smoking status: Never Smoker  . Smokeless tobacco: Never Used  Substance and Sexual Activity  . Alcohol use: No    Alcohol/week: 0.0 oz    Frequency: Never  . Drug use: No  . Sexual activity: Yes

## 2018-03-26 NOTE — Progress Notes (Signed)
   Numeric Pain Rating Scale and Functional Assessment Average Pain 10   In the last MONTH (on 0-10 scale) has pain interfered with the following?  1. General activity like being  able to carry out your everyday physical activities such as walking, climbing stairs, carrying groceries, or moving a chair?  Rating(8)   +Driver, -BT, -Dye Allergies.  

## 2018-03-29 MED FILL — ESTRADIOL 0.1 MG/GM CRM: 0.1 | 90 days supply | Qty: 43 | Fill #4

## 2018-05-08 DIAGNOSIS — Z01419 Encounter for gynecological examination (general) (routine) without abnormal findings: Secondary | ICD-10-CM | POA: Diagnosis not present

## 2018-05-08 DIAGNOSIS — Z8542 Personal history of malignant neoplasm of other parts of uterus: Secondary | ICD-10-CM | POA: Diagnosis not present

## 2018-06-12 MED FILL — ESTRADIOL 0.1 MG/GM CREA: 0.1 | 90 days supply | Qty: 43 | Fill #0

## 2018-06-21 ENCOUNTER — Telehealth (INDEPENDENT_AMBULATORY_CARE_PROVIDER_SITE_OTHER): Payer: Self-pay | Admitting: Physical Medicine and Rehabilitation

## 2018-06-21 NOTE — Telephone Encounter (Signed)
Left message for patient to call back to discuss/ schedule. 

## 2018-06-21 NOTE — Telephone Encounter (Signed)
If helped A LOT then repeat, if same pain but not much relief could do two level facet or OV

## 2018-06-22 NOTE — Telephone Encounter (Signed)
ESI really helped relieve her pain. Scheduled for repeat on 07/03/18 at 1315.

## 2018-06-25 ENCOUNTER — Other Ambulatory Visit: Payer: Self-pay | Admitting: Internal Medicine

## 2018-06-25 DIAGNOSIS — Z1231 Encounter for screening mammogram for malignant neoplasm of breast: Secondary | ICD-10-CM

## 2018-06-25 MED FILL — LEVOTHYROXINE 75 MCG TABLET: 75 | 90 days supply | Qty: 90 | Fill #2

## 2018-07-03 ENCOUNTER — Ambulatory Visit (INDEPENDENT_AMBULATORY_CARE_PROVIDER_SITE_OTHER): Payer: 59 | Admitting: Physical Medicine and Rehabilitation

## 2018-07-03 ENCOUNTER — Ambulatory Visit (INDEPENDENT_AMBULATORY_CARE_PROVIDER_SITE_OTHER): Payer: Self-pay

## 2018-07-03 ENCOUNTER — Encounter (INDEPENDENT_AMBULATORY_CARE_PROVIDER_SITE_OTHER): Payer: Self-pay | Admitting: Physical Medicine and Rehabilitation

## 2018-07-03 VITALS — BP 147/91 | Temp 97.4°F

## 2018-07-03 DIAGNOSIS — M5416 Radiculopathy, lumbar region: Secondary | ICD-10-CM

## 2018-07-03 MED ORDER — METHYLPREDNISOLONE ACETATE 80 MG/ML IJ SUSP
80.0000 mg | Freq: Once | INTRAMUSCULAR | Status: AC
  Administered 2018-07-03: 80 mg

## 2018-07-03 NOTE — Progress Notes (Signed)
Pt states pain in right leg that radiates into the right foot. Pt states pain started back May 25, 2018. Pt states laying down and getting comfortable makes pain worse, nothing helps with pain.   .Numeric Pain Rating Scale and Functional Assessment Average Pain 10   In the last MONTH (on 0-10 scale) has pain interfered with the following?  1. General activity like being  able to carry out your everyday physical activities such as walking, climbing stairs, carrying groceries, or moving a chair?  Rating(8)   +Driver, -BT, -Dye Allergies.

## 2018-07-03 NOTE — Procedures (Signed)
Lumbar Epidural Steroid Injection - Interlaminar Approach with Fluoroscopic Guidance  Patient: Mandy Holmes      Date of Birth: [birth year] MRN: [id number] PCP: Aldine Contes, MD      Visit Date: 07/03/2018   Universal Protocol:     Consent Given By: the patient  Position: PRONE  Additional Comments: Vital signs were monitored before and after the procedure. Patient was prepped and draped in the usual sterile fashion. The correct patient, procedure, and site was verified.   Injection Procedure Details:  Procedure Site One Meds Administered:  Meds ordered this encounter  Medications  . methylPREDNISolone acetate (DEPO-MEDROL) injection 80 mg     Laterality: Right  Location/Site:  L5-S1  Needle size: 20 G  Needle type: Tuohy  Needle Placement: Paramedian epidural  Findings:   -Comments: Excellent flow of contrast into the epidural space.  Procedure Details: Using a paramedian approach from the side mentioned above, the region overlying the inferior lamina was localized under fluoroscopic visualization and the soft tissues overlying this structure were infiltrated with 4 ml. of 1% Lidocaine without Epinephrine. The Tuohy needle was inserted into the epidural space using a paramedian approach.   The epidural space was localized using loss of resistance along with lateral and bi-planar fluoroscopic views.  After negative aspirate for air, blood, and CSF, a 2 ml. volume of Isovue-250 was injected into the epidural space and the flow of contrast was observed. Radiographs were obtained for documentation purposes.    The injectate was administered into the level noted above.   Additional Comments:  The patient tolerated the procedure well Dressing: Band-Aid    Post-procedure details: Patient was observed during the procedure. Post-procedure instructions were reviewed.  Patient left the clinic in stable condition.

## 2018-07-03 NOTE — Patient Instructions (Signed)

## 2018-07-03 NOTE — Progress Notes (Signed)
Mandy Holmes - 67 y.o. female MRN [id number]  Date of birth: August 14, 1951  Office Visit Note: Visit Date: 07/03/2018 PCP: Aldine Contes, MD Referred by: Aldine Contes, MD  Subjective: Chief Complaint  Patient presents with  . Right Leg - Pain   HPI: Mandy Holmes is a 67 year old female with a right L5-S1 injection from an interlaminar approach in June of this year with almost 100% relief until a month ago.  Pain has started back in the right leg radiation of the right foot and more of an L5 and S1 distribution.  She rates her pain as a 10 out of 10 with nothing helping the pain.  She been trying some anti-inflammatories with no relief.  She reports this is the same pain pattern she had before.  No trauma.  No focal weakness no bowel bladder changes.  Interestingly chronic left more than right lower extremity edema which is quite pronounced evidently was significantly improved after the epidural injection.  She states she was almost more excited about that than the pain relief.  We are going to go ahead and repeat the injection today we will see how long relief she gets with this.  Consideration would be given to transforaminal approach versus facet joint block.   ROS Otherwise per HPI.  Assessment & Plan: Visit Diagnoses:  1. Lumbar radiculopathy     Plan: No additional findings.   Meds & Orders:  Meds ordered this encounter  Medications  . methylPREDNISolone acetate (DEPO-MEDROL) injection 80 mg    Orders Placed This Encounter  Procedures  . XR C-ARM NO REPORT  . Epidural Steroid injection    Follow-up: Return if symptoms worsen or fail to improve.   Procedures: No procedures performed  Lumbar Epidural Steroid Injection - Interlaminar Approach with Fluoroscopic Guidance  Patient: Mandy Holmes      Date of Birth: 11-29-1950 MRN: [id number] PCP: Aldine Contes, MD      Visit Date: 07/03/2018   Universal Protocol:     Consent Given By: the  patient  Position: PRONE  Additional Comments: Vital signs were monitored before and after the procedure. Patient was prepped and draped in the usual sterile fashion. The correct patient, procedure, and site was verified.   Injection Procedure Details:  Procedure Site One Meds Administered:  Meds ordered this encounter  Medications  . methylPREDNISolone acetate (DEPO-MEDROL) injection 80 mg     Laterality: Right  Location/Site:  L5-S1  Needle size: 20 G  Needle type: Tuohy  Needle Placement: Paramedian epidural  Findings:   -Comments: Excellent flow of contrast into the epidural space.  Procedure Details: Using a paramedian approach from the side mentioned above, the region overlying the inferior lamina was localized under fluoroscopic visualization and the soft tissues overlying this structure were infiltrated with 4 ml. of 1% Lidocaine without Epinephrine. The Tuohy needle was inserted into the epidural space using a paramedian approach.   The epidural space was localized using loss of resistance along with lateral and bi-planar fluoroscopic views.  After negative aspirate for air, blood, and CSF, a 2 ml. volume of Isovue-250 was injected into the epidural space and the flow of contrast was observed. Radiographs were obtained for documentation purposes.    The injectate was administered into the level noted above.   Additional Comments:  The patient tolerated the procedure well Dressing: Band-Aid    Post-procedure details: Patient was observed during the procedure. Post-procedure instructions were reviewed.  Patient left the clinic in stable condition.  Clinical History: MRI LUMBAR SPINE WITHOUT CONTRAST  TECHNIQUE: Multiplanar, multisequence MR imaging of the lumbar spine was performed. No intravenous contrast was administered.  COMPARISON:  Lumbar spine radiographs September 13, 2017 at 1409 hours and MRI lumbar spine June 16, 2015  FINDINGS: SEGMENTATION: For the purposes of this report, the last well-formed intervertebral disc is reported as L5-S1.  ALIGNMENT: Maintained lumbar lordosis. No malalignment.  VERTEBRAE:Vertebral bodies are intact. Moderate to severe L2-3 and L3-4 disc height loss, mildly progressed from prior MRI. Similar mild L4-5 and L5-S1 disc height loss. Mild disc desiccation all lumbar levels. Multilevel mild chronic discogenic endplate changes without acute or abnormal bone marrow signal. Scattered old Schmorl's nodes.  CONUS MEDULLARIS AND CAUDA EQUINA: Conus medullaris terminates at T12-L1 and demonstrates normal morphology and signal characteristics. Cauda equina is normal.  PARASPINAL AND OTHER SOFT TISSUES: Included prevertebral and paraspinal soft tissues are normal.  DISC LEVELS:  L1-2: No disc bulge, canal stenosis nor neural foraminal narrowing.  L2-3: Similar 3 mm broad-based disc bulge. No canal stenosis. Mild bilateral caudal neural foraminal narrowing.  L3-4: Similar to slightly decreased 2 mm broad-based disc bulge asymmetric to the LEFT could affect the exited LEFT L3 nerve, slightly worse. Mild facet arthropathy and ligamentum flavum redundancy without canal stenosis. Mild to moderate bilateral neural foraminal narrowing.  L4-5: Similar 2 mm broad-based disc bulge. Similar moderate to severe facet arthropathy and ligamentum flavum redundancy. Minimal canal stenosis in trefoil configuration. Mild RIGHT neural foraminal narrowing.  L5-S1: Similar 2 mm broad-based disc bulge. Moderate to severe facet arthropathy without canal stenosis. Mild RIGHT neural foraminal narrowing.  IMPRESSION: 1. Mildly progressed degenerative change of the lumbar spine without acute osseous process. 2. Minimal canal stenosis L4-5. 3. Neural foraminal narrowing L3-4 through L5-S1: Mild to moderate at L3-4. Possible exited LEFT L3 nerve impingement. 4. These results  will be called to the ordering clinician or representative by the Radiology Department at the imaging location.   Electronically Signed   By: Elon Alas M.D.   On: 09/13/2017 20:10   She reports that she has never smoked. She has never used smokeless tobacco. No results for input(s): HGBA1C, LABURIC in the last 8760 hours.  Objective:  VS:  HT:    WT:   BMI:     BP:(!) 147/91  HR: bpm  TEMP:(!) 97.4 F (36.3 C)(Oral)  RESP:  Physical Exam  Ortho Exam Imaging: Xr C-arm No Report  Result Date: 07/03/2018 Please see Notes tab for imaging impression.   Past Medical/Family/Surgical/Social History: Medications & Allergies reviewed per EMR, new medications updated. Patient Active Problem List   Diagnosis Date Noted  . Obesity 01/29/2018  . Hypercalcemia 09/15/2017  . Elevated blood pressure reading 09/15/2017  . Dyspareunia, female 12/22/2015  . Preventative health care 05/19/2015  . History of estrogen deficiency 05/19/2015  . Chronic fatigue 05/19/2015  . Hypothyroidism 05/19/2015  . Uterine cancer ([place]) 11/07/2013   Past Medical History:  Diagnosis Date  . Cancer (HCC)    endometrial  . Constipation   . Headache(784.0)    migraines  . Hypothyroidism   . Migraine   . Sleep apnea    CPAP  . Venous insufficiency    bilateral lower legs   Family History  Problem Relation Age of Onset  . Uterine cancer Mother   . Stroke Mother   . Melanoma Brother   . Heart disease Brother   . Diabetes Father   . Melanoma Father   . Colon cancer  Neg Hx   . Breast cancer Neg Hx    Past Surgical History:  Procedure Laterality Date  . APPENDECTOMY    . KNEE ARTHROSCOPY Left   . ROBOTIC ASSISTED TOTAL HYSTERECTOMY WITH BILATERAL SALPINGO OOPHERECTOMY N/A 12/03/2013   Procedure: ROBOTIC ASSISTED TOTAL HYSTERECTOMY WITH BILATERAL SALPINGO OOPHORECTOMY WITH BILATERAL LYMPH NODE DISSECTION;  Surgeon: Imagene Gurney A. Alycia Rossetti, MD;  Location: [facility];  Service: Gynecology;   Laterality: N/A;  . TUBAL LIGATION     Social History   Occupational History  . Not on file  Tobacco Use  . Smoking status: Never Smoker  . Smokeless tobacco: Never Used  Substance and Sexual Activity  . Alcohol use: No    Alcohol/week: 0.0 standard drinks    Frequency: Never  . Drug use: No  . Sexual activity: Yes

## 2018-07-26 ENCOUNTER — Ambulatory Visit
Admission: RE | Admit: 2018-07-26 | Discharge: 2018-07-26 | Disposition: A | Discharge status: Home / Self Care | Payer: 59 | Source: Ambulatory Visit | Attending: Internal Medicine | Admitting: Internal Medicine

## 2018-07-26 DIAGNOSIS — Z1231 Encounter for screening mammogram for malignant neoplasm of breast: Secondary | ICD-10-CM | POA: Diagnosis not present

## 2018-07-31 NOTE — Progress Notes (Signed)
Follow Up Note: Gyn-Onc    CC:  Chief Complaint  Patient presents with  . Endometrial cancer [facility])  GYN Oncologic Summary 1. 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 Family history is notable for mother diagnosed with uterine cancer at age 68 and a brother with ?melanoma versus other skin CA\  Declines genetic counseling for now  HPI:  Mandy Holmes is a 67 y.o.    Interval History:   She saw Dr Alycia Rossetti ~ 1 year ago. Has seen Dr. Harrington Challenger since then and states pap and exam WNL. No complaints today. Denies bleeding/pain.   Oncologic Course She had been menopausal 10 years and had been on the Vivelle-Dot and progesterone for hormonal therapy since 2001. She reported vaginal bleeding in 01/2012. Evaluation at that time was notable for benign weakly proliferative endometrium with no atypia or hyperplasia. She did well until 9 months prior to presentation (in 2014) when she noted irregular bleeding. She presented for evaluation and an endometrial biopsy 10/22/2013 was notable for endometrial adenocarcinoma FIGO grade 1 and a benign endocervical polyp.  On 12/03/13, she underwent a total robotic hysterectomy, bilateral salpingo-oophorectomy, bilateral pelvic lymph node dissection.  Her post-operative course was uneventful.  Final pathology revealed:  1. 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   - CERVIX: BENIGN SQUAMOUS MUCOSA AND ENDOCERVICAL MUCOSA, NO DYSPLASIA OR MALIGNANCY.  - BILATERAL OVARIES AND FALLOPIAN TUBES: BENIGN OVARIAN TISSUE AND FALLOPIAN TUBAL TISSUE, NO EVIDENCE OF MALIGNANCY.  2. Lymph nodes, regional resection, right pelvic - SIX LYMPH NODES, NEGATIVE FOR METASTATIC CARCINOMA (0/6).  3. Lymph nodes, regional resection, left pelvic - THREE LYMPH NODES, NEGATIVE FOR METASTATIC CARCINOMA  (0/3).  Current Meds:  Outpatient Encounter Medications as of 08/01/2018  Medication Sig  . Ascorbic Acid (VITAMIN C) 1000 MG tablet Take 3,000 mg by mouth daily.  Marland Kitchen b complex vitamins tablet Take 1 tablet by mouth daily.  . Cyanocobalamin (VITAMIN B-12 PO) Take 5,000 mcg by mouth daily.  Marland Kitchen ESTRACE VAGINAL 0.1 MG/GM vaginal cream 2 (two) times a week. Patient states she uses this medication on Wednesday and Sunday.  . L-LYSINE PO Take 1,000 mg by mouth daily.   Marland Kitchen levothyroxine (SYNTHROID, LEVOTHROID) 75 MCG tablet TAKE 1 TABLET BY MOUTH DAILY BEFORE BREAKFAST.  Marland Kitchen MAGNESIUM GLYCINATE PLUS PO Take 400 mg by mouth daily.   . Omega-3 Fatty Acids (FISH OIL PO) Take 1,400 mg by mouth daily.   Marland Kitchen OVER THE COUNTER MEDICATION BC POWDERS FOR MIGRAINE  . Probiotic Product (PROBIOTIC DAILY PO) Take 1 capsule by mouth 2 (two) times daily.  . [DISCONTINUED] Multiple Vitamin (MULTIVITAMIN) tablet Take 1 tablet by mouth daily.   No facility-administered encounter medications on file as of 08/01/2018.     Allergy: No Known Allergies  Social Hx:   Social History   Socioeconomic History  . Marital status: Married    Spouse name: Not on file  . Number of children: Not on file  . Years of education: Not on file  . Highest education level: Not on file  Occupational History  . Not on file  Social Needs  . Financial resource strain: Not on file  . Food insecurity:    Worry: Not on file    Inability: Not on file  . Transportation needs:    Medical: Not on file    Non-medical: Not on  file  Tobacco Use  . Smoking status: Never Smoker  . Smokeless tobacco: Never Used  Substance and Sexual Activity  . Alcohol use: No    Alcohol/week: 0.0 standard drinks    Frequency: Never  . Drug use: No  . Sexual activity: Yes  Lifestyle  . Physical activity:    Days per week: Not on file    Minutes per session: Not on file  . Stress: Not on file  Relationships  . Social connections:    Talks on phone:  Not on file    Gets together: Not on file    Attends religious service: Not on file    Active member of club or organization: Not on file    Attends meetings of clubs or organizations: Not on file    Relationship status: Not on file  . Intimate partner violence:    Fear of current or ex partner: Not on file    Emotionally abused: Not on file    Physically abused: Not on file    Forced sexual activity: Not on file  Other Topics Concern  . Not on file  Social History Narrative  . Not on file    Past Surgical Hx:  Past Surgical History:  Procedure Laterality Date  . APPENDECTOMY    . KNEE ARTHROSCOPY Left   . ROBOTIC ASSISTED TOTAL HYSTERECTOMY WITH BILATERAL SALPINGO OOPHERECTOMY N/A 12/03/2013   Procedure: ROBOTIC ASSISTED TOTAL HYSTERECTOMY WITH BILATERAL SALPINGO OOPHORECTOMY WITH BILATERAL LYMPH NODE DISSECTION;  Surgeon: Imagene Gurney A. Alycia Rossetti, MD;  Location: [facility];  Service: Gynecology;  Laterality: N/A;  . TUBAL LIGATION      Past Medical Hx:  Past Medical History:  Diagnosis Date  . Cancer (HCC)    endometrial  . Constipation   . Headache(784.0)    migraines  . Hypothyroidism   . Migraine   . Sleep apnea    CPAP  . Venous insufficiency    bilateral lower legs    Family Hx:  Family History  Problem Relation Age of Onset  . Uterine cancer Mother   . Stroke Mother   . Melanoma Brother   . Heart disease Brother   . Diabetes Father   . Melanoma Father   . Colon cancer Neg Hx   . Breast cancer Neg Hx    Review of Systems  Cardiovascular: Positive for leg swelling.  Gastrointestinal: Positive for constipation.  Endocrine: Positive for hot flashes.  Neurological: Positive for headaches.  All other systems reviewed and are negative.    Vitals:  Blood pressure (!) 143/68, pulse 84, temperature 98.6 F (37 C), temperature source Oral, resp. rate 20, height 5' 4"  (1.626 m), weight 188 lb (85.3 kg), SpO2 99 %.  Physical Exam: ECOG PERFORMANCE STATUS: 0 -  Asymptomatic   General :  Well developed, 67 y.o., female in no apparent distress HEENT:  Normocephalic/atraumatic, symmetric, EOMI, eyelids normal Neck:   Supple, no masses.  Lymphatics:  No cervical/ submandibular/ supraclavicular/ infraclavicular/ inguinal adenopathy Respiratory:  Respirations unlabored, no use of accessory muscles CV:   Deferred Breast:  Deferred Musculoskeletal: No CVA tenderness, normal muscle strength. Abdomen:  Trocar sites are CDI Soft, non-tender and nondistended. No evidence of hernia. No masses. Extremities:  No lymphedema, no erythema, non-tender. Skin:   Normal inspection Neuro/Psych:  No focal motor deficit, no abnormal mental status. Normal gait. Normal affect. Alert and oriented to person, place, and time  Genito Urinary: Vulva: Normal external female genitalia.  Bladder/urethra: Urethral meatus normal in  size and location. No lesions or   masses, well supported bladder Speculum exam:  Vagina: No lesion, no discharge, no bleeding. Bimanual exam: Cervix/Uterus/Adnexa: Surgically absent  Adnexa: No masses. Rectovaginal:  Good tone, no masses, no cul de sac nodularity, no parametrial involvement or nodularity.    Assessment Uterine CA  Plan:  1. Plan annual followup here (until 11/2018) 2. Referring Gyn, Dr. Harrington Challenger in 6 months 3. Dr. Alycia Rossetti discussed her Effexor last visit  Noting she was taking Effexor 37.5 mg 1 every other day. She was to start taking one every third day for 2 weeks and then she can taper off completely.   She has since stopped the Effexor 4. She has not had MMR done on her uterine specimen; she has family history and we discussed genetic counseling.  She declines at this time.   Face to face time with patient was 15 minutes. Over 50% of this time was spent on counseling and coordination of care.    Isabel Caprice, MD 08/01/2018, 1:52 PM Gynecologic Oncologist  Cc: Dr. Harrington Challenger (Referring Gyn) Aldine Contes, MD  (PCP)

## 2018-08-01 ENCOUNTER — Inpatient Hospital Stay: Payer: 59 | Attending: Obstetrics | Admitting: Obstetrics

## 2018-08-01 ENCOUNTER — Encounter: Payer: Self-pay | Admitting: Obstetrics

## 2018-08-01 VITALS — BP 143/68 | HR 84 | Temp 98.6°F | Resp 20 | Ht 64.0 in | Wt 188.0 lb

## 2018-08-01 DIAGNOSIS — Z90722 Acquired absence of ovaries, bilateral: Secondary | ICD-10-CM

## 2018-08-01 DIAGNOSIS — C541 Malignant neoplasm of endometrium: Secondary | ICD-10-CM | POA: Diagnosis not present

## 2018-08-01 DIAGNOSIS — Z90710 Acquired absence of both cervix and uterus: Secondary | ICD-10-CM

## 2018-08-01 NOTE — Patient Instructions (Signed)
1. Return in one year 2. Keep followup with Dr. Harrington Challenger ~ 6 months from our visit for Pap/pelvic

## 2018-09-13 MED FILL — ESTRADIOL 0.1 MG/GM CREA: 0.1 | 90 days supply | Qty: 43 | Fill #1

## 2018-09-19 MED FILL — LEVOTHYROXINE 75 MCG TABLET: 75 | 90 days supply | Qty: 90 | Fill #3

## 2018-11-06 ENCOUNTER — Encounter: Payer: Self-pay | Admitting: Internal Medicine

## 2018-11-06 ENCOUNTER — Ambulatory Visit: Payer: 59 | Admitting: Internal Medicine

## 2018-11-06 ENCOUNTER — Other Ambulatory Visit: Payer: Self-pay

## 2018-11-06 VITALS — BP 144/80 | HR 79 | Temp 98.5°F | Ht 64.0 in | Wt 189.7 lb

## 2018-11-06 DIAGNOSIS — Z0000 Encounter for general adult medical examination without abnormal findings: Secondary | ICD-10-CM | POA: Diagnosis not present

## 2018-11-06 DIAGNOSIS — E039 Hypothyroidism, unspecified: Secondary | ICD-10-CM

## 2018-11-06 DIAGNOSIS — X58XXXA Exposure to other specified factors, initial encounter: Secondary | ICD-10-CM | POA: Diagnosis not present

## 2018-11-06 DIAGNOSIS — E669 Obesity, unspecified: Secondary | ICD-10-CM

## 2018-11-06 DIAGNOSIS — S80819A Abrasion, unspecified lower leg, initial encounter: Secondary | ICD-10-CM | POA: Diagnosis not present

## 2018-11-06 DIAGNOSIS — C55 Malignant neoplasm of uterus, part unspecified: Secondary | ICD-10-CM

## 2018-11-06 DIAGNOSIS — R030 Elevated blood-pressure reading, without diagnosis of hypertension: Secondary | ICD-10-CM

## 2018-11-06 DIAGNOSIS — Z6831 Body mass index (BMI) 31.0-31.9, adult: Secondary | ICD-10-CM | POA: Diagnosis not present

## 2018-11-06 DIAGNOSIS — Z79890 Hormone replacement therapy (postmenopausal): Secondary | ICD-10-CM

## 2018-11-06 DIAGNOSIS — Z6832 Body mass index (BMI) 32.0-32.9, adult: Secondary | ICD-10-CM | POA: Diagnosis not present

## 2018-11-06 DIAGNOSIS — Z90722 Acquired absence of ovaries, bilateral: Secondary | ICD-10-CM

## 2018-11-06 DIAGNOSIS — Z8544 Personal history of malignant neoplasm of other female genital organs: Secondary | ICD-10-CM | POA: Diagnosis not present

## 2018-11-06 DIAGNOSIS — Z90710 Acquired absence of both cervix and uterus: Secondary | ICD-10-CM

## 2018-11-06 LAB — POCT GLYCOSYLATED HEMOGLOBIN (HGB A1C): HEMOGLOBIN A1C: 5.6 % (ref 4.0–5.6)

## 2018-11-06 LAB — GLUCOSE, CAPILLARY: Glucose-Capillary: 104 mg/dL — ABNORMAL HIGH (ref 70–99)

## 2018-11-06 NOTE — Assessment & Plan Note (Signed)
-  Patient does have a BMI of 32 -Her weight has been relatively stable between 186 and 190 over the last 3 years -Patient was encouraged to try to lose weight and exercise more as well as watching her diet -She was noted to have a borderline A1c of 5.9 in 2016.  We will recheck this again today -No further work-up at this time

## 2018-11-06 NOTE — Assessment & Plan Note (Signed)
-  Patient has a history of stage Ia endometrial cancer status post hysterectomy and bilateral salpingo-oophorectomy -Patient follows up with gyn/onc for this -We will attempt to obtain records from Dr. Harrington Challenger

## 2018-11-06 NOTE — Assessment & Plan Note (Signed)
-  Patient has a history of mild asymptomatic hypercalcemia -We will check a repeat calcium level today -No further work-up at this time

## 2018-11-06 NOTE — Progress Notes (Signed)
   Subjective:    Patient ID: Mandy Holmes, female    DOB: [birth year], 68 y.o.   MRN: [id number]  HPI  I have seen and examined this patient.  Patient is here for routine follow-up of her hypothyroidism as well as borderline blood pressure.  Patient does complain of a bump over her left abdomen that comes and goes.  She states that she cannot feel the bump today but that it was initially painful.  She has no pain currently.  Patient also complains of a scratch over her left lower extremity which she applied adhesive tape and Neosporin to and states that she noted some redness as well as a bump over the area and was worried that it was infected.  She also states that she is "freezing" all the time.  She denies any other complaints today and is compliant with her medications.  Review of Systems  Constitutional: Negative.   HENT: Negative.   Respiratory: Negative.   Cardiovascular: Negative.   Gastrointestinal: Negative.   Endocrine: Positive for cold intolerance.  Musculoskeletal: Negative.   Skin:       Scab over her left lower extremity with mild surrounding erythema  Neurological: Negative.   Psychiatric/Behavioral: Negative.        Objective:   Physical Exam Constitutional:      Appearance: Normal appearance.  HENT:     Head: Normocephalic and atraumatic.     Mouth/Throat:     Mouth: Mucous membranes are moist.     Pharynx: No oropharyngeal exudate.  Neck:     Musculoskeletal: Neck supple.  Cardiovascular:     Rate and Rhythm: Normal rate and regular rhythm.     Heart sounds: Normal heart sounds.  Pulmonary:     Effort: Pulmonary effort is normal.     Breath sounds: Normal breath sounds. No wheezing.  Abdominal:     General: Bowel sounds are normal. There is no distension.     Palpations: Abdomen is soft. There is no mass.     Tenderness: There is no abdominal tenderness.     Comments: Unable to palpate a mass over her left periumbilical area.  Musculoskeletal:         General: No swelling or tenderness.  Lymphadenopathy:     Cervical: No cervical adenopathy.  Skin:    Comments: Patient noted to have a scab over her left lower extremity with some mild surrounding erythema but no increased local warmth  Neurological:     General: No focal deficit present.     Mental Status: She is alert and oriented to person, place, and time.  Psychiatric:        Mood and Affect: Mood normal.        Behavior: Behavior normal.           Assessment & Plan:  Please see problem based charting for assessment and plan:

## 2018-11-06 NOTE — Assessment & Plan Note (Signed)
-  We will check hepatitis C antibody today -Patient already received a flu shot at [facility] -No further work-up at this time

## 2018-11-06 NOTE — Patient Instructions (Signed)
-  It is a pleasure seeing you again -We will check some blood work on you today including a thyroid function as well as your kidney function and calcium levels -Your blood pressure remains borderline.  We will hold off on antihypertensives for now.  If it remains elevated we may consider starting a blood pressure medication -Please call us if the scratch on your leg gets more red or if you start having fevers or if there is any pus collecting -Please call me if you have any questions -We will call in a refill of your levothyroxine based on the results of your blood test today

## 2018-11-06 NOTE — Assessment & Plan Note (Signed)
-  This problem is chronic and stable -Patient does complain of some cold intolerance and states that she is "freezing" all the time -We will check a TSH and free T4 today -Depending on results we will determine whether to continue the same dose or increase her dose of levothyroxine -No further work-up at this time

## 2018-11-06 NOTE — Assessment & Plan Note (Signed)
-  Patient's blood pressure has been borderline over the last year -Her systolic blood pressure today was in the 140s -Patient has been encouraged to try and lose weight -If her blood pressure remains consistently elevated we will consider starting an antihypertensive at her next visit

## 2018-11-07 LAB — BMP8+ANION GAP
Anion Gap: 18 mmol/L (ref 10.0–18.0)
BUN / CREAT RATIO: 20 (ref 12–28)
BUN: 15 mg/dL (ref 8–27)
CALCIUM: 10 mg/dL (ref 8.7–10.3)
CO2: 18 mmol/L — ABNORMAL LOW (ref 20–29)
Chloride: 105 mmol/L (ref 96–106)
Creatinine, Ser: 0.74 mg/dL (ref 0.57–1.00)
GFR calc non Af Amer: 84 mL/min/{1.73_m2} (ref >59)
GFR, EST AFRICAN AMERICAN: 96 mL/min/{1.73_m2} (ref >59)
Glucose: 113 mg/dL — ABNORMAL HIGH (ref 65–99)
Potassium: 4.6 mmol/L (ref 3.5–5.2)
Sodium: 141 mmol/L (ref 134–144)

## 2018-11-07 LAB — T4, FREE: Free T4: 1.41 ng/dL (ref 0.82–1.77)

## 2018-11-07 LAB — HEPATITIS C ANTIBODY: Hep C Virus Ab: <0.1 s/co ratio (ref 0.0–0.9)

## 2018-11-07 LAB — TSH: TSH: 0.741 u[IU]/mL (ref 0.450–4.500)

## 2018-11-08 ENCOUNTER — Encounter: Payer: Self-pay | Admitting: Internal Medicine

## 2018-11-08 ENCOUNTER — Telehealth: Payer: Self-pay

## 2018-11-08 NOTE — Telephone Encounter (Signed)
Requesting lab results, please call pt back.  

## 2018-11-08 NOTE — Telephone Encounter (Signed)
Spoke w/ pt relayed dr Wilber Bihari message

## 2018-11-08 NOTE — Telephone Encounter (Signed)
I called the patient earlier today as well as on receiving this message. It keeps going to her voicemail. I have printed out a letter to send. If you can try and call her as well and let her know that all her bloodwork was normal including her thyroid function and kidney function as well as her A1C. Thank you

## 2018-11-09 NOTE — Telephone Encounter (Signed)
Thank you :)

## 2018-12-10 MED FILL — ESTRADIOL 0.1 MG/GM CREA: 0.1 | 90 days supply | Qty: 43 | Fill #2

## 2018-12-14 IMAGING — MR MR LUMBAR SPINE W/O CM
4 of 5 series · 18 of 48 positions shown · non-contrast
Comparison: Lumbar spine radiographs September 13, 2017 at 1279 hours
and MRI lumbar spine June 16, 2015

CLINICAL DATA: Acute onset of RIGHT low back pain, RIGHT sciatica.

EXAM:
MRI LUMBAR SPINE WITHOUT CONTRAST
TECHNIQUE: Multiplanar, multisequence MR imaging of the lumbar spine was
performed. No intravenous contrast was administered.

[Series 2: T1 · sagittal · 4.0mm · 0.51mm/px · 3 of 12 slices shown (1 of 2)]
[im 3/12]
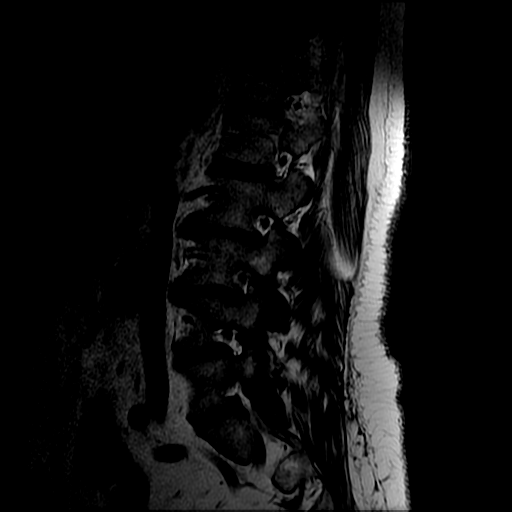
[im 7/12]
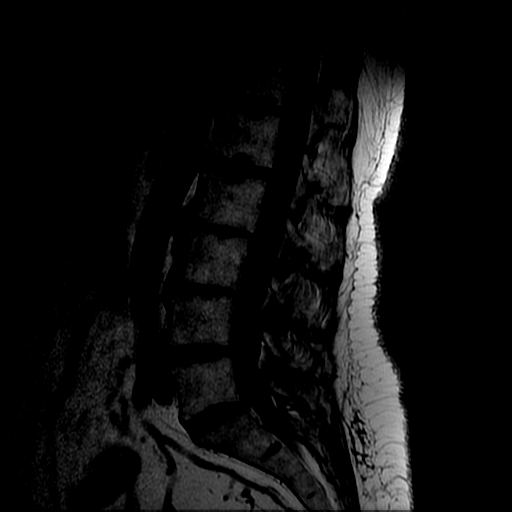
[im 12/12]
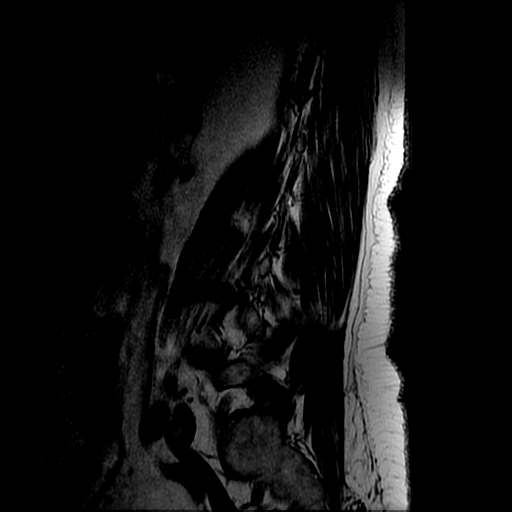

[Series 4: T2 · sagittal · 4.0mm · 0.51mm/px · 6 of 12 slices shown (1 of 2)]
[im 1/12]
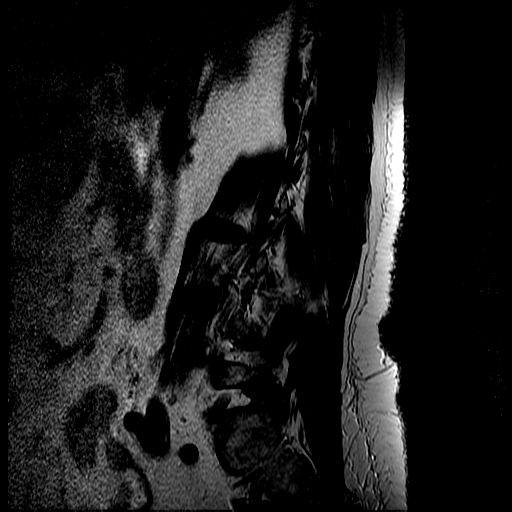
[im 3/12]
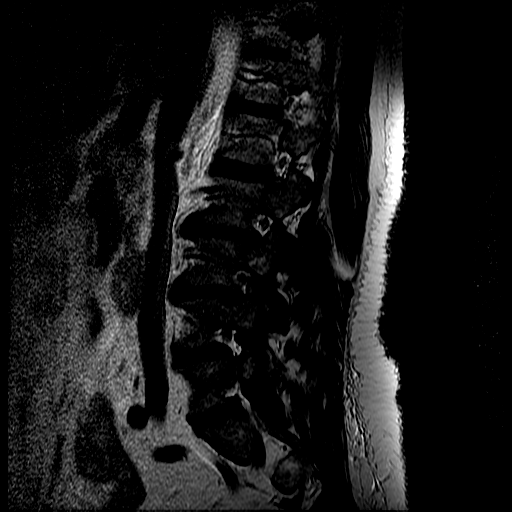
[im 5/12]
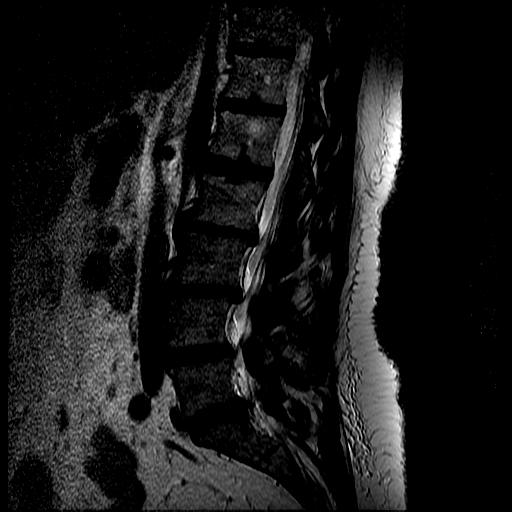
[im 7/12]
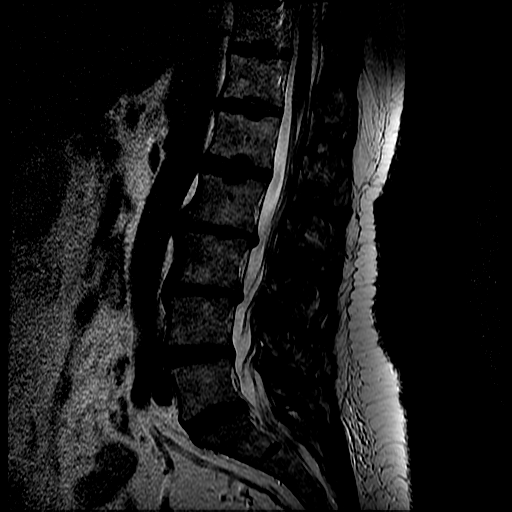
[im 9/12]
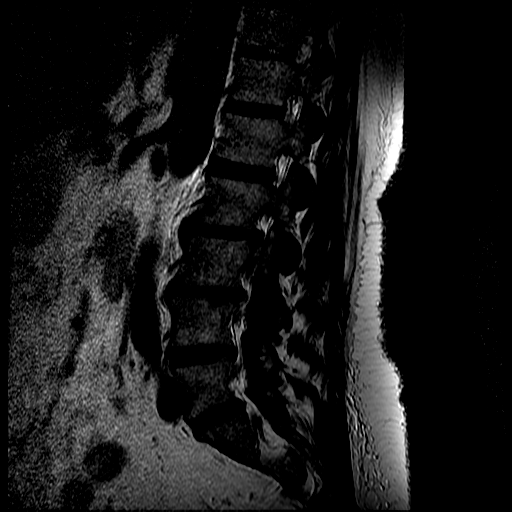
[im 12/12]
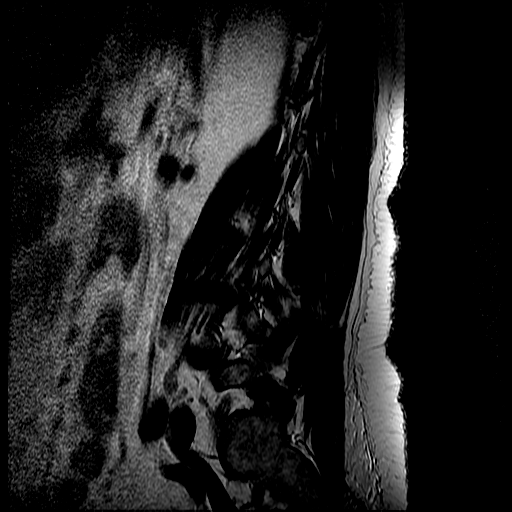

[Series 6: T2 · axial · 4.0mm · 0.39mm/px · z∈[-64,+81]mm · 6 of 30 slices shown (2 of 2)]
[im 1/30]
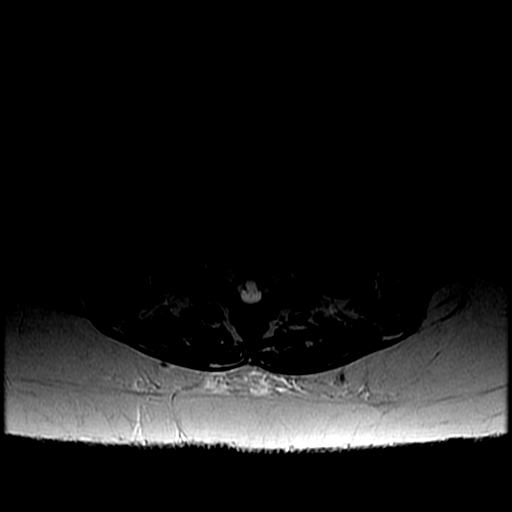
[im 5/30]
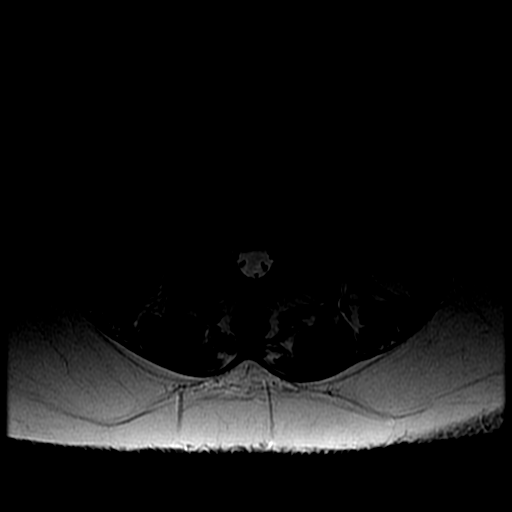
[im 9/30]
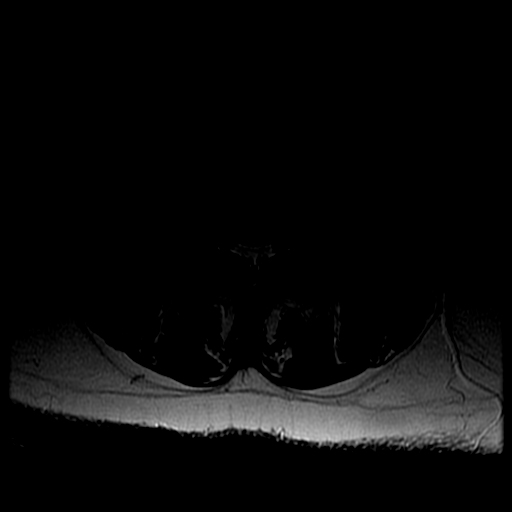
[im 13/30]
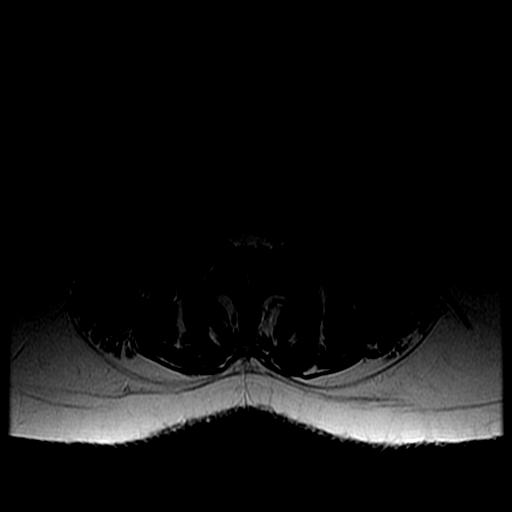
[im 15/30]
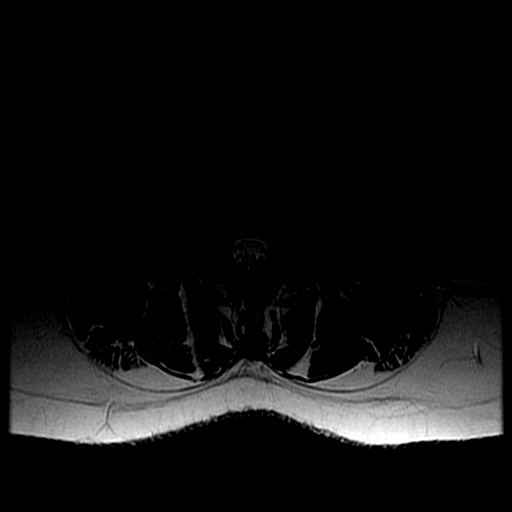
[im 25/30]
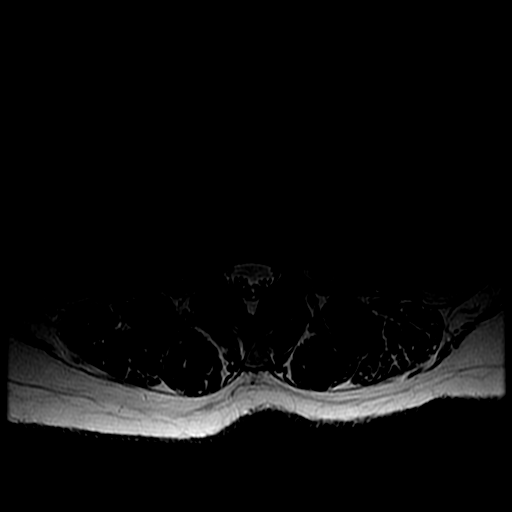

[Series 7: T1 · axial · 4.0mm · 0.39mm/px · z∈[-45,+81]mm · 3 of 30 slices shown (2 of 2)]
[im 5/30]
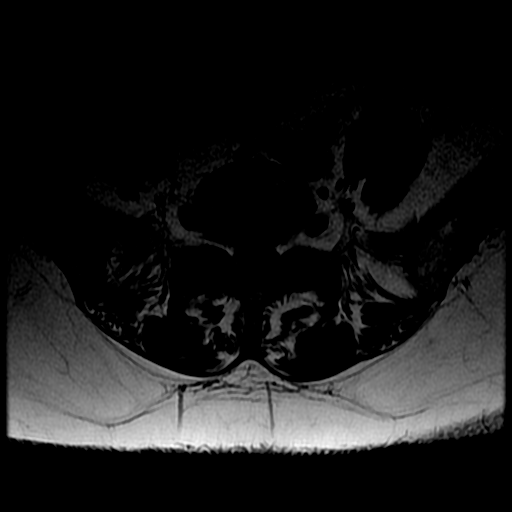
[im 15/30]
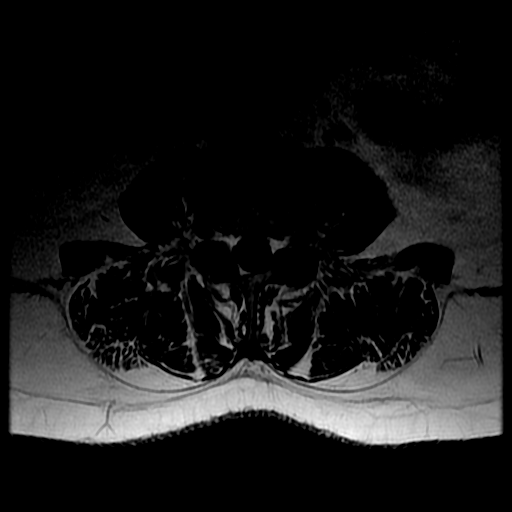
[im 25/30]
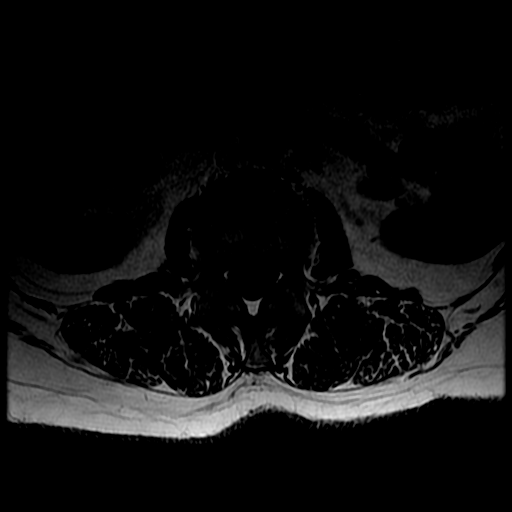

[18 of 48 positions shown; findings below may reference images not displayed]

FINDINGS: SEGMENTATION: For the purposes of this report, the last well-formed
intervertebral disc is reported as L5-S1.

ALIGNMENT: Maintained lumbar lordosis. No malalignment.

VERTEBRAE:Vertebral bodies are intact. Moderate to severe L2-3 and
L3-4 disc height loss, mildly progressed from prior MRI. Similar
mild L4-5 and L5-S1 disc height loss. Mild disc desiccation all
lumbar levels. Multilevel mild chronic discogenic endplate changes
without acute or abnormal bone marrow signal. Scattered old
Schmorl's nodes.

CONUS MEDULLARIS AND CAUDA EQUINA: Conus medullaris terminates at
T12-L1 and demonstrates normal morphology and signal
characteristics. Cauda equina is normal.

PARASPINAL AND OTHER SOFT TISSUES: Included prevertebral and
paraspinal soft tissues are normal.

DISC LEVELS:

L1-2: No disc bulge, canal stenosis nor neural foraminal narrowing.

L2-3: Similar 3 mm broad-based disc bulge. No canal stenosis. Mild
bilateral caudal neural foraminal narrowing.

L3-4: Similar to slightly decreased 2 mm broad-based disc bulge
asymmetric to the LEFT could affect the exited LEFT L3 nerve,
slightly worse. Mild facet arthropathy and ligamentum flavum
redundancy without canal stenosis. Mild to moderate bilateral neural
foraminal narrowing.

L4-5: Similar 2 mm broad-based disc bulge. Similar moderate to
severe facet arthropathy and ligamentum flavum redundancy. Minimal
canal stenosis in trefoil configuration. Mild RIGHT neural foraminal
narrowing.

L5-S1: Similar 2 mm broad-based disc bulge. Moderate to severe facet
arthropathy without canal stenosis. Mild RIGHT neural foraminal
narrowing.
IMPRESSION: 1. Mildly progressed degenerative change of the lumbar spine without
acute osseous process.
2. Minimal canal stenosis L4-5.
3. Neural foraminal narrowing L3-4 through L5-S1: Mild to moderate
at L3-4. Possible exited LEFT L3 nerve impingement.
4. These results will be called to the ordering clinician or
representative by the [HOSPITAL] at the imaging location.

## 2018-12-17 DIAGNOSIS — H52223 Regular astigmatism, bilateral: Secondary | ICD-10-CM | POA: Diagnosis not present

## 2018-12-17 DIAGNOSIS — H524 Presbyopia: Secondary | ICD-10-CM | POA: Diagnosis not present

## 2018-12-17 DIAGNOSIS — H2513 Age-related nuclear cataract, bilateral: Secondary | ICD-10-CM | POA: Diagnosis not present

## 2018-12-17 DIAGNOSIS — H5203 Hypermetropia, bilateral: Secondary | ICD-10-CM | POA: Diagnosis not present

## 2018-12-21 ENCOUNTER — Other Ambulatory Visit: Payer: Self-pay | Admitting: Internal Medicine

## 2018-12-21 MED ORDER — LEVOTHYROXINE SODIUM 75 MCG PO TABS: ORAL_TABLET | ORAL | 1 refills | Status: DC

## 2018-12-21 NOTE — Telephone Encounter (Signed)
Needs refill on levothyroxine (SYNTHROID, LEVOTHROID) 75 MCG tablet  [place], [place] - [address] [facility]. ;pt contact [phone]

## 2018-12-22 MED FILL — LEVOTHYROXINE 75 MCG TABLET: 75 | 90 days supply | Qty: 90 | Fill #0

## 2019-02-06 DIAGNOSIS — G4733 Obstructive sleep apnea (adult) (pediatric): Secondary | ICD-10-CM | POA: Diagnosis not present

## 2019-03-05 MED FILL — ESTRADIOL 0.1 MG/GM CREA: 0.1 | 90 days supply | Qty: 43 | Fill #3

## 2019-03-19 MED FILL — LEVOTHYROXINE 75 MCG TABLET: 75 | 90 days supply | Qty: 90 | Fill #1

## 2019-04-23 ENCOUNTER — Encounter: Payer: Self-pay | Admitting: Internal Medicine

## 2019-04-23 ENCOUNTER — Ambulatory Visit (INDEPENDENT_AMBULATORY_CARE_PROVIDER_SITE_OTHER): Payer: 59 | Admitting: Internal Medicine

## 2019-04-23 ENCOUNTER — Other Ambulatory Visit: Payer: Self-pay

## 2019-04-23 VITALS — BP 137/74 | HR 81 | Temp 98.2°F | Ht 64.0 in | Wt 191.2 lb

## 2019-04-23 DIAGNOSIS — E039 Hypothyroidism, unspecified: Secondary | ICD-10-CM

## 2019-04-23 DIAGNOSIS — R030 Elevated blood-pressure reading, without diagnosis of hypertension: Secondary | ICD-10-CM | POA: Diagnosis not present

## 2019-04-23 DIAGNOSIS — Z0000 Encounter for general adult medical examination without abnormal findings: Secondary | ICD-10-CM

## 2019-04-23 DIAGNOSIS — E669 Obesity, unspecified: Secondary | ICD-10-CM

## 2019-04-23 DIAGNOSIS — Z79890 Hormone replacement therapy (postmenopausal): Secondary | ICD-10-CM

## 2019-04-23 DIAGNOSIS — Z90710 Acquired absence of both cervix and uterus: Secondary | ICD-10-CM

## 2019-04-23 DIAGNOSIS — Z90722 Acquired absence of ovaries, bilateral: Secondary | ICD-10-CM | POA: Diagnosis not present

## 2019-04-23 DIAGNOSIS — Z8542 Personal history of malignant neoplasm of other parts of uterus: Secondary | ICD-10-CM

## 2019-04-23 DIAGNOSIS — Z6832 Body mass index (BMI) 32.0-32.9, adult: Secondary | ICD-10-CM | POA: Diagnosis not present

## 2019-04-23 DIAGNOSIS — Z6831 Body mass index (BMI) 31.0-31.9, adult: Secondary | ICD-10-CM

## 2019-04-23 NOTE — Patient Instructions (Signed)
Annual Wellness Visit   Medicare Covered Preventative Screenings and Services  Services & Screenings Men and Women Who How Often Need? Date of Last Service Action  Abdominal Aortic Aneurysm Adults with AAA risk factors Once     Alcohol Misuse and Counseling All Adults Screening once a year if no alcohol misuse. Counseling up to 4 face to face sessions.     Bone Density Measurement  Adults at risk for osteoporosis Once every 2 yrs     Lipid Panel Z13.6 All adults without CV disease Once every 5 yrs     Colorectal Cancer   Stool sample or  Colonoscopy All adults 35 and older   Once every year  Every 10 years     Depression All Adults Once a year  Today   Diabetes Screening Blood glucose, post glucose load, or GTT Z13.1  All adults at risk  Pre-diabetics  Once per year  Twice per year     Diabetes  Self-Management Training All adults Diabetics 10 hrs first year; 2 hours subsequent years. Requires Copay     Glaucoma  Diabetics  Family history of glaucoma  African Americans 69 yrs +  Hispanic Americans 54 yrs + Annually - requires coppay     Hepatitis C Z72.89 or F19.20  High Risk for HCV  Born between 1945 and 1965  Annually  Once     HIV Z11.4 All adults based on risk  Annually btw ages 85 & 87 regardless of risk  Annually > 65 yrs if at increased risk     Lung Cancer Screening Asymptomatic adults aged 54-77 with 30 pack yr history and current smoker OR quit within the last 15 yrs Annually Must have counseling and shared decision making documentation before first screen     Medical Nutrition Therapy Adults with   Diabetes  Renal disease  Kidney transplant within past 3 yrs 3 hours first year; 2 hours subsequent years     Obesity and Counseling All adults Screening once a year Counseling if BMI 30 or higher  Today   Tobacco Use Counseling Adults who use tobacco  Up to 8 visits in one year     Vaccines Z23  Hepatitis B  Influenza   Pneumonia  Adults    Once  Once every flu season  Two different vaccines separated by one year     Next Annual Wellness Visit People with Medicare Every year  Today     Services & Screenings Women Who How Often Need  Date of Last Service Action  Mammogram  Z12.31 Women over 15 One baseline ages 44-39. Annually ager 40 yrs+     Pap tests All women Annually if high risk. Every 2 yrs for normal risk women     Screening for cervical cancer with   Pap (Z01.419 nl or Z01.411abnl) &  HPV Z11.51 Women aged 44 to 54 Once every 5 yrs     Screening pelvic and breast exams All women Annually if high risk. Every 2 yrs for normal risk women     Sexually Transmitted Diseases  Chlamydia  Gonorrhea  Syphilis All at risk adults Annually for non pregnant females at increased risk         [place] Men Who How Ofter Need  Date of Last Service Action  Prostate Cancer - DRE & PSA Men over 50 Annually.  DRE might require a copay.     Sexually Transmitted Diseases  Syphilis All at risk adults Annually  for men at increased risk         Things That May Be Affecting Your Health:  Alcohol  Hearing loss  Pain    Depression  Home Safety  Sexual Health   Diabetes  Lack of physical activity  Stress   Difficulty with daily activities  Loneliness  Tiredness   Drug use  Medicines  Tobacco use   Falls  Motor Vehicle Safety  Weight   Food choices  Oral Health  Other    YOUR PERSONALIZED HEALTH PLAN : 1. Schedule your next subsequent Medicare Wellness visit in one year 2. Attend all of your regular appointments to address your medical issues 3. Complete the preventative screenings and services 4. Please continue to try and lose weight. Try and exercise for atleast 30 minutes a day and eating a healthy diet.

## 2019-04-23 NOTE — Assessment & Plan Note (Signed)
-  Patient is up-to-date on her screenings and vaccinations -No further work-up at this time

## 2019-04-23 NOTE — Assessment & Plan Note (Signed)
-  Patient has a history of endometrial cancer (stage Ia) status post hysterectomy and bilateral salpingo-oophorectomy -She denies any complaints currently -Patient has a follow-up with her gynecologist this month and follow-up with her gyn/onc in October -No further work-up at this time

## 2019-04-23 NOTE — Assessment & Plan Note (Signed)
-  Patient blood pressure is at goal today -Patient was educated about the importance of weight loss -No indication for medication at this time -We will continue to monitor

## 2019-04-23 NOTE — Assessment & Plan Note (Signed)
-  Patient noted to have a BMI of 32 today -We discussed importance of daily exercise of 30 minutes a day as well as a healthy diet -Patient expressed understanding and states that she will work on this and believes that her hypothyroidism may be playing a role in this as well -We will monitor her thyroid function and adjust her Synthroid as needed and patient will continue to work on her diet and exercise -We will follow-up with this on her next visit

## 2019-04-23 NOTE — Progress Notes (Signed)
   This AWV is being conducted in-person at [facility]  Subjective:   Mandy Holmes is a 68 y.o. female who presents for a Medicare Annual Wellness Visit.  The following items have been reviewed and updated today in the appropriate area in the EMR.   Health Risk Assessment  Height, weight, BMI, and BP Visual acuity if needed Depression screen Fall risk / safety level Advance directive discussion Medical and family history were reviewed and updated Updating list of other providers & suppliers Medication reconciliation, including over the counter medicines Cognitive screen Written screening schedule Risk Factor list Personalized health advice, risky behaviors, and treatment advice  Social History   Social History Narrative  . Not on file         Objective:    Vitals: BP 137/74 (BP Location: Right Arm, Patient Position: Sitting, Cuff Size: Small)   Pulse 81   Temp 98.2 F (36.8 C) (Oral)   Ht 5\' 4"  (1.626 m)   Wt 191 lb 3.2 oz (86.7 kg)   SpO2 98%   BMI 32.82 kg/m  Vitals are [facility] performed  Activities of Daily Living In your present state of health, do you have any difficulty performing the following activities: 04/23/2019 11/06/2018  Hearing? N N  Vision? N N  Difficulty concentrating or making decisions? N N  Walking or climbing stairs? Y Y  Comment - leg s  Sciatica   Dressing or bathing? N N  Doing errands, shopping? N N  Some recent data might be hidden    Goals Goals   None     Fall Risk Fall Risk  04/23/2019 11/06/2018 01/29/2018 09/15/2017 04/04/2017  Falls in the past year? 0 0 No Yes No  Number falls in past yr: 0 - - 2 or more -  Injury with Fall? - - - No -  Risk Factor Category  - - - High Fall Risk -  Risk for fall due to : - - - History of fall(s);Other (Comment) -  Risk for fall due to: Comment - - - fals from leaning from chair  -  Follow up - - - Falls prevention discussed -    Depression Screen PHQ 2/9 Scores 04/23/2019 11/06/2018 01/29/2018  09/15/2017  PHQ - 2 Score 0 0 0 6  PHQ- 9 Score 1 1 - 10     Cognitive Testing Patient cognition was assessed at bedside.  Patient works a full-time job and is oriented x3. She does not appear to have any issues with cognition currently.  Husband is present as well and does not report any cognitive issues with the patient.  Will obtain a Mini-Mental test on her next annual wellness visit    Assessment and Plan:    During the course of the visit the patient was educated and counseled about appropriate screening and preventive services as documented in the assessment and plan.  The printed AVS was given to the patient and included an updated screening schedule, a list of risk factors, and personalized health advice.        Aldine Contes, MD  04/23/2019

## 2019-04-23 NOTE — Assessment & Plan Note (Signed)
-  Patient has a history of mild asymptomatic hypercalcemia -We will recheck a calcium level on her follow-up visit and if elevated will consider pursuing further work-up

## 2019-04-23 NOTE — Assessment & Plan Note (Signed)
-  This problem is chronic and stable -Patient denies any worsening symptoms of hypothyroidism and states that she feels well -Her last TSH was within normal limits -We will recheck her TSH on her follow-up visit

## 2019-05-14 DIAGNOSIS — Z8542 Personal history of malignant neoplasm of other parts of uterus: Secondary | ICD-10-CM | POA: Diagnosis not present

## 2019-05-14 DIAGNOSIS — Z01419 Encounter for gynecological examination (general) (routine) without abnormal findings: Secondary | ICD-10-CM | POA: Diagnosis not present

## 2019-05-16 MED FILL — ESTRADIOL 0.1 MG/GM CREA: 0.1 | 90 days supply | Qty: 43 | Fill #0

## 2019-05-17 ENCOUNTER — Encounter: Payer: Self-pay | Admitting: Family Medicine

## 2019-05-17 ENCOUNTER — Ambulatory Visit (INDEPENDENT_AMBULATORY_CARE_PROVIDER_SITE_OTHER): Payer: 59 | Admitting: Family Medicine

## 2019-05-17 VITALS — BP 153/90 | HR 83 | Temp 98.8°F | Ht 65.5 in | Wt 194.0 lb

## 2019-05-17 DIAGNOSIS — Z0000 Encounter for general adult medical examination without abnormal findings: Secondary | ICD-10-CM | POA: Diagnosis not present

## 2019-05-17 DIAGNOSIS — E039 Hypothyroidism, unspecified: Secondary | ICD-10-CM

## 2019-05-17 DIAGNOSIS — E6609 Other obesity due to excess calories: Secondary | ICD-10-CM | POA: Diagnosis not present

## 2019-05-17 DIAGNOSIS — R6889 Other general symptoms and signs: Secondary | ICD-10-CM | POA: Diagnosis not present

## 2019-05-17 DIAGNOSIS — R739 Hyperglycemia, unspecified: Secondary | ICD-10-CM

## 2019-05-17 DIAGNOSIS — N898 Other specified noninflammatory disorders of vagina: Secondary | ICD-10-CM

## 2019-05-17 DIAGNOSIS — R5383 Other fatigue: Secondary | ICD-10-CM | POA: Diagnosis not present

## 2019-05-17 DIAGNOSIS — Z6831 Body mass index (BMI) 31.0-31.9, adult: Secondary | ICD-10-CM

## 2019-05-17 MED ORDER — BACLOFEN 10 MG PO TABS: 5.0000 mg | ORAL_TABLET | Freq: Every evening | ORAL | 3 refills | Status: DC | PRN

## 2019-05-17 MED FILL — BACLOFEN 10 MG TABS: 10 | 30 days supply | Qty: 30 | Fill #0

## 2019-05-17 NOTE — Progress Notes (Signed)
Office Visit Note   Patient: Mandy Holmes           Date of Birth: 1951/07/04           MRN: [id number] Visit Date: 05/17/2019 Requested by: Aldine Contes, MD [address] [place],  [place] [id number] PCP: Aldine Contes, MD  Subjective: Chief Complaint  Patient presents with  . establish care    HPI: She is here to establish care.  Her husband is also a patient.  She recently had a Medicare wellness exam, but has not yet had labs for that and she would like me to order those.  Her main complaint i thyroid related symptoms.  She has chronic fatigue, inability to lose weight, cold intolerance.  Her TSH was around 0.7 in January, but she still feels like this.  She in the past has taken "natural thyroid hormone" with better results, but was told that her pharmacy did not carry it anymore.  She also wonders whether vaginal estrogen cream is contributing to her symptoms.  It causes hot flashes.  She also has chronic low back and bilateral leg pain, right greater than left, related to hysterectomy surgery for uterine cancer.  She has had 2 epidural injections, 1 of them helped a lot and the other 1 did not help as much.  Blood pressure has been sporadically elevated, not enough to warrant treatment.                ROS: Denies fevers or chills.  All other systems were reviewed and are negative.  Objective: Vital Signs: BP (!) 153/90 (BP Location: Left Arm, Patient Position: Sitting, Cuff Size: Normal)   Pulse 83   Temp 98.8 F (37.1 C)   Ht 5' 5.5" (1.664 m)   Wt 194 lb (88 kg)   BMI 31.79 kg/m   Physical Exam:  General:  Alert and oriented, in no acute distress. Pulm:  Breathing unlabored. Psy:  Normal mood, congruent affect. Skin: No rash on the skin. HEENT:  [place]/AT, PERRLA, EOM Full, no nystagmus.  Funduscopic examination within normal limits.  No conjunctival erythema.  Tympanic membranes are pearly gray with normal landmarks.  External ear canals  are normal.  Nasal passages are clear.  Oropharynx is clear.  No significant lymphadenopathy.  No thyromegaly or nodules.  2+ carotid pulses without bruits. CV: Regular rate and rhythm without murmurs, rubs, or gallops.  Trace peripheral edema.  2+ radial and posterior tibial pulses. Lungs: Clear to auscultation throughout with no wheezing or areas of consolidation.    Imaging: None today  Assessment & Plan: 1.  Wellness examination -We will draw labs today.  2.  Hypothyroidism, still symptomatic. -We will draw labs including thyroid antibodies.  We may make some dietary changes if antibodies are positive.  Otherwise, we will switch to Nature-Throid and adjust dosage accordingly.  3.  Vaginal dryness -I will ask her gynecologist if she can try other options for this.     Procedures: No procedures performed  No notes on file     PMFS History: Patient Active Problem List   Diagnosis Date Noted  . Class 1 obesity due to excess calories without serious comorbidity with body mass index (BMI) of 31.0 to 31.9 in adult 01/29/2018  . Hypercalcemia 09/15/2017  . Elevated blood pressure reading 09/15/2017  . Dyspareunia, female 12/22/2015  . Preventative health care 05/19/2015  . History of estrogen deficiency 05/19/2015  . Chronic fatigue 05/19/2015  . Hypothyroidism  05/19/2015  . History of uterine cancer 11/07/2013   Past Medical History:  Diagnosis Date  . Cancer (HCC)    endometrial  . Constipation   . Headache(784.0)    migraines  . Hypothyroidism   . Migraine   . Sleep apnea    CPAP  . Venous insufficiency    bilateral lower legs    Family History  Problem Relation Age of Onset  . Uterine cancer Mother 37  . Stroke Mother   . Heart disease Brother   . Skin cancer Brother        had at younger age; unsure if melanoma  . Diabetes Father   . Skin cancer Father        unsure if melanoma  . Colon cancer Neg Hx   . Breast cancer Neg Hx     Past Surgical  History:  Procedure Laterality Date  . APPENDECTOMY    . KNEE ARTHROSCOPY Left   . ROBOTIC ASSISTED TOTAL HYSTERECTOMY WITH BILATERAL SALPINGO OOPHERECTOMY N/A 12/03/2013   Procedure: ROBOTIC ASSISTED TOTAL HYSTERECTOMY WITH BILATERAL SALPINGO OOPHORECTOMY WITH BILATERAL LYMPH NODE DISSECTION;  Surgeon: Imagene Gurney A. Alycia Rossetti, MD;  Location: [facility];  Service: Gynecology;  Laterality: N/A;  . TUBAL LIGATION     Social History   Occupational History  . Not on file  Tobacco Use  . Smoking status: Never Smoker  . Smokeless tobacco: Never Used  Substance and Sexual Activity  . Alcohol use: No    Alcohol/week: 0.0 standard drinks    Frequency: Never  . Drug use: No  . Sexual activity: Yes

## 2019-05-20 ENCOUNTER — Telehealth: Payer: Self-pay | Admitting: *Deleted

## 2019-05-20 ENCOUNTER — Telehealth: Payer: Self-pay | Admitting: Family Medicine

## 2019-05-20 LAB — CBC WITH DIFFERENTIAL/PLATELET
Absolute Monocytes: 527 cells/uL (ref 200–950)
Basophils Absolute: 68 cells/uL (ref 0–200)
Basophils Relative: 1.1 %
Eosinophils Absolute: 161 cells/uL (ref 15–500)
Eosinophils Relative: 2.6 %
HCT: 42.5 % (ref 35.0–45.0)
Hemoglobin: 14.4 g/dL (ref 11.7–15.5)
Lymphs Abs: 980 cells/uL (ref 850–3900)
MCH: 30.5 pg (ref 27.0–33.0)
MCHC: 33.9 g/dL (ref 32.0–36.0)
MCV: 90 fL (ref 80.0–100.0)
MPV: 9.7 fL (ref 7.5–12.5)
Monocytes Relative: 8.5 %
Neutro Abs: 4464 cells/uL (ref 1500–7800)
Neutrophils Relative %: 72 %
Platelets: 322 10*3/uL (ref 140–400)
RBC: 4.72 10*6/uL (ref 3.80–5.10)
RDW: 12.1 % (ref 11.0–15.0)
Total Lymphocyte: 15.8 %
WBC: 6.2 10*3/uL (ref 3.8–10.8)

## 2019-05-20 LAB — HEMOGLOBIN A1C
Hgb A1c MFr Bld: 5.8 % of total Hgb — ABNORMAL HIGH (ref <5.7)
Mean Plasma Glucose: 120 (calc)
eAG (mmol/L): 6.6 (calc)

## 2019-05-20 LAB — LIPID PANEL
Cholesterol: 256 mg/dL — ABNORMAL HIGH (ref <200)
HDL: 53 mg/dL (ref >=50)
LDL Cholesterol (Calc): 176 mg/dL (calc) — ABNORMAL HIGH
Non-HDL Cholesterol (Calc): 203 mg/dL (calc) — ABNORMAL HIGH (ref <130)
Total CHOL/HDL Ratio: 4.8 (calc) (ref <5.0)
Triglycerides: 133 mg/dL (ref <150)

## 2019-05-20 LAB — COMPREHENSIVE METABOLIC PANEL
AG Ratio: 2 (calc) (ref 1.0–2.5)
ALT: 33 U/L — ABNORMAL HIGH (ref 6–29)
AST: 22 U/L (ref 10–35)
Albumin: 4.7 g/dL (ref 3.6–5.1)
Alkaline phosphatase (APISO): 130 U/L (ref 37–153)
BUN: 21 mg/dL (ref 7–25)
CO2: 24 mmol/L (ref 20–32)
Calcium: 10.1 mg/dL (ref 8.6–10.4)
Chloride: 105 mmol/L (ref 98–110)
Creat: 0.53 mg/dL (ref 0.50–0.99)
Globulin: 2.4 g/dL (calc) (ref 1.9–3.7)
Glucose, Bld: 100 mg/dL — ABNORMAL HIGH (ref 65–99)
Potassium: 4.8 mmol/L (ref 3.5–5.3)
Sodium: 139 mmol/L (ref 135–146)
Total Bilirubin: 0.4 mg/dL (ref 0.2–1.2)
Total Protein: 7.1 g/dL (ref 6.1–8.1)

## 2019-05-20 LAB — HIGH SENSITIVITY CRP: hs-CRP: 1.1 mg/L

## 2019-05-20 LAB — THYROID PANEL WITH TSH
Free Thyroxine Index: 2.9 (ref 1.4–3.8)
T3 Uptake: 31 % (ref 22–35)
T4, Total: 9.4 ug/dL (ref 5.1–11.9)
TSH: 0.6 mIU/L (ref 0.40–4.50)

## 2019-05-20 LAB — THYROID PEROXIDASE ANTIBODY: Thyroperoxidase Ab SerPl-aCnc: 1 IU/mL (ref <9)

## 2019-05-20 NOTE — Telephone Encounter (Signed)
Labs show:  Blood glucose and hemoglobin A1C are in prediabetes range.  This could explain chronic fatigue.  It is important to exercise regularly and to minimize intake of processed carbs including breads; pastas; cereals; sugars/sweets.  Recheck in about 6 months.  Lipids will improve with lifestyle changes above.  Thyroid normal so far.  Antibodies still pending.  Others look normal.

## 2019-05-20 NOTE — Telephone Encounter (Signed)
-----   Message from Aldine Contes, MD sent at 05/17/2019  9:53 AM EDT ----- Regarding: is she changing PCPs Hello, This patient is now following with ortho care who is ordering thyroid function and doing wellness exams. I had explained to her on a prior visit that on the AWV we don't usually order labs and will do it on the follow up exam but she appears to be changing providers. Please clarify with patient if she still wants to be a patient of [facility] or if she is switching clinics.  Thank you, Dr. Dareen Piano ----- Message ----- From: Eunice Blase, MD Sent: 05/17/2019   8:58 AM EDT To: Aldine Contes, MD

## 2019-05-20 NOTE — Telephone Encounter (Signed)
Attempted to call pt, no answers or vmail at mobile and home

## 2019-05-20 NOTE — Telephone Encounter (Signed)
Final result was negative/normal.

## 2019-05-23 ENCOUNTER — Encounter: Payer: Self-pay | Admitting: Family Medicine

## 2019-05-23 ENCOUNTER — Other Ambulatory Visit: Payer: Self-pay | Admitting: Family Medicine

## 2019-05-23 ENCOUNTER — Telehealth: Payer: Self-pay

## 2019-05-23 MED ORDER — THYROID 48.75 MG PO TABS: 1.0000 | ORAL_TABLET | Freq: Every day | ORAL | 6 refills | Status: DC

## 2019-05-23 MED FILL — NATURE-THROID 48.75 MG TAB: 48.75 | 30 days supply | Qty: 30 | Fill #0

## 2019-05-23 NOTE — Telephone Encounter (Signed)
Message sent

## 2019-05-23 NOTE — Telephone Encounter (Signed)
The patient's husband came in today for bloodwork. His wife sent him with some questions about medications: #1 - should she change to Armour thyroid, #2 - should she discontinue the estradiol cream?   Please send the patient a message in MyChart regarding these questions.

## 2019-06-11 MED FILL — BACLOFEN 10 MG TABS: 10 | 30 days supply | Qty: 30 | Fill #1

## 2019-06-18 ENCOUNTER — Other Ambulatory Visit: Payer: Self-pay | Admitting: Family Medicine

## 2019-06-18 DIAGNOSIS — Z1231 Encounter for screening mammogram for malignant neoplasm of breast: Secondary | ICD-10-CM

## 2019-06-19 ENCOUNTER — Encounter: Payer: Self-pay | Admitting: Family Medicine

## 2019-06-19 MED ORDER — THYROID 48.75 MG PO TABS: 1.0000 | ORAL_TABLET | Freq: Every day | ORAL | 6 refills | Status: DC

## 2019-06-19 MED FILL — NATURE-THROID 48.75 MG TAB: 48.75 | 30 days supply | Qty: 30 | Fill #1

## 2019-06-20 MED ORDER — THYROID 97.5 MG PO TABS: 48.7500 mg | ORAL_TABLET | Freq: Every day | ORAL | 3 refills | Status: DC

## 2019-06-20 NOTE — Addendum Note (Signed)
Addended by: Hortencia Pilar on: 06/20/2019 12:17 PM   Modules accepted: Orders

## 2019-06-25 ENCOUNTER — Other Ambulatory Visit: Payer: Self-pay | Admitting: Family Medicine

## 2019-06-25 MED ORDER — THYROID 90 MG PO TABS: 45.0000 mg | ORAL_TABLET | Freq: Every day | ORAL | 3 refills | Status: DC

## 2019-07-08 ENCOUNTER — Encounter: Payer: Self-pay | Admitting: Family Medicine

## 2019-07-08 MED ORDER — THYROID 90 MG PO TABS: 45.0000 mg | ORAL_TABLET | Freq: Every day | ORAL | 3 refills | Status: DC

## 2019-07-15 MED FILL — BACLOFEN 10 MG TABS: 10 | 30 days supply | Qty: 30 | Fill #2

## 2019-08-01 ENCOUNTER — Other Ambulatory Visit: Payer: Self-pay

## 2019-08-01 ENCOUNTER — Ambulatory Visit
Admission: RE | Admit: 2019-08-01 | Discharge: 2019-08-01 | Disposition: A | Discharge status: Home / Self Care | Payer: 59 | Source: Ambulatory Visit | Attending: Family Medicine | Admitting: Family Medicine

## 2019-08-01 DIAGNOSIS — Z1231 Encounter for screening mammogram for malignant neoplasm of breast: Secondary | ICD-10-CM

## 2019-08-05 ENCOUNTER — Other Ambulatory Visit: Payer: Self-pay | Admitting: Family Medicine

## 2019-08-05 DIAGNOSIS — R928 Other abnormal and inconclusive findings on diagnostic imaging of breast: Secondary | ICD-10-CM

## 2019-08-14 MED FILL — BACLOFEN 10 MG TABS: 10 | 30 days supply | Qty: 30 | Fill #3

## 2019-08-15 ENCOUNTER — Other Ambulatory Visit: Payer: Self-pay | Admitting: Family Medicine

## 2019-08-15 ENCOUNTER — Telehealth: Payer: Self-pay | Admitting: Family Medicine

## 2019-08-15 ENCOUNTER — Ambulatory Visit
Admission: RE | Admit: 2019-08-15 | Discharge: 2019-08-15 | Disposition: A | Discharge status: Home / Self Care | Payer: 59 | Source: Ambulatory Visit | Attending: Family Medicine | Admitting: Family Medicine

## 2019-08-15 ENCOUNTER — Other Ambulatory Visit: Payer: Self-pay

## 2019-08-15 DIAGNOSIS — R922 Inconclusive mammogram: Secondary | ICD-10-CM | POA: Diagnosis not present

## 2019-08-15 DIAGNOSIS — R928 Other abnormal and inconclusive findings on diagnostic imaging of breast: Secondary | ICD-10-CM

## 2019-08-15 NOTE — Progress Notes (Signed)
Consult Note: Gyn-Onc  Mandy Holmes 68 y.o. female  CC:  Chief Complaint  Patient presents with  . History of uterine cancer    HPI: Mandy Holmes is a 68 year old female who had been on the Vivelle-Dot and progesterone for hormonal therapy since 2001. She reported vaginal bleeding in 01/2012. Evaluation at that time was notable for benign weakly proliferative endometrium with no atypia or hyperplasia. She did well until 9 months ago when she noted irregular bleeding. She presented for evaluation and an endometrial biopsy 10/22/2013 was notable for endometrial adenocarcinoma FIGO grade 1 and a benign endocervical polyp. The uterus sounded to 7 cm.  Her family history is notable for mother diagnosed with uterine cancer at age 82 and a brother with melanoma.   On 12/03/13, she underwent a total robotic hysterectomy, bilateral salpingo-oophorectomy, bilateral pelvic lymph node dissection.  Her post-operative course was uneventful.  Final pathology revealed: 1. 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 - CERVIX: BENIGN SQUAMOUS MUCOSA AND ENDOCERVICAL MUCOSA, NO DYSPLASIA OR MALIGNANCY. - BILATERAL OVARIES AND FALLOPIAN TUBES: BENIGN OVARIAN TISSUE AND FALLOPIAN TUBAL TISSUE, NO EVIDENCE OF MALIGNANCY. 2. Lymph nodes, regional resection, right pelvic - SIX LYMPH NODES, NEGATIVE FOR METASTATIC CARCINOMA (0/6). 3. Lymph nodes, regional resection, left pelvic - THREE LYMPH NODES, NEGATIVE FOR METASTATIC CARCINOMA (0/3).   Interval History:  She was seen by Dr. Gerarda Fraction in 10/19 and comes in today for follow up.  She is overall doing well.  She sees Dr. Vanessa Kick for her routine GYN.  She has 2 small areas on the vulva that she would like for me to look at.  She states that she believes they have been there a long time.  She states about a year ago she felt a pop in her abdomen towards the left side  and thinks that she might have a hernia.  She states she is been seen by a couple providers and nothing is really been noted.  The only time it really bothers her if she is standing doing dishes and she presses against her abdomen.  She does not think it is gotten any bigger.  She has not had no associated nausea vomiting or other abdominal pain with it.  She denies any vaginal bleeding.  She is up-to-date on her mammograms having had one last week.  She is up-to-date on her colonoscopy but does not exactly remember when her next one needs to be.  There are no new medical problems in her family  Review of Systems Constitutional:  Denies fever.  Denies unintentional weight loss or weight gain Skin: No rash Cardiovascular: No chest pain, shortness of breath, + edema secondary to leaky valves in her legs and varicose veins  Pulmonary: No cough Gastro Intestinal:  No nausea, vomiting, constipation, or diarrhea reported.  Genitourinary: Denies vaginal bleeding and discharge.  Musculoskeletal: No joint pain.  Psychology: No complaints  Current Meds:  Outpatient Encounter Medications as of 08/20/2019  Medication Sig  . Ascorbic Acid (VITAMIN C) 1000 MG tablet Take 3,000 mg by mouth daily.  Marland Kitchen b complex vitamins tablet Take 1 tablet by mouth daily.  . baclofen (LIORESAL) 10 MG tablet Take 0.5-1 tablets (5-10 mg total) by mouth at bedtime as needed for muscle spasms.  . Cholecalciferol (VITAMIN D3) 125 MCG (5000 UT) TABS Take by mouth.  . Cyanocobalamin (VITAMIN B-12 PO) Take 5,000 mcg by mouth daily.  Marland Kitchen  ESTRACE VAGINAL 0.1 MG/GM vaginal cream 2 (two) times a week. Patient states she uses this medication on Wednesday and Sunday.  Marland Kitchen ibuprofen (ADVIL) 200 MG tablet Take 200 mg by mouth every 6 (six) hours as needed.  . L-LYSINE PO Take 1,000 mg by mouth daily.   Marland Kitchen MAGNESIUM GLYCINATE PLUS PO Take 400 mg by mouth daily.   . Menaquinone-7 (VITAMIN K2 PO) Take by mouth.  . Omega-3 Fatty Acids (FISH OIL PO)  Take 1,400 mg by mouth daily.   Marland Kitchen OVER THE COUNTER MEDICATION BC POWDERS FOR MIGRAINE  . Probiotic Product (PROBIOTIC DAILY PO) Take 1 capsule by mouth 2 (two) times daily.  Marland Kitchen thyroid (NP THYROID) 90 MG tablet Take 0.5 tablets (45 mg total) by mouth daily.   No facility-administered encounter medications on file as of 08/20/2019.     Allergy: No Known Allergies  Social Hx:   Social History   Socioeconomic History  . Marital status: Married    Spouse name: Not on file  . Number of children: Not on file  . Years of education: Not on file  . Highest education level: Not on file  Occupational History  . Not on file  Social Needs  . Financial resource strain: Not on file  . Food insecurity    Worry: Not on file    Inability: Not on file  . Transportation needs    Medical: Not on file    Non-medical: Not on file  Tobacco Use  . Smoking status: Never Smoker  . Smokeless tobacco: Never Used  Substance and Sexual Activity  . Alcohol use: No    Alcohol/week: 0.0 standard drinks    Frequency: Never  . Drug use: No  . Sexual activity: Yes  Lifestyle  . Physical activity    Days per week: Not on file    Minutes per session: Not on file  . Stress: Not on file  Relationships  . Social Herbalist on phone: Not on file    Gets together: Not on file    Attends religious service: Not on file    Active member of club or organization: Not on file    Attends meetings of clubs or organizations: Not on file    Relationship status: Not on file  . Intimate partner violence    Fear of current or ex partner: Not on file    Emotionally abused: Not on file    Physically abused: Not on file    Forced sexual activity: Not on file  Other Topics Concern  . Not on file  Social History Narrative  . Not on file    Past Surgical Hx:  Past Surgical History:  Procedure Laterality Date  . APPENDECTOMY    . KNEE ARTHROSCOPY Left   . ROBOTIC ASSISTED TOTAL HYSTERECTOMY WITH  BILATERAL SALPINGO OOPHERECTOMY N/A 12/03/2013   Procedure: ROBOTIC ASSISTED TOTAL HYSTERECTOMY WITH BILATERAL SALPINGO OOPHORECTOMY WITH BILATERAL LYMPH NODE DISSECTION;  Surgeon: Imagene Gurney A. Alycia Rossetti, MD;  Location: [facility];  Service: Gynecology;  Laterality: N/A;  . TUBAL LIGATION      Past Medical Hx:  Past Medical History:  Diagnosis Date  . Cancer (HCC)    endometrial  . Constipation   . Headache(784.0)    migraines  . Hypothyroidism   . Migraine   . Sleep apnea    CPAP  . Venous insufficiency    bilateral lower legs    Oncology Hx:  Oncology History  History of uterine  cancer  11/07/2013 Initial Diagnosis   Uterine cancer   12/03/2013 Surgery   surgery for endometrial cancer with TRH, BSO bilateral pelvic LND. Pathology consistent with IA1 endometrioid adenocarcinoma.     Family Hx:  Family History  Problem Relation Age of Onset  . Uterine cancer Mother 46  . Stroke Mother   . Heart disease Brother   . Skin cancer Brother        had at younger age; unsure if melanoma  . Diabetes Father   . Skin cancer Father        unsure if melanoma  . Colon cancer Neg Hx   . Breast cancer Neg Hx     Vitals:  Blood pressure (!) 147/80, pulse 74, temperature 98.5 F (36.9 C), temperature source Temporal, resp. rate 18, height 5' 4.25" (1.632 m), weight 187 lb (84.8 kg), SpO2 98 %.  Physical Exam: Well-nourished well-developed female in no acute distress.  Neck: Supple, no lymphadenopathy, no thyromegaly.  Abdomen: Well-healed surgical incisions that are barely discernible.  She does have a reducible hernia that is to the left side of the umbilicus.  The opening the fascia measured approximately 3 cm.  She was able to appreciate that on exam today and was shown where the hernia is.  Groins: No lymphadenopathy.  Extremities: 1+ nonpitting edema equal bilaterally.  Significant varicose veins.  Pelvic: External genitalia notable for 2 small little hyperemic red areas consistent  with either small hemangiomas or small varicosities in the labia.  The remainder of the vulva is without lesions.  The vagina is markedly atrophic.  The vaginal cuff is visualized.  There are no visible lesions.  Bimanual examination reveals no masses or nodularity.  Rectal confirms  Assessment/Plan: 68 year old with Stage IA endometrioid carcinoma s/p robotic hysterectomy, BSO, and node dissection on 12/03/13.  Final pathology was consistent with a stage IA grade 1 endometrioid adenocarcinoma with negative nodes and she requires no additional treatment.    She is past her 5-year anniversary and will be released from our clinic. She knows that we will be happy to see her in the future should the need arise. She will follow up with Dr. Vanessa Kick  She is up to date on her colonoscopy and is due in 2023.  We reviewed the signs and symptoms of hernias and indications for urgent referral and care.  She was appreciative that we were able to appreciate the hernia today as she thought that there was something going on.  At this point is not particular bothersome to her and she does not wish to proceed with any intervention.  I discussed with her that at the size I think that she would be a candidate for laparoscopic hernia repair should she wish.  At this point she does not want to do that and will continue to be followed expectantly.  We have appreciated the opportunity to part in the care of this very pleasant patient.  Treshaun Carrico A., MD 08/20/2019, 9:45 AM

## 2019-08-15 NOTE — Telephone Encounter (Signed)
Mammogram was negative/normal.   

## 2019-08-20 ENCOUNTER — Other Ambulatory Visit: Payer: Self-pay

## 2019-08-20 ENCOUNTER — Inpatient Hospital Stay: Payer: 59 | Attending: Gynecologic Oncology | Admitting: Gynecologic Oncology

## 2019-08-20 ENCOUNTER — Encounter: Payer: Self-pay | Admitting: Gynecologic Oncology

## 2019-08-20 VITALS — BP 147/80 | HR 74 | Temp 98.5°F | Resp 18 | Ht 64.25 in | Wt 187.0 lb

## 2019-08-20 DIAGNOSIS — E039 Hypothyroidism, unspecified: Secondary | ICD-10-CM | POA: Insufficient documentation

## 2019-08-20 DIAGNOSIS — Z90710 Acquired absence of both cervix and uterus: Secondary | ICD-10-CM | POA: Diagnosis not present

## 2019-08-20 DIAGNOSIS — Z79899 Other long term (current) drug therapy: Secondary | ICD-10-CM | POA: Diagnosis not present

## 2019-08-20 DIAGNOSIS — Z90722 Acquired absence of ovaries, bilateral: Secondary | ICD-10-CM | POA: Diagnosis not present

## 2019-08-20 DIAGNOSIS — Z08 Encounter for follow-up examination after completed treatment for malignant neoplasm: Secondary | ICD-10-CM | POA: Diagnosis not present

## 2019-08-20 DIAGNOSIS — I872 Venous insufficiency (chronic) (peripheral): Secondary | ICD-10-CM | POA: Insufficient documentation

## 2019-08-20 DIAGNOSIS — Z8542 Personal history of malignant neoplasm of other parts of uterus: Secondary | ICD-10-CM | POA: Insufficient documentation

## 2019-08-20 NOTE — Patient Instructions (Signed)
Congrats on graduating!! Plan to follow up with Dr. Vanessa Kick for your annual well woman visit.     Hernia, Adult     A hernia happens when tissue inside your body pushes out through a weak spot in your belly muscles (abdominal wall). This makes a round lump (bulge). The lump may be:  In a scar from surgery that was done in your belly (incisional hernia).  Near your belly button (umbilical hernia).  In your groin (inguinal hernia). Your groin is the area where your leg meets your lower belly (abdomen). This kind of hernia could also be: ? In your scrotum, if you are female. ? In folds of skin around your vagina, if you are female.  In your upper thigh (femoral hernia).  Inside your belly (hiatal hernia). This happens when your stomach slides above the muscle between your belly and your chest (diaphragm). If your hernia is small and it does not cause pain, you may not need treatment. If your hernia is large or it causes pain, you may need surgery. Follow these instructions at home: Activity  Avoid stretching or overusing (straining) the muscles near your hernia. Straining can happen when you: ? Lift something heavy. ? Poop (have a bowel movement).  Do not lift anything that is heavier than 10 lb (4.5 kg), or the limit that you are told, until your doctor says that it is safe.  Use the strength of your legs when you lift something heavy. Do not use only your back muscles to lift. General instructions  Do these things if told by your doctor so you do not have trouble pooping (constipation): ? Drink enough fluid to keep your pee (urine) pale yellow. ? Eat foods that are high in fiber. These include fresh fruits and vegetables, whole grains, and beans. ? Limit foods that are high in fat and processed sugars. These include foods that are fried or sweet. ? Take medicine for trouble pooping.  When you cough, try to cough gently.  You may try to push your hernia in by very gently  pressing on it when you are lying down. Do not try to force the bulge back in if it will not push in easily.  If you are overweight, work with your doctor to lose weight safely.  Do not use any products that have nicotine or tobacco in them. These include cigarettes and e-cigarettes. If you need help quitting, ask your doctor.  If you will be having surgery (hernia repair), watch your hernia for changes in shape, size, or color. Tell your doctor if you see any changes.  Take over-the-counter and prescription medicines only as told by your doctor.  Keep all follow-up visits as told by your doctor. Contact a doctor if:  You get new pain, swelling, or redness near your hernia.  You poop fewer times in a week than normal.  You have trouble pooping.  You have poop (stool) that is more dry than normal.  You have poop that is harder or larger than normal. Get help right away if:  You have a fever.  You have belly pain that gets worse.  You feel sick to your stomach (nauseous).  You throw up (vomit).  Your hernia cannot be pushed in by very gently pressing on it when you are lying down. Do not try to force the bulge back in if it will not push in easily.  Your hernia: ? Changes in shape or size. ? Changes color. ? Feels  hard or it hurts when you touch it. These symptoms may represent a serious problem that is an emergency. Do not wait to see if the symptoms will go away. Get medical help right away. Call your local emergency services (911 in the [place] Summary  A hernia happens when tissue inside your body pushes out through a weak spot in the belly muscles. This creates a bulge.  If your hernia is small and it does not hurt, you may not need treatment. If your hernia is large or it hurts, you may need surgery.  If you will be having surgery, watch your hernia for changes in shape, size, or color. Tell your doctor about any changes. This information is not intended to replace  advice given to you by your health care provider. Make sure you discuss any questions you have with your health care provider. Document Released: 03/16/2010 Document Revised: 01/17/2019 Document Reviewed: 06/28/2017 Elsevier Patient Education  2020 [employer].

## 2019-08-30 MED FILL — ESTRADIOL 0.1 MG/GM CREA: 0.1 | 90 days supply | Qty: 43 | Fill #1

## 2019-09-13 DIAGNOSIS — G4733 Obstructive sleep apnea (adult) (pediatric): Secondary | ICD-10-CM | POA: Diagnosis not present

## 2019-09-16 ENCOUNTER — Other Ambulatory Visit: Payer: Self-pay | Admitting: Family Medicine

## 2019-09-16 MED FILL — BACLOFEN 10 MG TABS: 10 | 30 days supply | Qty: 30 | Fill #0

## 2019-09-25 DIAGNOSIS — L292 Pruritus vulvae: Secondary | ICD-10-CM | POA: Diagnosis not present

## 2019-09-25 DIAGNOSIS — R32 Unspecified urinary incontinence: Secondary | ICD-10-CM | POA: Diagnosis not present

## 2019-09-25 MED FILL — CLOBETASOL PROPIONATE 0.05: 0.05 | 30 days supply | Qty: 60 | Fill #0

## 2019-10-15 MED FILL — BACLOFEN 10 MG TABS: 10 | 30 days supply | Qty: 30 | Fill #1

## 2019-11-18 MED FILL — BACLOFEN 10 MG TABS: 10 | 30 days supply | Qty: 30 | Fill #2

## 2019-12-12 MED FILL — ESTRADIOL 0.1 MG/GM CREA: 0.1 | 90 days supply | Qty: 43 | Fill #2

## 2019-12-18 MED FILL — BACLOFEN 10 MG TABS: 10 | 30 days supply | Qty: 30 | Fill #3

## 2020-01-13 ENCOUNTER — Encounter: Payer: Self-pay | Admitting: Family Medicine

## 2020-01-13 ENCOUNTER — Other Ambulatory Visit: Payer: Self-pay | Admitting: Family Medicine

## 2020-01-13 MED ORDER — BACLOFEN 10 MG PO TABS: 5.0000 mg | ORAL_TABLET | Freq: Two times a day (BID) | ORAL | 3 refills | Status: DC | PRN

## 2020-01-13 MED FILL — BACLOFEN 10 MG TABS: 10 | 15 days supply | Qty: 30 | Fill #0

## 2020-02-04 ENCOUNTER — Other Ambulatory Visit: Payer: Self-pay | Admitting: Family Medicine

## 2020-02-04 ENCOUNTER — Encounter: Payer: Self-pay | Admitting: Family Medicine

## 2020-02-04 MED ORDER — BACLOFEN 10 MG PO TABS: 5.0000 mg | ORAL_TABLET | Freq: Two times a day (BID) | ORAL | 6 refills | Status: DC | PRN

## 2020-02-04 MED FILL — BACLOFEN 10 MG TABS: 10 | 30 days supply | Qty: 60 | Fill #0

## 2020-02-07 MED FILL — CLOBETASOL PROPIONATE 0.05: 0.05 | 30 days supply | Qty: 60 | Fill #1

## 2020-03-05 MED FILL — ESTRADIOL 0.1 MG/GM CREA: 0.1 | 90 days supply | Qty: 43 | Fill #3

## 2020-03-10 MED FILL — BACLOFEN 10 MG TABS: 10 | 30 days supply | Qty: 60 | Fill #1

## 2020-03-16 ENCOUNTER — Encounter: Payer: Self-pay | Admitting: Family Medicine

## 2020-03-16 DIAGNOSIS — L989 Disorder of the skin and subcutaneous tissue, unspecified: Secondary | ICD-10-CM

## 2020-03-23 ENCOUNTER — Telehealth: Payer: Self-pay | Admitting: Physician Assistant

## 2020-03-23 NOTE — Telephone Encounter (Signed)
Referral.  Appt set for 07/07/20 @9 :00 w/KRS.

## 2020-04-17 MED FILL — BACLOFEN 10 MG TABS: 10 | 30 days supply | Qty: 60 | Fill #2

## 2020-04-29 DIAGNOSIS — G4733 Obstructive sleep apnea (adult) (pediatric): Secondary | ICD-10-CM | POA: Diagnosis not present

## 2020-05-21 MED FILL — BACLOFEN 10 MG TABS: 10 | 30 days supply | Qty: 60 | Fill #3

## 2020-06-01 ENCOUNTER — Other Ambulatory Visit (HOSPITAL_COMMUNITY): Payer: Self-pay | Admitting: Obstetrics and Gynecology

## 2020-06-01 DIAGNOSIS — N952 Postmenopausal atrophic vaginitis: Secondary | ICD-10-CM | POA: Diagnosis not present

## 2020-06-01 DIAGNOSIS — Z01419 Encounter for gynecological examination (general) (routine) without abnormal findings: Secondary | ICD-10-CM | POA: Diagnosis not present

## 2020-06-01 DIAGNOSIS — Z8542 Personal history of malignant neoplasm of other parts of uterus: Secondary | ICD-10-CM | POA: Diagnosis not present

## 2020-06-01 MED FILL — ESTRADIOL 0.1 MG/GM CREA: 0.1 | 90 days supply | Qty: 43 | Fill #0

## 2020-06-05 ENCOUNTER — Encounter (INDEPENDENT_AMBULATORY_CARE_PROVIDER_SITE_OTHER): Payer: Self-pay

## 2020-06-05 NOTE — Progress Notes (Unsigned)
Faxed Rx request for refill for C-pap supplis from [place]

## 2020-06-20 DIAGNOSIS — Z23 Encounter for immunization: Secondary | ICD-10-CM | POA: Diagnosis not present

## 2020-06-22 MED FILL — BACLOFEN 10 MG TABS: 10 | 30 days supply | Qty: 60 | Fill #4

## 2020-07-03 ENCOUNTER — Other Ambulatory Visit: Payer: Self-pay | Admitting: Family Medicine

## 2020-07-03 DIAGNOSIS — Z1231 Encounter for screening mammogram for malignant neoplasm of breast: Secondary | ICD-10-CM

## 2020-07-07 ENCOUNTER — Encounter: Payer: Self-pay | Admitting: Physician Assistant

## 2020-07-07 ENCOUNTER — Other Ambulatory Visit: Payer: Self-pay

## 2020-07-07 ENCOUNTER — Ambulatory Visit (INDEPENDENT_AMBULATORY_CARE_PROVIDER_SITE_OTHER): Payer: 59 | Admitting: Physician Assistant

## 2020-07-07 DIAGNOSIS — Z1283 Encounter for screening for malignant neoplasm of skin: Secondary | ICD-10-CM

## 2020-07-07 DIAGNOSIS — L820 Inflamed seborrheic keratosis: Secondary | ICD-10-CM

## 2020-07-07 DIAGNOSIS — C44321 Squamous cell carcinoma of skin of nose: Secondary | ICD-10-CM | POA: Diagnosis not present

## 2020-07-07 DIAGNOSIS — C44311 Basal cell carcinoma of skin of nose: Secondary | ICD-10-CM

## 2020-07-07 DIAGNOSIS — C4441 Basal cell carcinoma of skin of scalp and neck: Secondary | ICD-10-CM

## 2020-07-07 DIAGNOSIS — L570 Actinic keratosis: Secondary | ICD-10-CM | POA: Diagnosis not present

## 2020-07-07 DIAGNOSIS — C44219 Basal cell carcinoma of skin of left ear and external auricular canal: Secondary | ICD-10-CM

## 2020-07-07 DIAGNOSIS — C4491 Basal cell carcinoma of skin, unspecified: Secondary | ICD-10-CM

## 2020-07-07 DIAGNOSIS — C4499 Other specified malignant neoplasm of skin, unspecified: Secondary | ICD-10-CM

## 2020-07-07 HISTORY — DX: Basal cell carcinoma of skin, unspecified: C44.91

## 2020-07-07 MED ORDER — TOLAK 4 % EX CREA: 1.0000 "application " | TOPICAL_CREAM | Freq: Every day | CUTANEOUS | 1 refills | Status: DC

## 2020-07-07 NOTE — Patient Instructions (Signed)

## 2020-07-08 ENCOUNTER — Telehealth: Payer: Self-pay | Admitting: *Deleted

## 2020-07-08 MED ORDER — TOLAK 4 % EX CREA: 1.0000 "application " | TOPICAL_CREAM | Freq: Every day | CUTANEOUS | 1 refills | Status: DC

## 2020-07-08 NOTE — Progress Notes (Addendum)
New Patient   Subjective  Mandy Holmes is a 69 y.o. female who presents for the following: Annual Exam (UNDER RIGHT BREAST LESION ISK? PORES ON FACE AND NOSE LARGE, ISK ON HER BACK).   The following portions of the chart were reviewed this encounter and updated as appropriate: Tobacco  Allergies  Meds  Problems  Med Hx  Surg Hx  Fam Hx      Objective  Well appearing patient in no apparent distress; mood and affect are within normal limits.  A full examination was performed including scalp, head, eyes, ears, nose, lips, neck, chest, axillae, abdomen, back, buttocks, bilateral upper extremities, bilateral lower extremities, hands, feet, fingers, toes, fingernails, and toenails. All findings within normal limits unless otherwise noted below.  Objective  Chest - Medial (Center) (6), Left Hand - Posterior (2), Left Lower Back, Right Hand - Posterior (3): Erythematous patches with gritty scale.  Objective  Right Abdomen (side) - Upper: Erythematous stuck-on, waxy papule or plaque.   Objective  Head to toe: No atypical nevi   Objective  Right Tip of Nose: Pearly papule with telangectasia.  5-0 nylon x 1     Objective  Left Postauricular Sulcus: Pearly papule with telangectasia.  5-0 nylon x 1     Assessment & Plan  AK (actinic keratosis) (12) Left Hand - Posterior (2); Right Hand - Posterior (3); Chest - Medial (Center) (6); Left Lower Back  Destruction of lesion - Chest - Medial (Center), Left Hand - Posterior, Left Lower Back, Right Hand - Posterior Complexity: simple   Destruction method: cryotherapy   Informed consent: discussed and consent obtained   Timeout:  patient name, date of birth, surgical site, and procedure verified Lesion destroyed using liquid nitrogen: Yes   Cryotherapy cycles:  1 Outcome: patient tolerated procedure well with no complications   Post-procedure details: wound care instructions given    Seborrheic keratoses,  inflamed Right Abdomen (side) - Upper  Destruction of lesion - Right Abdomen (side) - Upper Complexity: simple   Destruction method: cryotherapy   Informed consent: discussed and consent obtained   Timeout:  patient name, date of birth, surgical site, and procedure verified Lesion destroyed using liquid nitrogen: Yes   Cryotherapy cycles:  1 Outcome: patient tolerated procedure well with no complications   Post-procedure details: wound care instructions given    Screening exam for skin cancer Head to toe  Yearly skin exams  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  Skin / nail biopsy Type of biopsy: punch   Informed consent: discussed and consent obtained   Procedure prep:  Patient was prepped and draped in usual sterile fashion (nonsterile) Prep type:  Chlorhexidine Anesthesia: the lesion was anesthetized in a standard fashion   Anesthetic:  1% lidocaine w/ epinephrine 1-100,000 local infiltration Punch size:  4 mm Suture size:  5-0 Suture type: nylon   Suture removal (days):  11 Outcome: patient tolerated procedure well   Post-procedure details: wound care instructions given   Post-procedure details comment:  Nonsterile  Specimen 1 - Surgical pathology Differential Diagnosis: bcc Check Margins: yes  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  Skin / nail biopsy Type of biopsy: punch   Informed consent: discussed and consent obtained   Procedure prep:  Patient was prepped and draped in usual sterile fashion (nonsterile) Prep type:  Chlorhexidine Anesthesia: the lesion was anesthetized in a standard fashion   Anesthetic:  1% lidocaine w/ epinephrine 1-100,000  local infiltration Punch size:  4 mm Suture size:  5-0 Suture type: nylon   Suture removal (days):  11 Outcome: patient tolerated procedure well   Post-procedure details: wound care instructions given   Post-procedure details comment:  Nonsterile  Specimen 2 -  Surgical pathology Differential Diagnosis: bcc vs scc Check Margins: yes Alfonzo Feller if +     I, Ethal Gotay, PA-C, have reviewed all documentation for this visit. The documentation on 08/31/20 for the exam, diagnosis, procedures, and orders are all accurate and complete.

## 2020-07-08 NOTE — Telephone Encounter (Signed)
[facility] cant get [place] Cream so I sent prescription to [facility].

## 2020-07-15 ENCOUNTER — Ambulatory Visit (INDEPENDENT_AMBULATORY_CARE_PROVIDER_SITE_OTHER): Payer: 59 | Admitting: *Deleted

## 2020-07-15 ENCOUNTER — Other Ambulatory Visit: Payer: Self-pay

## 2020-07-15 DIAGNOSIS — Z4802 Encounter for removal of sutures: Secondary | ICD-10-CM

## 2020-07-15 NOTE — Progress Notes (Signed)
Here for suture removal for nose and left side ear. Removed x 1 suture.The suture on nose fell out on its own. I gave pathology report to patient but Mandy Holmes has not reviewed it. I will send to Dr. Denna Haggard and told her we will call to schedule procedure.

## 2020-07-17 ENCOUNTER — Telehealth: Payer: Self-pay | Admitting: *Deleted

## 2020-07-17 NOTE — Telephone Encounter (Signed)
Phone call to patient to let her know Dr.Tafeen feels she needs [facility] surgery for her 2 skin cancers. Told her I will schedule her and they will call with appointment time and date.

## 2020-07-17 NOTE — Telephone Encounter (Signed)
-----   Message from Lavonna Monarch, MD sent at 07/16/2020  7:11 AM EDT ----- 2 biopsies showed skin cancer and Dr. Darene Lamer recommends both be treated with Mohs surgery. ----- Message ----- From: Ailene Rud, CMA Sent: 07/15/2020   2:29 PM EDT To: Lavonna Monarch, MD  Dr. Denna Haggard this is kellis patient can you review path and see if she needs mohs or you to treat it.

## 2020-07-20 ENCOUNTER — Telehealth: Payer: Self-pay | Admitting: Dermatology

## 2020-07-20 NOTE — Telephone Encounter (Signed)
Patient left message on office voice mail that the Tobe Sos is causing her to have severe hotflashes and the red bumps make her feel like she is on fire.  Patient did skip one day of use and then restarted so she could sleep that night.  Patient wants to know if she really needs to continue or can she stop for a while.

## 2020-07-22 MED FILL — BACLOFEN 10 MG TABS: 10 | 30 days supply | Qty: 60 | Fill #5

## 2020-08-05 ENCOUNTER — Ambulatory Visit: Payer: 59

## 2020-08-10 ENCOUNTER — Telehealth: Payer: Self-pay

## 2020-08-10 NOTE — Telephone Encounter (Signed)
-----   Message from Lavonna Monarch, MD sent at 08/10/2020  8:31 AM EDT ----- Schedule Mohs

## 2020-08-10 NOTE — Telephone Encounter (Signed)
Left message to call pt needs mohs

## 2020-08-26 MED FILL — BACLOFEN 10 MG TABS: 10 | 30 days supply | Qty: 60 | Fill #6

## 2020-09-02 ENCOUNTER — Telehealth: Payer: Self-pay

## 2020-09-02 NOTE — Telephone Encounter (Signed)
Phone call from patient stating that the spot [facility], PA-C did scab over and she pulled the scab off and it started to bleed.  Patient states that she then used OTC antibiotic ointment and a Band-Aid and now she has developed bumps around the area and it's draining. Patient wanted to know what she could do?  I informed patient to stop using OTC antibiotic ointment and now to use band-aids, I also informed patient that if the area gets worse then she can go to an urgent care over the weekend to have the spot looked at.  Patient aware.

## 2020-09-06 ENCOUNTER — Other Ambulatory Visit: Payer: Self-pay | Admitting: Family Medicine

## 2020-09-07 MED FILL — ESTRADIOL 0.1 MG/GM CREA: 0.1 | 90 days supply | Qty: 43 | Fill #1

## 2020-09-10 DIAGNOSIS — C44219 Basal cell carcinoma of skin of left ear and external auricular canal: Secondary | ICD-10-CM | POA: Diagnosis not present

## 2020-09-17 DIAGNOSIS — C44311 Basal cell carcinoma of skin of nose: Secondary | ICD-10-CM | POA: Diagnosis not present

## 2020-09-28 MED FILL — BACLOFEN 10 MG TABS: 10 | 15 days supply | Qty: 30 | Fill #1

## 2020-10-14 ENCOUNTER — Encounter: Payer: Self-pay | Admitting: *Deleted

## 2020-10-16 MED FILL — BACLOFEN 10 MG TABS: 10 | 15 days supply | Qty: 30 | Fill #2

## 2020-11-23 ENCOUNTER — Encounter (INDEPENDENT_AMBULATORY_CARE_PROVIDER_SITE_OTHER): Payer: Self-pay | Admitting: Family Medicine

## 2020-11-23 MED ORDER — THYROID 90 MG PO TABS: ORAL_TABLET | ORAL | 0 refills | Status: DC

## 2020-12-10 MED FILL — ESTRADIOL 0.1 MG/GM CREA: 0.1 | 90 days supply | Qty: 43 | Fill #2

## 2020-12-24 ENCOUNTER — Encounter: Payer: Self-pay | Admitting: Physician Assistant

## 2020-12-24 ENCOUNTER — Ambulatory Visit (INDEPENDENT_AMBULATORY_CARE_PROVIDER_SITE_OTHER): Payer: 59 | Admitting: Physician Assistant

## 2020-12-24 ENCOUNTER — Other Ambulatory Visit: Payer: Self-pay

## 2020-12-24 DIAGNOSIS — L820 Inflamed seborrheic keratosis: Secondary | ICD-10-CM | POA: Diagnosis not present

## 2020-12-24 DIAGNOSIS — Z85828 Personal history of other malignant neoplasm of skin: Secondary | ICD-10-CM

## 2020-12-24 DIAGNOSIS — D485 Neoplasm of uncertain behavior of skin: Secondary | ICD-10-CM | POA: Diagnosis not present

## 2020-12-24 DIAGNOSIS — L570 Actinic keratosis: Secondary | ICD-10-CM | POA: Diagnosis not present

## 2020-12-24 DIAGNOSIS — L708 Other acne: Secondary | ICD-10-CM | POA: Diagnosis not present

## 2020-12-24 NOTE — Progress Notes (Signed)
Follow-Up Visit   Subjective  Mandy Holmes is a 70 y.o. female who presents for the following: Follow-up (Right cheek black head, ln2 right breast).   The following portions of the chart were reviewed this encounter and updated as appropriate:      Objective  Well appearing patient in no apparent distress; mood and affect are within normal limits.  All skin waist up examined.  Objective  Left Dorsal Hand, Left Forearm - Anterior (3), Right Dorsal Hand (4), Right Malar Cheek: rough, scaly skin patch  Objective  Left Root of Nose: Dark comedones      Objective  Right Melolabial Fold: Dark comedones      Objective  Right Inframammary Fold: Tan, raised lesion on a pink base  Objective  Left Ear, Right Nasal Sidewall: Scars post mohs clear   Assessment & Plan  AK (actinic keratosis) (9) Left Forearm - Anterior (3); Left Dorsal Hand; Right Dorsal Hand (4); Right Malar Cheek  Destruction of lesion - Left Dorsal Hand, Left Forearm - Anterior, Right Dorsal Hand, Right Malar Cheek Complexity: simple   Destruction method: cryotherapy   Informed consent: discussed and consent obtained   Timeout:  patient name, date of birth, surgical site, and procedure verified Lesion destroyed using liquid nitrogen: Yes   Cryotherapy cycles:  5 Outcome: patient tolerated procedure well with no complications   Post-procedure details: wound care instructions given    Neoplasm of uncertain behavior of skin (2) Left Root of Nose  Skin / nail biopsy Type of biopsy: punch   Informed consent: discussed and consent obtained   Timeout: patient name, date of birth, surgical site, and procedure verified   Procedure prep:  Patient was prepped and draped in usual sterile fashion (Non sterile) Prep type:  Chlorhexidine Anesthesia: the lesion was anesthetized in a standard fashion   Anesthetic:  1% lidocaine w/ epinephrine 1-100,000 local infiltration Punch size:  3 mm Suture size:   5-0 Suture type: nylon   Suture removal (days):  8 Hemostasis achieved with: suture   Outcome: patient tolerated procedure well   Post-procedure details: sterile dressing applied and wound care instructions given   Dressing type: bandage and petrolatum   Additional details:  1 suture  Specimen 1 - Surgical pathology Differential Diagnosis: r/o cyst  Check Margins: No  Right Melolabial Fold  Skin / nail biopsy Type of biopsy: punch   Informed consent: discussed and consent obtained   Timeout: patient name, date of birth, surgical site, and procedure verified   Procedure prep:  Patient was prepped and draped in usual sterile fashion (Non sterile) Prep type:  Chlorhexidine Anesthesia: the lesion was anesthetized in a standard fashion   Anesthetic:  1% lidocaine w/ epinephrine 1-100,000 local infiltration Punch size:  3 mm Suture size:  5-0 Suture type: nylon   Suture removal (days):  8 Hemostasis achieved with: suture   Outcome: patient tolerated procedure well   Post-procedure details: sterile dressing applied and wound care instructions given   Dressing type: bandage and petrolatum   Additional details:  1 suture  Specimen 2 - Surgical pathology Differential Diagnosis: r/o cyst  Check Margins: No  Inflamed seborrheic keratosis Right Inframammary Fold  Destruction of lesion - Right Inframammary Fold Complexity: simple   Destruction method: cryotherapy   Informed consent: discussed and consent obtained   Timeout:  patient name, date of birth, surgical site, and procedure verified Lesion destroyed using liquid nitrogen: Yes   Cryotherapy cycles:  5 Outcome: patient  tolerated procedure well with no complications   Post-procedure details: wound care instructions given    History of basal cell carcinoma (BCC) (2) Left Ear; Right Nasal Sidewall  observe    I, Chloeann Alfred, PA-C, have reviewed all documentation's for this visit.  The documentation on 12/24/20 for  the exam, diagnosis, procedures and orders are all accurate and complete.

## 2020-12-24 NOTE — Patient Instructions (Signed)

## 2020-12-31 ENCOUNTER — Other Ambulatory Visit: Payer: Self-pay

## 2020-12-31 ENCOUNTER — Ambulatory Visit (INDEPENDENT_AMBULATORY_CARE_PROVIDER_SITE_OTHER): Payer: 59 | Admitting: *Deleted

## 2020-12-31 DIAGNOSIS — D239 Other benign neoplasm of skin, unspecified: Secondary | ICD-10-CM

## 2020-12-31 NOTE — Progress Notes (Signed)
Patient here for suture removal x 1.  One stitch had already fallen out.  Both lesions healing good, no signs or symptoms of infection.  Pathologoy to patient.

## 2021-01-09 ENCOUNTER — Other Ambulatory Visit (HOSPITAL_COMMUNITY): Payer: Self-pay

## 2021-01-11 ENCOUNTER — Other Ambulatory Visit (INDEPENDENT_AMBULATORY_CARE_PROVIDER_SITE_OTHER): Payer: Self-pay | Admitting: Family Medicine

## 2021-01-12 ENCOUNTER — Other Ambulatory Visit (HOSPITAL_COMMUNITY): Payer: Self-pay

## 2021-01-12 ENCOUNTER — Other Ambulatory Visit: Payer: Self-pay | Admitting: Family Medicine

## 2021-01-12 MED ORDER — BACLOFEN 10 MG PO TABS
10.0000 mg | ORAL_TABLET | Freq: Two times a day (BID) | ORAL | 6 refills | Status: AC
  Filled 2021-01-12: qty 60, 30d supply, fill #0
  Filled 2021-03-15: qty 60, 30d supply, fill #1
  Filled 2021-05-17: qty 60, 30d supply, fill #2
  Filled 2021-07-13: qty 60, 30d supply, fill #3

## 2021-01-20 ENCOUNTER — Other Ambulatory Visit: Payer: Self-pay

## 2021-01-20 ENCOUNTER — Ambulatory Visit (INDEPENDENT_AMBULATORY_CARE_PROVIDER_SITE_OTHER): Payer: 59 | Admitting: Family Medicine

## 2021-01-20 ENCOUNTER — Encounter: Payer: Self-pay | Admitting: Family Medicine

## 2021-01-20 VITALS — BP 121/78 | HR 81 | Ht 64.25 in | Wt 188.2 lb

## 2021-01-20 DIAGNOSIS — Z6831 Body mass index (BMI) 31.0-31.9, adult: Secondary | ICD-10-CM

## 2021-01-20 DIAGNOSIS — R739 Hyperglycemia, unspecified: Secondary | ICD-10-CM | POA: Diagnosis not present

## 2021-01-20 DIAGNOSIS — Z9989 Dependence on other enabling machines and devices: Secondary | ICD-10-CM | POA: Diagnosis not present

## 2021-01-20 DIAGNOSIS — E6609 Other obesity due to excess calories: Secondary | ICD-10-CM

## 2021-01-20 DIAGNOSIS — Z0000 Encounter for general adult medical examination without abnormal findings: Secondary | ICD-10-CM

## 2021-01-20 DIAGNOSIS — G4733 Obstructive sleep apnea (adult) (pediatric): Secondary | ICD-10-CM | POA: Diagnosis not present

## 2021-01-20 DIAGNOSIS — E785 Hyperlipidemia, unspecified: Secondary | ICD-10-CM | POA: Diagnosis not present

## 2021-01-20 DIAGNOSIS — E039 Hypothyroidism, unspecified: Secondary | ICD-10-CM

## 2021-01-20 NOTE — Progress Notes (Signed)
Office Visit Note   Patient: Mandy Holmes           Date of Birth: [birth year]           MRN: [id number] Visit Date: 01/20/2021 Requested by: Eunice Blase, MD [address] [place],  Mandy Holmes [address] PCP: Eunice Blase, MD  Subjective: Chief Complaint  Patient presents with  . Annual Exam    HPI: She is here for her annual wellness exam.  History of hypothyroidism, controlled with medication from a laboratory standpoint but she still feels like her hands are too cold most of the time.  No other thyroid related symptoms.  Last year she had basal cell cancer of the nose treated with Mohs surgery.  She goes to her dermatologist on a regular basis.  Migraine headaches are well controlled.  Sleep apnea is controlled with CPAP.  Last time she was in prediabetes range.  She is trying to minimize sugar intake.  She has a history of uterine cancer.  She sees her gynecologist regularly.  She has venous insufficiency in her lower extremities but did not tolerate compression stockings.  She is due for mammogram and colonoscopy this year.  She has a history of benign colon polyps 5 years ago.  Family history was updated.                ROS:   All other systems were reviewed and are negative.  Objective: Vital Signs: BP 121/78   Pulse 81   Ht 5' 4.25" (1.632 m)   Wt 188 lb 3.2 oz (85.4 kg)   BMI 32.05 kg/m   Physical Exam:  General:  Alert and oriented, in no acute distress. Pulm:  Breathing unlabored. Psy:  Normal mood, congruent affect.  HEENT:  [place]/AT, PERRLA, EOM Full, no nystagmus.  Funduscopic examination within normal limits.  No conjunctival erythema.  Tympanic membranes are pearly gray with normal landmarks.  External ear canals are normal.  Nasal passages are clear.  Oropharynx is clear.  No significant lymphadenopathy.  No thyromegaly or nodules.  2+ carotid pulses without bruits. CV: Regular rate and rhythm without murmurs, rubs, or gallops.  1+ bilateral  lower extremity peripheral edema.  2+ radial and posterior tibial pulses. Lungs: Clear to auscultation throughout with no wheezing or areas of consolidation. Abd: Bowel sounds are active, no hepatosplenomegaly or masses.  Soft and nontender.  No audible bruits.  No evidence of ascites.    Imaging: No results found.  Assessment & Plan: 1.  Wellness examination -Proceed with mammogram and colonoscopy this year. -Labs drawn today.  2.  Hypothyroidism -Recheck today and adjust if needed.  3.  Prediabetes -Labs today.  Emphasized the importance of proper diet.  4.  Sleep apnea controlled with CPAP.  5.  Venous insufficiency -Prefers not using compression stockings.       Procedures: No procedures performed        PMFS History: Patient Active Problem List   Diagnosis Date Noted  . Class 1 obesity due to excess calories without serious comorbidity with body mass index (BMI) of 31.0 to 31.9 in adult 01/29/2018  . Hypercalcemia 09/15/2017  . Elevated blood pressure reading 09/15/2017  . Dyspareunia, female 12/22/2015  . Preventative health care 05/19/2015  . History of estrogen deficiency 05/19/2015  . Chronic fatigue 05/19/2015  . Hypothyroidism 05/19/2015  . History of uterine cancer 11/07/2013   Past Medical History:  Diagnosis Date  . Basal cell carcinoma 07/07/2020   right tips of  nose -(MOHS) Dr. Danielle Dess  . Cancer (HCC)    endometrial  . Constipation   . Headache(784.0)    migraines  . Hypothyroidism   . Migraine   . Sleep apnea    CPAP  . Venous insufficiency    bilateral lower legs    Family History  Problem Relation Age of Onset  . Uterine cancer Mother 104  . Stroke Mother   . Cancer Mother   . Skin cancer Brother        had at younger age; unsure if melanoma  . Diabetes Father   . Skin cancer Father        unsure if melanoma  . Cancer Father   . Kidney disease Sister        On dialysis  . Heart disease Brother   . Colon cancer Neg Hx   .  Breast cancer Neg Hx   . Thyroid cancer Neg Hx   . Thyroid disease Neg Hx     Past Surgical History:  Procedure Laterality Date  . APPENDECTOMY    . KNEE ARTHROSCOPY Left   . ROBOTIC ASSISTED TOTAL HYSTERECTOMY WITH BILATERAL SALPINGO OOPHERECTOMY N/A 12/03/2013   Procedure: ROBOTIC ASSISTED TOTAL HYSTERECTOMY WITH BILATERAL SALPINGO OOPHORECTOMY WITH BILATERAL LYMPH NODE DISSECTION;  Surgeon: Imagene Gurney A. Alycia Rossetti, MD;  Location: [facility];  Service: Gynecology;  Laterality: N/A;  . TUBAL LIGATION     Social History   Occupational History  . Not on file  Tobacco Use  . Smoking status: Never Smoker  . Smokeless tobacco: Never Used  Vaping Use  . Vaping Use: Never used  Substance and Sexual Activity  . Alcohol use: No    Alcohol/week: 0.0 standard drinks  . Drug use: No  . Sexual activity: Yes

## 2021-01-21 ENCOUNTER — Telehealth: Payer: Self-pay | Admitting: Family Medicine

## 2021-01-21 LAB — CBC WITH DIFFERENTIAL/PLATELET
Absolute Monocytes: 525 cells/uL (ref 200–950)
Basophils Absolute: 51 cells/uL (ref 0–200)
Basophils Relative: 0.8 %
Eosinophils Absolute: 90 cells/uL (ref 15–500)
Eosinophils Relative: 1.4 %
HCT: 43.3 % (ref 35.0–45.0)
Hemoglobin: 13.8 g/dL (ref 11.7–15.5)
Lymphs Abs: 1056 cells/uL (ref 850–3900)
MCH: 29.2 pg (ref 27.0–33.0)
MCHC: 31.9 g/dL — ABNORMAL LOW (ref 32.0–36.0)
MCV: 91.7 fL (ref 80.0–100.0)
MPV: 9.5 fL (ref 7.5–12.5)
Monocytes Relative: 8.2 %
Neutro Abs: 4678 cells/uL (ref 1500–7800)
Neutrophils Relative %: 73.1 %
Platelets: 362 10*3/uL (ref 140–400)
RBC: 4.72 10*6/uL (ref 3.80–5.10)
RDW: 11.9 % (ref 11.0–15.0)
Total Lymphocyte: 16.5 %
WBC: 6.4 10*3/uL (ref 3.8–10.8)

## 2021-01-21 LAB — COMPREHENSIVE METABOLIC PANEL
AG Ratio: 1.8 (calc) (ref 1.0–2.5)
ALT: 34 U/L — ABNORMAL HIGH (ref 6–29)
AST: 22 U/L (ref 10–35)
Albumin: 4.6 g/dL (ref 3.6–5.1)
Alkaline phosphatase (APISO): 141 U/L (ref 37–153)
BUN: 17 mg/dL (ref 7–25)
CO2: 24 mmol/L (ref 20–32)
Calcium: 10.9 mg/dL — ABNORMAL HIGH (ref 8.6–10.4)
Chloride: 103 mmol/L (ref 98–110)
Creat: 0.69 mg/dL (ref 0.60–0.93)
Globulin: 2.5 g/dL (calc) (ref 1.9–3.7)
Glucose, Bld: 111 mg/dL — ABNORMAL HIGH (ref 65–99)
Potassium: 4.5 mmol/L (ref 3.5–5.3)
Sodium: 139 mmol/L (ref 135–146)
Total Bilirubin: 0.4 mg/dL (ref 0.2–1.2)
Total Protein: 7.1 g/dL (ref 6.1–8.1)

## 2021-01-21 LAB — THYROID PANEL WITH TSH
Free Thyroxine Index: 1.8 (ref 1.4–3.8)
T3 Uptake: 30 % (ref 22–35)
T4, Total: 6.1 ug/dL (ref 5.1–11.9)
TSH: 2.28 mIU/L (ref 0.40–4.50)

## 2021-01-21 LAB — HEMOGLOBIN A1C
Hgb A1c MFr Bld: 5.7 % of total Hgb — ABNORMAL HIGH (ref <5.7)
Mean Plasma Glucose: 117 mg/dL
eAG (mmol/L): 6.5 mmol/L

## 2021-01-21 LAB — LIPID PANEL
Cholesterol: 271 mg/dL — ABNORMAL HIGH (ref <200)
HDL: 51 mg/dL (ref >=50)
LDL Cholesterol (Calc): 177 mg/dL (calc) — ABNORMAL HIGH
Non-HDL Cholesterol (Calc): 220 mg/dL (calc) — ABNORMAL HIGH (ref <130)
Total CHOL/HDL Ratio: 5.3 (calc) — ABNORMAL HIGH (ref <5.0)
Triglycerides: 233 mg/dL — ABNORMAL HIGH (ref <150)

## 2021-01-21 NOTE — Telephone Encounter (Signed)
Labs are notable for the following:  Thyroid looks good with TSH of 2.28.  CBC is normal.  Blood glucose is in prediabetes range at 111.  Hemoglobin A1c looks a little better at 5.7.  Calcium level is slightly elevated at 10.9.  We need to recheck this in 2 to 3 months.  Lipid panel is abnormal, particularly the triglycerides relative to the HDL.  Continue with efforts to minimize intake of sugar, breads, pastas, cereals.  Also continue with efforts to exercise.  Recheck in 3 to 6 months.

## 2021-02-26 ENCOUNTER — Other Ambulatory Visit (HOSPITAL_COMMUNITY): Payer: Self-pay

## 2021-02-26 ENCOUNTER — Encounter: Payer: Self-pay | Admitting: Family Medicine

## 2021-02-26 MED ORDER — THYROID 90 MG PO TABS
45.0000 mg | ORAL_TABLET | Freq: Every day | ORAL | 3 refills | Status: DC
  Filled 2021-02-26: qty 45, 90d supply, fill #0

## 2021-03-04 ENCOUNTER — Other Ambulatory Visit (HOSPITAL_COMMUNITY): Payer: Self-pay

## 2021-03-04 MED FILL — Estradiol Vaginal Cream 0.1 MG/GM: VAGINAL | 84 days supply | Qty: 42.5 | Fill #0 | Status: AC

## 2021-03-15 ENCOUNTER — Other Ambulatory Visit (HOSPITAL_COMMUNITY): Payer: Self-pay

## 2021-03-26 ENCOUNTER — Other Ambulatory Visit (HOSPITAL_COMMUNITY): Payer: Self-pay

## 2021-03-26 ENCOUNTER — Encounter: Payer: Self-pay | Admitting: Family Medicine

## 2021-03-26 ENCOUNTER — Other Ambulatory Visit: Payer: Self-pay

## 2021-03-26 ENCOUNTER — Ambulatory Visit (INDEPENDENT_AMBULATORY_CARE_PROVIDER_SITE_OTHER): Payer: 59 | Admitting: Family Medicine

## 2021-03-26 DIAGNOSIS — S80812S Abrasion, left lower leg, sequela: Secondary | ICD-10-CM | POA: Diagnosis not present

## 2021-03-26 DIAGNOSIS — M79605 Pain in left leg: Secondary | ICD-10-CM | POA: Diagnosis not present

## 2021-03-26 MED ORDER — THYROID 90 MG PO TABS: 45.0000 mg | ORAL_TABLET | Freq: Every day | ORAL | 3 refills | Status: AC

## 2021-03-26 MED ORDER — MUPIROCIN 2 % EX OINT
1.0000 "application " | TOPICAL_OINTMENT | Freq: Two times a day (BID) | CUTANEOUS | 3 refills | Status: AC
  Filled 2021-03-26: qty 22, 11d supply, fill #0
  Filled 2021-05-30: qty 22, 11d supply, fill #1
  Filled 2021-07-14: qty 22, 11d supply, fill #2

## 2021-03-26 NOTE — Progress Notes (Signed)
Has a scab that wont heal-been present for over 1 month Hernia buldging more -present since 2020 Strains when she uses the restroom  Does drink a lot of water Been taking mirlax since last week  Been using clobetasol on wound for 1 week Took some keflex she had at her house

## 2021-03-26 NOTE — Progress Notes (Signed)
Office Visit Note   Patient: Mandy Holmes           Date of Birth: [birth year]           MRN: [id number] Visit Date: 03/26/2021 Requested by: Eunice Blase, MD [address] [place],  [place] [address] PCP: Eunice Blase, MD  Subjective: Chief Complaint  Patient presents with   Left Leg - Wound Check   Hernia    HPI: She is here with left leg wound.  About a month ago a car door opened and hit her in the leg causing bleeding.  She treated the wound with ointment and a Band-Aid and unfortunately it does not seem to be healing.  No fevers or chills.  She notes that she has dealt with venous insufficiency since her 60s.  Frequently it takes scabs quite a long time to heal.  Also she was given Armour Thyroid instead of NP thyroid and this does not work as well for her, it is very difficult to cut each tablet.               ROS:   All other systems were reviewed and are negative.  Objective: Vital Signs: There were no vitals taken for this visit.  Physical Exam:  General:  Alert and oriented, in no acute distress. Pulm:  Breathing unlabored. Psy:  Normal mood, congruent affect. Skin: There is a shallow ulcer on the anterolateral left lower leg with good granulation tissue, no sign of cellulitis.  She has 1-2+ pitting edema in both lower extremities.   Imaging: No results found.  Assessment & Plan: Slow healing left leg wound, probably related to venous insufficiency -Bactroban ointment.  Consider applying compressive stockings, although these are very difficult for her to apply.  If the wound persist, we could treat with Dynaflex wraps.  2.  Hypothyroid -NP thyroid sent to her pharmacy.     Procedures: No procedures performed        PMFS History: Patient Active Problem List   Diagnosis Date Noted   OSA on CPAP 01/20/2021   Class 1 obesity due to excess calories without serious comorbidity with body mass index (BMI) of 31.0 to 31.9 in adult 01/29/2018    Hypercalcemia 09/15/2017   Elevated blood pressure reading 09/15/2017   Dyspareunia, female 12/22/2015   Preventative health care 05/19/2015   History of estrogen deficiency 05/19/2015   Chronic fatigue 05/19/2015   Hypothyroidism 05/19/2015   History of uterine cancer 11/07/2013   Past Medical History:  Diagnosis Date   Basal cell carcinoma 07/07/2020   right tips of nose -(MOHS) Dr. Danielle Dess   Cancer [facility])    endometrial   Constipation    Headache(784.0)    migraines   Hypothyroidism    Migraine    Sleep apnea    CPAP   Venous insufficiency    bilateral lower legs    Family History  Problem Relation Age of Onset   Uterine cancer Mother 84   Stroke Mother    Cancer Mother    Skin cancer Brother        had at younger age; unsure if melanoma   Diabetes Father    Skin cancer Father        unsure if melanoma   Cancer Father    Kidney disease Sister        On dialysis   Heart disease Brother    Colon cancer Neg Hx    Breast cancer Neg Hx  Thyroid cancer Neg Hx    Thyroid disease Neg Hx     Past Surgical History:  Procedure Laterality Date   APPENDECTOMY     KNEE ARTHROSCOPY Left    ROBOTIC ASSISTED TOTAL HYSTERECTOMY WITH BILATERAL SALPINGO OOPHERECTOMY N/A 12/03/2013   Procedure: ROBOTIC ASSISTED TOTAL HYSTERECTOMY WITH BILATERAL SALPINGO OOPHORECTOMY WITH BILATERAL LYMPH NODE DISSECTION;  Surgeon: Imagene Gurney A. Alycia Rossetti, MD;  Location: [facility];  Service: Gynecology;  Laterality: N/A;   TUBAL LIGATION     Social History   Occupational History   Not on file  Tobacco Use   Smoking status: Never   Smokeless tobacco: Never  Vaping Use   Vaping Use: Never used  Substance and Sexual Activity   Alcohol use: No    Alcohol/week: 0.0 standard drinks   Drug use: No   Sexual activity: Yes

## 2021-03-31 ENCOUNTER — Encounter: Payer: Self-pay | Admitting: Family Medicine

## 2021-04-01 ENCOUNTER — Other Ambulatory Visit: Payer: Self-pay

## 2021-04-01 ENCOUNTER — Telehealth: Payer: Self-pay | Admitting: Family Medicine

## 2021-04-01 NOTE — Telephone Encounter (Signed)
Tried calling the patient to let her know the referral has not been placed yet, but soon will be - could not reach her at the number provided today (the extension where I was transferred just kept ringing). Will try again later.

## 2021-04-01 NOTE — Telephone Encounter (Signed)
Patient called concerning the referral to [facility]. Patient  advised she called over there and they do not have the referral yet. The number to contact patient is [phone]

## 2021-04-02 ENCOUNTER — Encounter: Payer: Self-pay | Admitting: Family Medicine

## 2021-04-02 ENCOUNTER — Other Ambulatory Visit: Payer: Self-pay

## 2021-04-02 DIAGNOSIS — K439 Ventral hernia without obstruction or gangrene: Secondary | ICD-10-CM

## 2021-04-02 NOTE — Telephone Encounter (Signed)
Left message on the patient's home voice mail to call me back. Her work # will not work for today, as the office is closed.

## 2021-04-02 NOTE — Telephone Encounter (Signed)
General surgery referral has now been placed -- [facility], Dr. Donne Hazel or Dr. Ninfa Linden - left abdominal hernia.

## 2021-04-06 ENCOUNTER — Other Ambulatory Visit: Payer: Self-pay | Admitting: Family Medicine

## 2021-04-06 DIAGNOSIS — Z1231 Encounter for screening mammogram for malignant neoplasm of breast: Secondary | ICD-10-CM

## 2021-04-20 ENCOUNTER — Encounter: Payer: Self-pay | Admitting: Family Medicine

## 2021-05-17 ENCOUNTER — Other Ambulatory Visit (HOSPITAL_COMMUNITY): Payer: Self-pay

## 2021-05-24 DIAGNOSIS — K432 Incisional hernia without obstruction or gangrene: Secondary | ICD-10-CM | POA: Diagnosis not present

## 2021-05-26 ENCOUNTER — Other Ambulatory Visit (HOSPITAL_COMMUNITY): Payer: Self-pay

## 2021-05-26 MED FILL — Estradiol Vaginal Cream 0.1 MG/GM: VAGINAL | 84 days supply | Qty: 42.5 | Fill #1 | Status: AC

## 2021-05-27 ENCOUNTER — Other Ambulatory Visit: Payer: Self-pay | Admitting: General Surgery

## 2021-05-27 DIAGNOSIS — K432 Incisional hernia without obstruction or gangrene: Secondary | ICD-10-CM

## 2021-05-31 ENCOUNTER — Other Ambulatory Visit (HOSPITAL_COMMUNITY): Payer: Self-pay

## 2021-06-04 ENCOUNTER — Other Ambulatory Visit (HOSPITAL_COMMUNITY): Payer: Self-pay

## 2021-06-04 ENCOUNTER — Other Ambulatory Visit: Payer: Self-pay

## 2021-06-04 ENCOUNTER — Ambulatory Visit
Admission: RE | Admit: 2021-06-04 | Discharge: 2021-06-04 | Disposition: A | Discharge status: Home / Self Care | Payer: 59 | Source: Ambulatory Visit | Attending: Family Medicine | Admitting: Family Medicine

## 2021-06-04 DIAGNOSIS — Z1231 Encounter for screening mammogram for malignant neoplasm of breast: Secondary | ICD-10-CM | POA: Diagnosis not present

## 2021-06-07 ENCOUNTER — Other Ambulatory Visit (HOSPITAL_COMMUNITY): Payer: Self-pay

## 2021-06-07 DIAGNOSIS — Z01419 Encounter for gynecological examination (general) (routine) without abnormal findings: Secondary | ICD-10-CM | POA: Diagnosis not present

## 2021-06-07 MED ORDER — ESTRADIOL 0.1 MG/GM VA CREA
TOPICAL_CREAM | VAGINAL | 1 refills | Status: AC
  Filled 2021-06-07 – 2021-09-13 (×2): qty 42.5, 90d supply, fill #0

## 2021-06-17 ENCOUNTER — Other Ambulatory Visit: Payer: Self-pay

## 2021-06-17 ENCOUNTER — Ambulatory Visit
Admission: RE | Admit: 2021-06-17 | Discharge: 2021-06-17 | Disposition: A | Discharge status: Home / Self Care | Payer: 59 | Source: Ambulatory Visit | Attending: General Surgery | Admitting: General Surgery

## 2021-06-17 DIAGNOSIS — K5730 Diverticulosis of large intestine without perforation or abscess without bleeding: Secondary | ICD-10-CM | POA: Diagnosis not present

## 2021-06-17 DIAGNOSIS — K432 Incisional hernia without obstruction or gangrene: Secondary | ICD-10-CM

## 2021-06-17 DIAGNOSIS — K760 Fatty (change of) liver, not elsewhere classified: Secondary | ICD-10-CM | POA: Diagnosis not present

## 2021-06-17 MED ORDER — IOPAMIDOL (ISOVUE-300) INJECTION 61%
100.0000 mL | Freq: Once | INTRAVENOUS | Status: AC | PRN
  Administered 2021-06-17: 100 mL via INTRAVENOUS

## 2021-07-08 ENCOUNTER — Other Ambulatory Visit: Payer: Self-pay | Admitting: General Surgery

## 2021-07-08 NOTE — Progress Notes (Signed)
Sent message, via epic in basket, requesting orders in epic from surgeon.  

## 2021-07-13 ENCOUNTER — Other Ambulatory Visit (HOSPITAL_COMMUNITY): Payer: Self-pay

## 2021-07-14 ENCOUNTER — Ambulatory Visit (INDEPENDENT_AMBULATORY_CARE_PROVIDER_SITE_OTHER): Payer: 59 | Admitting: Otolaryngology

## 2021-07-14 ENCOUNTER — Other Ambulatory Visit: Payer: Self-pay

## 2021-07-14 DIAGNOSIS — H903 Sensorineural hearing loss, bilateral: Secondary | ICD-10-CM

## 2021-07-14 DIAGNOSIS — H6123 Impacted cerumen, bilateral: Secondary | ICD-10-CM | POA: Diagnosis not present

## 2021-07-14 NOTE — Progress Notes (Signed)
HPI: Mandy Holmes is a 70 y.o. female who presents for evaluation of wax buildup in her ears.  She felt something in her ear and used her finger and pulled wax out.  She wanted her ears checked and cleaned.  Past Medical History:  Diagnosis Date   Basal cell carcinoma 07/07/2020   right tips of nose -([facility]) Dr. Danielle Dess   Cancer [facility])    endometrial   Constipation    Headache(784.0)    migraines   Hypothyroidism    Migraine    Sleep apnea    CPAP   Venous insufficiency    bilateral lower legs   Past Surgical History:  Procedure Laterality Date   APPENDECTOMY     KNEE ARTHROSCOPY Left    ROBOTIC ASSISTED TOTAL HYSTERECTOMY WITH BILATERAL SALPINGO OOPHERECTOMY N/A 12/03/2013   Procedure: ROBOTIC ASSISTED TOTAL HYSTERECTOMY WITH BILATERAL SALPINGO OOPHORECTOMY WITH BILATERAL LYMPH NODE DISSECTION;  Surgeon: Imagene Gurney A. Alycia Rossetti, MD;  Location: [facility];  Service: Gynecology;  Laterality: N/A;   TUBAL LIGATION     Social History   Socioeconomic History   Marital status: Married    Spouse name: Not on file   Number of children: Not on file   Years of education: Not on file   Highest education level: Not on file  Occupational History   Not on file  Tobacco Use   Smoking status: Never   Smokeless tobacco: Never  Vaping Use   Vaping Use: Never used  Substance and Sexual Activity   Alcohol use: No    Alcohol/week: 0.0 standard drinks   Drug use: No   Sexual activity: Yes  Other Topics Concern   Not on file  Social History Narrative   Not on file   Social Determinants of Health   Financial Resource Strain: Not on file  Food Insecurity: Not on file  Transportation Needs: Not on file  Physical Activity: Not on file  Stress: Not on file  Social Connections: Not on file   Family History  Problem Relation Age of Onset   Uterine cancer Mother 51   Stroke Mother    Cancer Mother    Skin cancer Brother        had at younger age; unsure if melanoma   Diabetes Father     Skin cancer Father        unsure if melanoma   Cancer Father    Kidney disease Sister        On dialysis   Heart disease Brother    Colon cancer Neg Hx    Breast cancer Neg Hx    Thyroid cancer Neg Hx    Thyroid disease Neg Hx    No Known Allergies Prior to Admission medications   Medication Sig Start Date End Date Taking? Authorizing Provider  Ascorbic Acid (VITAMIN C) 1000 MG tablet Take 5,000 mg by mouth daily.    [provider]  Aspirin-Salicylamide-Caffeine (BC HEADACHE) 325-95-16 MG TABS Take 1 Package by mouth daily as needed (Migraine).    [provider]  b complex vitamins tablet Take 1 tablet by mouth daily.    [provider]  baclofen (LIORESAL) 10 MG tablet TAKE 1/2 TO 1 TABLET BY MOUTH 2 TIMES A DAY AS NEEDED FOR MUSCLE SPASMS. Patient taking differently: Take 10 mg by mouth daily. 01/12/21   Hilts, Legrand Como, MD  Cholecalciferol (VITAMIN D3) 125 MCG (5000 UT) TABS Take 5,000 Units by mouth daily.    [provider]  Cyanocobalamin (  VITAMIN B-12) 5000 MCG TBDP Take 5,000 mcg by mouth daily.    [provider]  estradiol (ESTRACE) 0.1 MG/GM vaginal cream INSERT 1 GRAM INTO THE VAGINA TWICE A WEEK Patient taking differently: Place vaginally. Wednesday and sunday 06/07/21     ibuprofen (ADVIL) 200 MG tablet Take 200 mg by mouth every 6 (six) hours as needed.    [provider]  L-LYSINE PO Take 1,000 mg by mouth daily.     [provider]  MAGNESIUM GLYCINATE PLUS PO Take 400 mg by mouth daily.     [provider]  Menaquinone-7 (VITAMIN K2 PO) Take 200 mg by mouth daily.    [provider]  mupirocin ointment (BACTROBAN) 2 % Apply 1 application topically 2 (two) times daily. Patient not taking: Reported on 07/09/2021 03/26/21   Hilts, Legrand Como, MD  OVER THE COUNTER MEDICATION Take 300 mg by mouth daily. DIM Supplement  Estrogen    [provider]  OVER THE COUNTER MEDICATION Take 100 mg by  mouth daily. DHEA    [provider]  Propylene Glycol (SYSTANE BALANCE) 0.6 % SOLN Place 1 drop into both eyes daily as needed (dry eye).    [provider]  thyroid (NP THYROID) 90 MG tablet Take 0.5 tablets (45 mg total) by mouth daily. 03/26/21   Hilts, Michael, MD  Zinc 25 MG TABS Take 25 mg by mouth daily.    [provider]     Positive ROS: Otherwise negative  All other systems have been reviewed and were otherwise negative with the exception of those mentioned in the HPI and as above.  Physical Exam: Constitutional: Alert, well-appearing, no acute distress Ears: External ears without lesions or tenderness. Ear canals with minimal wax buildup that was cleaned with curette.  This was nonobstructing.  TMs were otherwise clear with no hears or foreign bodies in the ear canals..  On tuning fork testing she had a mild bilateral symmetric sensorineural hearing loss. Nasal: External nose without lesions. Clear nasal passages Oral: Oropharynx clear. Neck: No palpable adenopathy or masses Respiratory: Breathing comfortably  Skin: No facial/neck lesions or rash noted.  Cerumen impaction removal  Date/Time: 07/14/2021 10:11 AM Performed by: Rozetta Nunnery, MD Authorized by: Rozetta Nunnery, MD   Consent:    Consent obtained:  Verbal   Consent given by:  Patient   Risks discussed:  Pain and bleeding Procedure details:    Location:  L ear and R ear   Procedure type: curette   Post-procedure details:    Inspection:  TM intact and canal normal   Hearing quality:  Improved   Procedure completion:  Tolerated well, no immediate complications Comments:     Minimal wax buildup in both ear canals that was cleaned with curette.  TMs were otherwise clear  Assessment: Minimal wax buildup and reassured patient of normal ear canals and TMs otherwise.  She did have mild sensorineural hearing loss on tuning fork testing.  Plan: Ear canals were cleaned in  the office today.  TMs were otherwise clear.  Radene Journey, MD

## 2021-07-15 ENCOUNTER — Other Ambulatory Visit (HOSPITAL_COMMUNITY): Payer: Self-pay

## 2021-07-16 ENCOUNTER — Encounter (HOSPITAL_COMMUNITY): Payer: 59

## 2021-07-16 ENCOUNTER — Other Ambulatory Visit (HOSPITAL_COMMUNITY): Payer: Self-pay

## 2021-07-16 DIAGNOSIS — N764 Abscess of vulva: Secondary | ICD-10-CM | POA: Diagnosis not present

## 2021-07-16 DIAGNOSIS — B009 Herpesviral infection, unspecified: Secondary | ICD-10-CM | POA: Diagnosis not present

## 2021-07-16 DIAGNOSIS — N760 Acute vaginitis: Secondary | ICD-10-CM | POA: Diagnosis not present

## 2021-07-16 MED ORDER — VALACYCLOVIR HCL 1 G PO TABS
1000.0000 mg | ORAL_TABLET | Freq: Two times a day (BID) | ORAL | 4 refills | Status: AC
  Filled 2021-07-16: qty 30, 15d supply, fill #0

## 2021-07-21 NOTE — Progress Notes (Addendum)
COVID swab appointment: 07-27-21  COVID Vaccine Completed:  Yes x1 Date COVID Vaccine completed: 06-20-20 Has received booster: COVID vaccine manufacturer: [employer]   Date of COVID positive in last 90 days: No  PCP - Eunice Blase, MD Cardiologist - N/A  Chest x-ray -  N/A EKG -  N/A Stress Test -  N/A ECHO -  N/A Cardiac Cath -  N/A Pacemaker/ICD device last checked: Spinal Cord Stimulator:  Sleep Study - Yes, +sleep apnea CPAP - Yes  Fasting Blood Sugar -  N/A Checks Blood Sugar _____ times a day  Blood Thinner Instructions: N/A Aspirin Instructions: Last Dose:  Activity level:  Can go up a flight of stairs and perform activities of daily living without stopping and without symptoms of chest pain or shortness of breath.  Able to exercise without symptoms  Anesthesia review:  N/A  Patient denies shortness of breath, fever, cough and chest pain at PAT appointment   Patient verbalized understanding of instructions that were given to them at the PAT appointment. Patient was also instructed that they will need to review over the PAT instructions again at home before surgery.

## 2021-07-21 NOTE — Patient Instructions (Addendum)
DUE TO COVID-19 ONLY ONE VISITOR IS ALLOWED TO COME WITH YOU AND STAY IN THE WAITING ROOM ONLY DURING PRE OP AND PROCEDURE.   **NO VISITORS ARE ALLOWED IN THE SHORT STAY AREA OR RECOVERY ROOM!!**  IF YOU WILL BE ADMITTED INTO THE HOSPITAL YOU ARE ALLOWED ONLY TWO SUPPORT PEOPLE DURING VISITATION HOURS ONLY (7 AM -8PM)    Up to two visitors ages 64+ are allowed at one time in a patient's room.  The visitors may rotate out with other people throughout the day.  Additionally, up to two children between the ages of 19 and 49 are allowed and do not count toward the number of allowed visitors.  Children within this age range must be accompanied by an adult visitor.  One adult visitor may remain with the patient overnight and must be in the room by 8 PM.  COVID SWAB TESTING MUST BE COMPLETED ON:  Tuesday, 07-27-21 Between the hours of 8 and 3  **MUST PRESENT COMPLETED FORM AT TESTING SITE**    [place] (backside of the building)  You are not required to quarantine, however you are required to wear a well-fitted mask when you are out and around people not in your household.  Hand Hygiene often Do NOT share personal items Notify your provider if you are in close contact with someone who has COVID or you develop fever 100.4 or greater, new onset of sneezing, cough, sore throat, shortness of breath or body aches.        Your procedure is scheduled on:  Thursday, 07-29-21   Report to [facility] Main  Entrance   Report to Short Stay at 5:15 AM   [facility])     Call this number if you have problems the morning of surgery [phone]   Do not eat food :After Midnight.   May have liquids until 4:30 AM day of surgery  CLEAR LIQUID DIET  Foods Allowed                                                                     Foods Excluded  Water, Black Coffee (no milk/no creamer) and tea, regular and decaf                              liquids that you cannot   Plain Jell-O in any flavor  (No red)                         see through such as: Fruit ices (not with fruit pulp)                                 milk, soups, orange juice  Iced Popsicles (No red)                                    All solid food  Apple juices Sports drinks like Gatorade (No red) Lightly seasoned clear broth or consume(fat free) Sugar    Complete one Ensure drink the morning of surgery at 4:30 AM the day of surgery.     The day of surgery:  Drink ONE (1) Pre-Surgery Clear Ensure the morning of surgery. Drink in one sitting. Do not sip.  This drink was given to you during your hospital  pre-op appointment visit. Nothing else to drink after completing the  Pre-Surgery Clear Ensure or G2.          If you have questions, please contact your surgeon's office.     Oral Hygiene is also important to reduce your risk of infection.                                    Remember - BRUSH YOUR TEETH THE MORNING OF SURGERY WITH YOUR REGULAR TOOTHPASTE   Do NOT smoke after Midnight   Take these medicines the morning of surgery with A SIP OF WATER:  NP Thyroid   Stop all vitamins and herbal supplements a week before surgery   Bring CPAP mask and tubing day of surgery             You may not have any metal on your body including hair pins, jewelry, and body piercing             Do not wear make-up, lotions, powders, perfumes or deodorant  Do not wear nail polish including gel and S&S, artificial/acrylic nails, or any other type of covering on natural nails including finger and toenails. If you have artificial nails, gel coating, etc. that needs to be removed by a nail salon please have this removed prior to surgery or surgery may need to be canceled/ delayed if the surgeon/ anesthesia feels like they are unable to be safely monitored.   Do not shave  48 hours prior to surgery.   Do not bring valuables to the hospital. [place].   Contacts, dentures or bridgework may not be worn into surgery.   Bring small overnight bag day of surgery.  Please read over the following fact sheets you were given: IF YOU HAVE QUESTIONS ABOUT YOUR PRE OP INSTRUCTIONS PLEASE CALL [facility] - Preparing for Surgery Before surgery, you can play an important role.  Because skin is not sterile, your skin needs to be as free of germs as possible.  You can reduce the number of germs on your skin by washing with CHG (chlorahexidine gluconate) soap before surgery.  CHG is an antiseptic cleaner which kills germs and bonds with the skin to continue killing germs even after washing. Please DO NOT use if you have an allergy to CHG or antibacterial soaps.  If your skin becomes reddened/irritated stop using the CHG and inform your nurse when you arrive at Short Stay. Do not shave (including legs and underarms) for at least 48 hours prior to the first CHG shower.  You may shave your face/neck.  Please follow these instructions carefully:  1.  Shower with CHG Soap the night before surgery and the  morning of surgery.  2.  If you choose to wash your hair, wash your hair first as usual with your normal  shampoo.  3.  After you shampoo, rinse your hair and body thoroughly to remove the shampoo.  4.  Use CHG as you would any other liquid soap.  You can apply chg directly to the skin and wash.  Gently with a scrungie or clean washcloth.  5.  Apply the CHG Soap to your body ONLY FROM THE NECK DOWN.   Do   not use on face/ open                           Wound or open sores. Avoid contact with eyes, ears mouth and   genitals (private parts).                       Wash face,  Genitals (private parts) with your normal soap.             6.  Wash thoroughly, paying special attention to the area where your    surgery  will be performed.  7.  Thoroughly rinse your body with warm water from the neck  down.  8.  DO NOT shower/wash with your normal soap after using and rinsing off the CHG Soap.                9.  Pat yourself dry with a clean towel.            10.  Wear clean pajamas.            11.  Place clean sheets on your bed the night of your first shower and do not  sleep with pets. Day of Surgery : Do not apply any lotions/deodorants the morning of surgery.  Please wear clean clothes to the hospital/surgery center.  FAILURE TO FOLLOW THESE INSTRUCTIONS MAY RESULT IN THE CANCELLATION OF YOUR SURGERY  PATIENT SIGNATURE_________________________________  NURSE SIGNATURE__________________________________  ________________________________________________________________________

## 2021-07-23 ENCOUNTER — Other Ambulatory Visit: Payer: Self-pay

## 2021-07-23 ENCOUNTER — Encounter (HOSPITAL_COMMUNITY)
Admission: RE | Admit: 2021-07-23 | Discharge: 2021-07-23 | Disposition: A | Discharge status: Home / Self Care | Payer: 59 | Source: Ambulatory Visit | Attending: General Surgery | Admitting: General Surgery

## 2021-07-23 ENCOUNTER — Encounter (HOSPITAL_COMMUNITY): Payer: Self-pay

## 2021-07-23 DIAGNOSIS — Z01818 Encounter for other preprocedural examination: Secondary | ICD-10-CM | POA: Diagnosis not present

## 2021-07-23 LAB — CBC
HCT: 41.8 % (ref 36.0–46.0)
Hemoglobin: 13.5 g/dL (ref 12.0–15.0)
MCH: 30.5 pg (ref 26.0–34.0)
MCHC: 32.3 g/dL (ref 30.0–36.0)
MCV: 94.6 fL (ref 80.0–100.0)
Platelets: 327 10*3/uL (ref 150–400)
RBC: 4.42 MIL/uL (ref 3.87–5.11)
RDW: 13.3 % (ref 11.5–15.5)
WBC: 6.9 10*3/uL (ref 4.0–10.5)
nRBC: 0 % (ref 0.0–0.2)

## 2021-07-27 ENCOUNTER — Other Ambulatory Visit: Payer: Self-pay | Admitting: General Surgery

## 2021-07-27 LAB — SARS CORONAVIRUS 2 (TAT 6-24 HRS): SARS Coronavirus 2: NEGATIVE

## 2021-07-28 NOTE — Anesthesia Preprocedure Evaluation (Addendum)
Anesthesia Evaluation  Patient identified by MRN, date of birth, ID band Patient awake    Reviewed: Allergy & Precautions, NPO status , Patient's Chart, lab work & pertinent test results  Airway Mallampati: II  TM Distance: >3 FB Neck ROM: Full    Dental no notable dental hx.    Pulmonary neg pulmonary ROS,    Pulmonary exam normal breath sounds clear to auscultation       Cardiovascular negative cardio ROS Normal cardiovascular exam Rhythm:Regular Rate:Normal     Neuro/Psych  Headaches, negative psych ROS   GI/Hepatic negative GI ROS, Neg liver ROS,   Endo/Other  Hypothyroidism   Renal/GU negative Renal ROS  negative genitourinary   Musculoskeletal negative musculoskeletal ROS (+)   Abdominal   Peds negative pediatric ROS (+)  Hematology negative hematology ROS (+)   Anesthesia Other Findings   Reproductive/Obstetrics negative OB ROS                            Anesthesia Physical Anesthesia Plan  ASA: 2  Anesthesia Plan: General   Post-op Pain Management:  Regional for Post-op pain   Induction: Intravenous  PONV Risk Score and Plan: 3 and Ondansetron, Dexamethasone, Droperidol and Treatment may vary due to age or medical condition  Airway Management Planned: Oral ETT  Additional Equipment:   Intra-op Plan:   Post-operative Plan: Extubation in OR  Informed Consent: I have reviewed the patients History and Physical, chart, labs and discussed the procedure including the risks, benefits and alternatives for the proposed anesthesia with the patient or authorized representative who has indicated his/her understanding and acceptance.     Dental advisory given  Plan Discussed with: CRNA and Surgeon  Anesthesia Plan Comments:         Anesthesia Quick Evaluation

## 2021-07-29 ENCOUNTER — Other Ambulatory Visit (HOSPITAL_COMMUNITY): Payer: Self-pay

## 2021-07-29 ENCOUNTER — Ambulatory Visit (HOSPITAL_COMMUNITY)
Admission: RE | Admit: 2021-07-29 | Discharge: 2021-07-29 | Disposition: A | Discharge status: Home / Self Care | Payer: 59 | Attending: General Surgery | Admitting: General Surgery

## 2021-07-29 ENCOUNTER — Ambulatory Visit (HOSPITAL_COMMUNITY): Payer: 59 | Admitting: Anesthesiology

## 2021-07-29 ENCOUNTER — Encounter (HOSPITAL_COMMUNITY): Payer: Self-pay | Admitting: General Surgery

## 2021-07-29 ENCOUNTER — Encounter (HOSPITAL_COMMUNITY)
Admission: RE | Disposition: A | Discharge status: Home / Self Care | Payer: Self-pay | Source: Home / Self Care | Attending: General Surgery

## 2021-07-29 DIAGNOSIS — K432 Incisional hernia without obstruction or gangrene: Secondary | ICD-10-CM | POA: Diagnosis not present

## 2021-07-29 DIAGNOSIS — G4733 Obstructive sleep apnea (adult) (pediatric): Secondary | ICD-10-CM | POA: Diagnosis not present

## 2021-07-29 DIAGNOSIS — G8918 Other acute postprocedural pain: Secondary | ICD-10-CM | POA: Diagnosis not present

## 2021-07-29 DIAGNOSIS — K430 Incisional hernia with obstruction, without gangrene: Secondary | ICD-10-CM | POA: Diagnosis not present

## 2021-07-29 DIAGNOSIS — Z9989 Dependence on other enabling machines and devices: Secondary | ICD-10-CM | POA: Diagnosis not present

## 2021-07-29 HISTORY — PX: VENTRAL HERNIA REPAIR: SHX424

## 2021-07-29 SURGERY — REPAIR, HERNIA, VENTRAL, LAPAROSCOPIC
Anesthesia: General | Site: Abdomen

## 2021-07-29 MED ORDER — HYDROMORPHONE HCL 1 MG/ML IJ SOLN
0.2500 mg | INTRAMUSCULAR | Status: DC | PRN
  Administered 2021-07-29: 0.5 mg via INTRAVENOUS

## 2021-07-29 MED ORDER — HYDROMORPHONE HCL 1 MG/ML IJ SOLN
INTRAMUSCULAR | Status: AC
  Administered 2021-07-29: 0.5 mg via INTRAVENOUS
  Filled 2021-07-29: qty 1

## 2021-07-29 MED ORDER — KETAMINE HCL 10 MG/ML IJ SOLN
INTRAMUSCULAR | Status: DC | PRN
  Administered 2021-07-29: 20 mg via INTRAVENOUS

## 2021-07-29 MED ORDER — BUPIVACAINE-EPINEPHRINE (PF) 0.25% -1:200000 IJ SOLN
INTRAMUSCULAR | Status: AC
  Filled 2021-07-29: qty 30

## 2021-07-29 MED ORDER — CHLORHEXIDINE GLUCONATE CLOTH 2 % EX PADS: 6.0000 | MEDICATED_PAD | Freq: Once | CUTANEOUS | Status: DC

## 2021-07-29 MED ORDER — KETAMINE HCL 10 MG/ML IJ SOLN
INTRAMUSCULAR | Status: AC
  Filled 2021-07-29: qty 1

## 2021-07-29 MED ORDER — MIDAZOLAM HCL 2 MG/2ML IJ SOLN
INTRAMUSCULAR | Status: AC
  Filled 2021-07-29: qty 2

## 2021-07-29 MED ORDER — PROPOFOL 10 MG/ML IV BOLUS
INTRAVENOUS | Status: AC
  Filled 2021-07-29: qty 20

## 2021-07-29 MED ORDER — PROPOFOL 10 MG/ML IV BOLUS
INTRAVENOUS | Status: DC | PRN
  Administered 2021-07-29: 150 mg via INTRAVENOUS

## 2021-07-29 MED ORDER — BUPIVACAINE HCL (PF) 0.25 % IJ SOLN
INTRAMUSCULAR | Status: DC | PRN
  Administered 2021-07-29: 60 mL

## 2021-07-29 MED ORDER — HYDROMORPHONE HCL 1 MG/ML IJ SOLN
0.2500 mg | INTRAMUSCULAR | Status: DC | PRN
  Administered 2021-07-29: 0.25 mg via INTRAVENOUS

## 2021-07-29 MED ORDER — LIDOCAINE HCL 2 % IJ SOLN
INTRAMUSCULAR | Status: AC
  Filled 2021-07-29: qty 20

## 2021-07-29 MED ORDER — ROCURONIUM BROMIDE 10 MG/ML (PF) SYRINGE
PREFILLED_SYRINGE | INTRAVENOUS | Status: AC
  Filled 2021-07-29: qty 10

## 2021-07-29 MED ORDER — ENSURE PRE-SURGERY PO LIQD
296.0000 mL | Freq: Once | ORAL | Status: DC
  Filled 2021-07-29: qty 296

## 2021-07-29 MED ORDER — CHLORHEXIDINE GLUCONATE 0.12 % MT SOLN
15.0000 mL | Freq: Once | OROMUCOSAL | Status: AC
  Administered 2021-07-29: 15 mL via OROMUCOSAL

## 2021-07-29 MED ORDER — LIDOCAINE 2% (20 MG/ML) 5 ML SYRINGE
INTRAMUSCULAR | Status: DC | PRN
  Administered 2021-07-29: 50 mg via INTRAVENOUS

## 2021-07-29 MED ORDER — PHENYLEPHRINE 40 MCG/ML (10ML) SYRINGE FOR IV PUSH (FOR BLOOD PRESSURE SUPPORT)
PREFILLED_SYRINGE | INTRAVENOUS | Status: DC | PRN
  Administered 2021-07-29: 80 ug via INTRAVENOUS

## 2021-07-29 MED ORDER — KETOROLAC TROMETHAMINE 30 MG/ML IJ SOLN
30.0000 mg | Freq: Once | INTRAMUSCULAR | Status: AC | PRN
  Administered 2021-07-29: 30 mg via INTRAVENOUS

## 2021-07-29 MED ORDER — ONDANSETRON HCL 4 MG/2ML IJ SOLN: 4.0000 mg | Freq: Once | INTRAMUSCULAR | Status: DC | PRN

## 2021-07-29 MED ORDER — OXYCODONE HCL 5 MG PO TABS
10.0000 mg | ORAL_TABLET | Freq: Four times a day (QID) | ORAL | 0 refills | Status: DC | PRN
  Filled 2021-07-29: qty 10, 2d supply, fill #0

## 2021-07-29 MED ORDER — OXYCODONE HCL 5 MG PO TABS: 5.0000 mg | ORAL_TABLET | Freq: Once | ORAL | Status: DC | PRN

## 2021-07-29 MED ORDER — DEXAMETHASONE SODIUM PHOSPHATE 10 MG/ML IJ SOLN
INTRAMUSCULAR | Status: DC | PRN
Start: 2021-07-29 — End: 2021-07-29
  Administered 2021-07-29: 10 mg via INTRAVENOUS

## 2021-07-29 MED ORDER — OXYCODONE HCL 5 MG/5ML PO SOLN: 5.0000 mg | Freq: Once | ORAL | Status: DC | PRN

## 2021-07-29 MED ORDER — SUGAMMADEX SODIUM 200 MG/2ML IV SOLN
INTRAVENOUS | Status: DC | PRN
  Administered 2021-07-29: 200 mg via INTRAVENOUS

## 2021-07-29 MED ORDER — 0.9 % SODIUM CHLORIDE (POUR BTL) OPTIME
TOPICAL | Status: DC | PRN
  Administered 2021-07-29: 1000 mL

## 2021-07-29 MED ORDER — ONDANSETRON HCL 4 MG/2ML IJ SOLN
INTRAMUSCULAR | Status: AC
  Filled 2021-07-29: qty 2

## 2021-07-29 MED ORDER — KETOROLAC TROMETHAMINE 30 MG/ML IJ SOLN
INTRAMUSCULAR | Status: AC
  Filled 2021-07-29: qty 1

## 2021-07-29 MED ORDER — CEFAZOLIN SODIUM-DEXTROSE 2-4 GM/100ML-% IV SOLN
2.0000 g | INTRAVENOUS | Status: AC
  Administered 2021-07-29: 2 g via INTRAVENOUS
  Filled 2021-07-29: qty 100

## 2021-07-29 MED ORDER — ONDANSETRON HCL 4 MG/2ML IJ SOLN
INTRAMUSCULAR | Status: DC | PRN
  Administered 2021-07-29: 4 mg via INTRAVENOUS

## 2021-07-29 MED ORDER — MIDAZOLAM HCL 5 MG/5ML IJ SOLN
INTRAMUSCULAR | Status: DC | PRN
  Administered 2021-07-29: 2 mg via INTRAVENOUS

## 2021-07-29 MED ORDER — ACETAMINOPHEN 500 MG PO TABS
1000.0000 mg | ORAL_TABLET | ORAL | Status: AC
  Administered 2021-07-29: 1000 mg via ORAL
  Filled 2021-07-29: qty 2

## 2021-07-29 MED ORDER — PROMETHAZINE HCL 25 MG/ML IJ SOLN: 6.2500 mg | INTRAMUSCULAR | Status: DC | PRN

## 2021-07-29 MED ORDER — FENTANYL CITRATE (PF) 100 MCG/2ML IJ SOLN
INTRAMUSCULAR | Status: AC
  Filled 2021-07-29: qty 2

## 2021-07-29 MED ORDER — LACTATED RINGERS IV SOLN: INTRAVENOUS | Status: DC

## 2021-07-29 MED ORDER — DEXAMETHASONE SODIUM PHOSPHATE 10 MG/ML IJ SOLN
INTRAMUSCULAR | Status: AC
  Filled 2021-07-29: qty 1

## 2021-07-29 MED ORDER — BUPIVACAINE-EPINEPHRINE 0.25% -1:200000 IJ SOLN
INTRAMUSCULAR | Status: DC | PRN
  Administered 2021-07-29: 5 mL

## 2021-07-29 MED ORDER — FENTANYL CITRATE (PF) 100 MCG/2ML IJ SOLN
INTRAMUSCULAR | Status: DC | PRN
  Administered 2021-07-29 (×2): 50 ug via INTRAVENOUS

## 2021-07-29 MED ORDER — ORAL CARE MOUTH RINSE: 15.0000 mL | Freq: Once | OROMUCOSAL | Status: AC

## 2021-07-29 SURGICAL SUPPLY — 47 items
BAG COUNTER SPONGE SURGICOUNT (BAG) IMPLANT
BINDER ABDOMINAL 12 ML 46-62 (SOFTGOODS) IMPLANT
BLADE SURG SZ11 CARB STEEL (BLADE) ×2 IMPLANT
CABLE HIGH FREQUENCY MONO STRZ (ELECTRODE) ×2 IMPLANT
CLSR STERI-STRIP ANTIMIC 1/2X4 (GAUZE/BANDAGES/DRESSINGS) ×2 IMPLANT
COVER SURGICAL LIGHT HANDLE (MISCELLANEOUS) ×2 IMPLANT
DECANTER SPIKE VIAL GLASS SM (MISCELLANEOUS) ×2 IMPLANT
DERMABOND ADVANCED (GAUZE/BANDAGES/DRESSINGS) ×1
DERMABOND ADVANCED .7 DNX12 (GAUZE/BANDAGES/DRESSINGS) ×1 IMPLANT
DEVICE SECURE STRAP 25 ABSORB (INSTRUMENTS) ×2 IMPLANT
DEVICE TROCAR PUNCTURE CLOSURE (ENDOMECHANICALS) IMPLANT
DRAPE INCISE IOBAN 66X45 STRL (DRAPES) ×2 IMPLANT
ELECT REM PT RETURN 15FT ADLT (MISCELLANEOUS) ×2 IMPLANT
GLOVE SURG ENC MOIS LTX SZ7 (GLOVE) ×2 IMPLANT
GLOVE SURG UNDER POLY LF SZ7 (GLOVE) ×2 IMPLANT
GLOVE SURG UNDER POLY LF SZ7.5 (GLOVE) ×2 IMPLANT
GOWN STRL REUS W/ TWL XL LVL3 (GOWN DISPOSABLE) ×1 IMPLANT
GOWN STRL REUS W/TWL LRG LVL3 (GOWN DISPOSABLE) ×4 IMPLANT
GOWN STRL REUS W/TWL XL LVL3 (GOWN DISPOSABLE) ×4 IMPLANT
IRRIG SUCT STRYKERFLOW 2 WTIP (MISCELLANEOUS)
IRRIGATION SUCT STRKRFLW 2 WTP (MISCELLANEOUS) IMPLANT
KIT BASIN OR (CUSTOM PROCEDURE TRAY) ×2 IMPLANT
MARKER SKIN DUAL TIP RULER LAB (MISCELLANEOUS) ×2 IMPLANT
MESH VENTRALEX ST 8CM LRG (Mesh General) ×2 IMPLANT
NEEDLE SPNL 22GX3.5 QUINCKE BK (NEEDLE) ×2 IMPLANT
NS IRRIG 1000ML POUR BTL (IV SOLUTION) ×2 IMPLANT
PAD POSITIONING PINK XL (MISCELLANEOUS) IMPLANT
PENCIL SMOKE EVACUATOR (MISCELLANEOUS) IMPLANT
PROTECTOR NERVE ULNAR (MISCELLANEOUS) IMPLANT
SCISSORS LAP 5X35 DISP (ENDOMECHANICALS) ×2 IMPLANT
SET TUBE SMOKE EVAC HIGH FLOW (TUBING) ×2 IMPLANT
SOL ANTI FOG 6CC (MISCELLANEOUS) ×1 IMPLANT
SOLUTION ANTI FOG 6CC (MISCELLANEOUS) ×1
STAPLER VISISTAT 35W (STAPLE) ×2 IMPLANT
STRIP CLOSURE SKIN 1/2X4 (GAUZE/BANDAGES/DRESSINGS) IMPLANT
SUT MNCRL AB 4-0 PS2 18 (SUTURE) ×2 IMPLANT
SUT PROLENE 0 CT 1 CR/8 (SUTURE) IMPLANT
SUT PROLENE 2 0 SH DA (SUTURE) IMPLANT
TACKER 5MM HERNIA 3.5CML NAB (ENDOMECHANICALS) ×2 IMPLANT
TAPE CLOTH 4X10 WHT NS (GAUZE/BANDAGES/DRESSINGS) IMPLANT
TOWEL OR 17X26 10 PK STRL BLUE (TOWEL DISPOSABLE) ×2 IMPLANT
TOWEL OR NON WOVEN STRL DISP B (DISPOSABLE) ×2 IMPLANT
TRAY FOLEY MTR SLVR 14FR STAT (SET/KITS/TRAYS/PACK) IMPLANT
TRAY FOLEY MTR SLVR 16FR STAT (SET/KITS/TRAYS/PACK) IMPLANT
TRAY LAPAROSCOPIC (CUSTOM PROCEDURE TRAY) ×2 IMPLANT
TROCAR BALLN 12MMX100 BLUNT (TROCAR) IMPLANT
TROCAR XCEL NON-BLD 11X100MML (ENDOMECHANICALS) ×2 IMPLANT

## 2021-07-29 NOTE — Anesthesia Procedure Notes (Signed)
Anesthesia Procedure Image    

## 2021-07-29 NOTE — Op Note (Signed)
Preoperative diagnosis: Incisional hernia Postoperative diagnosis: Same as above Procedure: Laparoscopic assisted mesh repair of incisional hernia Surgeon: Dr. Serita Grammes Anesthesia: General with bilateral tap block Estimated blood loss: Minimal Specimens: None Complications: None Drains: None Sponge and count was correct completion Disposition to recovery stable condition  Indications: This is a 70 year old female with a prior hysterectomy that was done robotically as well as an appendectomy.  She has an abdominal wall bulge superior to umbilicus this is gotten bigger and causing her discomfort.  She had a large diastases that appeared to also have a small hernia in it.  I sent her for CT scan that showed the diastases as well as a new small midline epigastric ventral hernia with omental fat and a stable tiny umbilical hernia.  We discussed laparoscopic repair with mesh.  Procedure: After informed consent was obtained the patient was taken to the operating.  She underwent a bilateral tap block.  She was given antibiotics.  SCDs were in place.  She was then placed under general anesthesia without complication.  She was prepped and draped in the standard sterile surgical fashion.  A surgical timeout was then performed.  I infiltrated a small amount of Marcaine the left upper quadrant.  I then made an incision.  I inserted a 5 mm direct optical entry trocar into the abdomen without difficulty.  There is no evidence of an injury.  I then insufflated the abdomen to 15 mmHg pressure.  She was noted to have incarcerated omentum in the epigastric hernia.  There really was not a umbilical hernia that I can actually visualize.  I assume that there might have been something tightly that was picked up on the CT scan but there was nothing clinically apparent to me.  I then placed a 11 mm trocar and a 5 mm trocar in the left side of the abdomen.  With a combination of sharp and blunt dissection I then reduced  the hernia in its entirety.  The hernia was only about 1.3 cm in size.  This was right next to the umbilicus when I did this as well.  I then elected to make a incision overlying the hernia.  I then remove the sac and some of the additional tissue that was incarcerated.  After that I did use a 0 Vicryl suture in the suture passer to close what ever might of been present at the umbilicus.  This was again not clinically apparent.  I did close this completely though.  I then elected to place an 8 cm Ventralex patch.  I inserted this anteriorly.  This covered the supraumbilical defect as well as the umbilicus.  I pulled this in to be good position.  I did place tacks to secure the edges of this.  This was flat when I had completed this.  I viewed this with the laparoscope.  I then anteriorly used #1 Novafil suture to completely close the defect and tacked the tags down.  Once I done this I remove my 11 mm trocar.  I used a 0 Vicryl suture to close this defect.  I then desufflated the abdomen and removed all my trochars.  I closed the midline incision with a 3-0 Vicryl and 4-0 Monocryl.  The remainder of the laparoscopic incisions were closed with 4 Monocryl.  Glue and Steri-Strips were applied.  She tolerated this well was extubated transferred to recovery stable.

## 2021-07-29 NOTE — Anesthesia Postprocedure Evaluation (Signed)
Anesthesia Post Note  Patient: Mandy Holmes  Procedure(s) Performed: LAPAROSCOPIC VENTRAL HERNIA REPAIR WITH MESH (Abdomen)     Patient location during evaluation: PACU Anesthesia Type: General Level of consciousness: awake and alert Pain management: pain level controlled Vital Signs Assessment: post-procedure vital signs reviewed and stable Respiratory status: spontaneous breathing, nonlabored ventilation, respiratory function stable and patient connected to nasal cannula oxygen Cardiovascular status: blood pressure returned to baseline and stable Postop Assessment: no apparent nausea or vomiting Anesthetic complications: no   No notable events documented.  Last Vitals:  Vitals:   07/29/21 0935 07/29/21 0953  BP:    Pulse: 68 71  Resp: 14 (!) 22  Temp:    SpO2: 99% 98%    Last Pain:  Vitals:   07/29/21 0953  TempSrc:   PainSc: 5                  Ambera Fedele S

## 2021-07-29 NOTE — Transfer of Care (Signed)
Immediate Anesthesia Transfer of Care Note  Patient: Mandy Holmes  Procedure(s) Performed: Procedure(s): LAPAROSCOPIC VENTRAL HERNIA REPAIR WITH MESH (N/A)  Patient Location: PACU  Anesthesia Type:General  Level of Consciousness: Alert, Awake, Oriented  Airway & Oxygen Therapy: Patient Spontanous Breathing  Post-op Assessment: Report given to RN  Post vital signs: Reviewed and stable  Last Vitals:  Vitals:   07/29/21 0555 07/29/21 0854  BP: (!) 147/80 133/76  Pulse: 85 65  Resp: 16 18  Temp: 36.8 C (!) 36.3 C  SpO2: 13% 685%    Complications: No apparent anesthesia complications

## 2021-07-29 NOTE — H&P (Signed)
70 y.o. female who is seen today for follow up. She has a prior hysterectomy that was done robotically as well as an appendectomy. She is noted since 2019 an abdominal wall bulge from her umbilicus superior to that. This is gotten bigger over time. This causes him occasional discomfort. Always goes down when she is lying down. She is referred here to evaluate for a possible hernia. I saw her and sent for a ct scan. This shows a diastasis, new small midline epigastric ventral hernia with omental fat and a stable tiny umbilical hernia also containing only fat. She is here with her husband.   Medical History: Past Medical History:  Diagnosis Date   Arthritis   History of cancer   Sleep apnea   Thyroid disease   Patient Active Problem List  Diagnosis   Class 1 obesity due to excess calories without serious comorbidity with body mass index (BMI) of 31.0 to 31.9 in adult   Adenocarcinoma of endometrium (CMS-HCC)   Hypothyroidism   OSA on CPAP   Past Surgical History:  Procedure Laterality Date   APPENDECTOMY   HYSTERECTOMY   KNEE ARTHROSCOPY Left    No Known Allergies  Current Outpatient Medications on File Prior to Visit  Medication Sig Dispense Refill   ascorbic acid, vitamin C, (VITAMIN C) 1000 MG tablet Take by mouth   baclofen (LIORESAL) 10 MG tablet baclofen 10 mg tablet TAKE 1/2 TO 1 TABLET BY MOUTH 2 TIMES A DAY AS NEEDED FOR MUSCLE SPASMS   cholecalciferol, vitamin D3, (VITAMIN D3) 125 mcg (5,000 unit) tablet Take by mouth   estradioL (ESTRACE) 0.01 % (0.1 mg/gram) vaginal cream estradiol 0.01% (0.1 mg/gram) vaginal cream INSERT 1 GRAM INTO THE VAGINA TWICE A WEEK   metroNIDAZOLE (FLAGYL) 500 MG tablet metronidazole 500 mg tablet   thyroid (ARMOUR) 30 mg tablet Armour Thyroid 30 mg tablet   No current facility-administered medications on file prior to visit.   Family History  Problem Relation Age of Onset   Stroke Mother   Skin cancer Father   Diabetes Father    Diabetes Sister   Skin cancer Brother    Social History   Tobacco Use  Smoking Status Never Smoker  Smokeless Tobacco Never Used    Social History   Socioeconomic History   Marital status: Married  Tobacco Use   Smoking status: Never Smoker   Smokeless tobacco: Never Used  Scientific laboratory technician Use: Never used  Substance and Sexual Activity   Alcohol use: Yes   Drug use: Not Currently   Objective:    Physical Exam   Diastasis recti, small supraumbilical hernia with omental fat present nontender Uh not really palpable.   Assessment and Plan:   Incisional hernia, without obstruction or gangrene  Laparoscopic incisional hernia repair x2 with mesh  We discussed all of her options including observation, laparoscopic repair, repair combined with repair of her diastases. I think many of her symptoms are actually related to her diastases. The 2 hernias are symptomatic especially the supraumbilical one though. After a long discussion about the options we have elected to proceed with a laparoscopic ventral hernia repair with mesh. I discussed surgery, possibility of adhesions and an open repair, injury to surrounding structures requiring repair, recurrence, admission overnight after surgery. We also discussed multiple other issues that can occur after surgery as well as her recovery. I discussed this would not fix her diastases but just prevent her from having an issue with the hernias. She  is comfortable with this plan and we will proceed at the end of the month.

## 2021-07-29 NOTE — Anesthesia Procedure Notes (Signed)
Anesthesia Regional Block: TAP block   Pre-Anesthetic Checklist: , timeout performed,  Correct Patient, Correct Site, Correct Laterality,  Correct Procedure, Correct Position, site marked,  Risks and benefits discussed,  Surgical consent,  Pre-op evaluation,  At surgeon's request and post-op pain management  Laterality: Left and Right  Prep: chloraprep       Needles:  Injection technique: Single-shot  Needle Type: Echogenic Needle     Needle Length: 9cm      Additional Needles:   Procedures:,,,, ultrasound used (permanent image in chart),,    Narrative:  Start time: 07/29/2021 6:50 AM End time: 07/29/2021 6:58 AM Injection made incrementally with aspirations every 5 mL.  Performed by: Personally  Anesthesiologist: Myrtie Soman, MD  Additional Notes: Patient tolerated the procedure well without complications

## 2021-07-29 NOTE — Interval H&P Note (Signed)
History and Physical Interval Note:  07/29/2021 7:19 AM  Mandy Holmes  has presented today for surgery, with the diagnosis of VENTRAL HERNIA x2.  The various methods of treatment have been discussed with the patient and family. After consideration of risks, benefits and other options for treatment, the patient has consented to  Procedure(s): [place] (N/A) as a surgical intervention.  The patient's history has been reviewed, patient examined, no change in status, stable for surgery.  I have reviewed the patient's chart and labs.  Questions were answered to the patient's satisfaction.     Rolm Bookbinder

## 2021-07-29 NOTE — Discharge Instructions (Signed)
[facility]  UMBIL[facility] INGUINAL HERN[facility] REPAIR: POST OP INSTRUCTIONS  Always review your discharge instruction sheet given to you by the facility where your surgery was performed. IF YOU HAVE DISABILITY OR FAMILY LEAVE FORMS, YOU MUST BRING THEM TO THE OFFICE FOR PROCESSING.   DO NOT GIVE THEM TO YOUR DOCTOR.  A  prescription for pain medication may be given to you upon discharge.  Take your pain medication as prescribed, if needed.  If narcotic pain medicine is not needed, then you may take acetaminophen (Tylenol), naprosyn (Alleve) or ibuprofen (Advil) as needed. Take your usually prescribed medications unless otherwise directed. If you need a refill on your pain medication, please contact your pharmacy.  They will contact our office to request authorization. Prescriptions will not be filled after 5 pm or on week-ends. You should follow a light diet the first 24 hours after arrival home, such as soup and crackers, etc.  Be sure to include lots of fluids daily.  Resume your normal diet the day after surgery. Most patients will experience some swelling and bruising around the umbilicus or in the groin and scrotum.  Ice packs and reclining will help.  Swelling and bruising can take several days to resolve.  It is common to experience some constipation if taking pain medication after surgery.  Increasing fluid intake and taking a stool softener (such as Colace) will usually help or prevent this problem from occurring.  A mild laxative (Milk of Magnesia or Miralax) should be taken according to package directions if there are no bowel movements after 48 hours. Unless discharge instructions indicate otherwise, you may remove your bandages 48 hours after surgery, and you may shower at that time.  You may have steri-strips (small skin tapes) in place directly over the incision.  These strips should be left on the skin for 7-10 days and will come off on their own.  If your surgeon used  skin glue on the incision, you may shower in 24 hours.  The glue will flake off over the next 2-3 weeks.  Any sutures or staples will be removed at the office during your follow-up visit. ACTIVITIES:  You may resume regular (light) daily activities beginning the next day--such as daily self-care, walking, climbing stairs--gradually increasing activities as tolerated.  You may have sexual intercourse when it is comfortable.  Refrain from any heavy lifting or straining until approved by your doctor. You may drive when you are no longer taking prescription pain medication, you can comfortably wear a seatbelt, and you can safely maneuver your car and apply brakes. RETURN TO WORK:  __________________________________________________________ Dennis Bast should see your doctor in the office for a follow-up appointment approximately 2-3 weeks after your surgery.  Make sure that you call for this appointment within a day or two after you arrive home to insure a convenient appointment time. OTHER INSTRUCTIONS:  __________________________________________________________________________________________________________________________________________________________________________________________  WHEN TO CALL YOUR DOCTOR: Fever over 101.0 Inability to urinate Nausea and/or vomiting Extreme swelling or bruising Continued bleeding from incision. Increased pain, redness, or drainage from the incision  The clinic staff is available to answer your questions during regular business hours.  Please don't hesitate to call and ask to speak to one of the nurses for clinical concerns.  If you have a medical emergency, go to the nearest emergency room or call 911.  A surgeon from [facility] is always on call at the hospital   [address] [place], [place], [place]  [address] ?  [address] [address], [place], [place]   [address] [phone] ? [phone] ? FAX [phone] Web site:  [email or web address]  

## 2021-07-29 NOTE — Anesthesia Procedure Notes (Signed)
Procedure Name: Intubation Date/Time: 07/29/2021 7:31 AM Performed by: Gerald Leitz, CRNA Pre-anesthesia Checklist: Patient identified, Patient being monitored, Timeout performed, Emergency Drugs available and Suction available Patient Re-evaluated:Patient Re-evaluated prior to induction Oxygen Delivery Method: Circle system utilized Preoxygenation: Pre-oxygenation with 100% oxygen Induction Type: IV induction Ventilation: Mask ventilation without difficulty Laryngoscope Size: Mac and 3 Grade View: Grade I Tube type: Oral Tube size: 7.0 mm Number of attempts: 2 Airway Equipment and Method: Stylet Placement Confirmation: ETT inserted through vocal cords under direct vision, positive ETCO2 and breath sounds checked- equal and bilateral Secured at: 21 cm Tube secured with: Tape Dental Injury: Teeth and Oropharynx as per pre-operative assessment  Difficulty Due To: Difficult Airway- due to anterior larynx

## 2021-07-30 ENCOUNTER — Encounter (HOSPITAL_COMMUNITY): Payer: Self-pay | Admitting: General Surgery

## 2021-08-10 ENCOUNTER — Ambulatory Visit (INDEPENDENT_AMBULATORY_CARE_PROVIDER_SITE_OTHER): Payer: 59 | Admitting: Physician Assistant

## 2021-08-10 ENCOUNTER — Other Ambulatory Visit: Payer: Self-pay

## 2021-08-10 DIAGNOSIS — L570 Actinic keratosis: Secondary | ICD-10-CM

## 2021-08-10 DIAGNOSIS — Z1283 Encounter for screening for malignant neoplasm of skin: Secondary | ICD-10-CM | POA: Diagnosis not present

## 2021-08-10 DIAGNOSIS — L821 Other seborrheic keratosis: Secondary | ICD-10-CM | POA: Diagnosis not present

## 2021-08-10 DIAGNOSIS — I872 Venous insufficiency (chronic) (peripheral): Secondary | ICD-10-CM | POA: Diagnosis not present

## 2021-08-10 DIAGNOSIS — C44319 Basal cell carcinoma of skin of other parts of face: Secondary | ICD-10-CM | POA: Diagnosis not present

## 2021-08-10 DIAGNOSIS — Z85828 Personal history of other malignant neoplasm of skin: Secondary | ICD-10-CM | POA: Diagnosis not present

## 2021-08-10 DIAGNOSIS — D0471 Carcinoma in situ of skin of right lower limb, including hip: Secondary | ICD-10-CM

## 2021-08-10 DIAGNOSIS — D045 Carcinoma in situ of skin of trunk: Secondary | ICD-10-CM

## 2021-08-10 DIAGNOSIS — D485 Neoplasm of uncertain behavior of skin: Secondary | ICD-10-CM

## 2021-08-10 NOTE — Patient Instructions (Signed)

## 2021-08-11 ENCOUNTER — Encounter: Payer: Self-pay | Admitting: Physician Assistant

## 2021-08-11 NOTE — Progress Notes (Signed)
Follow-Up Visit   Subjective  Mandy Holmes is a 70 y.o. female who presents for the following: Annual Exam (Here for annual skin exam.  Patient does have lesion on chest. Red lesion on right thigh. Patient just had hernia surgery on  the 20th. /History of non mole skin cancer. ).  She also has a history of venous insufficiency with 3 pink crusts on her left shin. Per patient all lesions are stable.   The following portions of the chart were reviewed this encounter and updated as appropriate:  Tobacco  Allergies  Meds  Problems  Med Hx  Surg Hx  Fam Hx      Objective  Well appearing patient in no apparent distress; mood and affect are within normal limits.  A full examination was performed including scalp, head, eyes, ears, nose, lips, neck, chest, axillae, abdomen, back, buttocks, bilateral upper extremities, bilateral lower extremities, hands, feet, fingers, toes, fingernails, and toenails. All findings within normal limits unless otherwise noted below.  Mid Back Stuck-on, waxy papules and plaques.   Chest - Medial [facility]) [place] on a pink base       Right Thigh - Anterior Thick crusty pink plaque.       Right Forehead Hyperkeratotic scale with pink base      Mid Forehead, Right Forehead Erythematous patches with gritty scale.   Assessment & Plan  Seborrheic keratosis Mid Back  Benign no treatment if stable   Neoplasm of uncertain behavior of skin (3) Chest - Medial (Center)  Skin / nail biopsy Type of biopsy: tangential   Informed consent: discussed and consent obtained   Timeout: patient name, date of birth, surgical site, and procedure verified   Anesthesia: the lesion was anesthetized in a standard fashion   Anesthetic:  1% lidocaine w/ epinephrine 1-100,000 local infiltration Instrument used: flexible razor blade   Hemostasis achieved with: ferric subsulfate   Outcome: patient tolerated procedure well   Post-procedure details: wound  care instructions given    Specimen 1 - Surgical pathology Differential Diagnosis: scc vs bcc  Check Margins: No  Right Thigh - Anterior  Skin / nail biopsy Type of biopsy: tangential   Informed consent: discussed and consent obtained   Timeout: patient name, date of birth, surgical site, and procedure verified   Anesthesia: the lesion was anesthetized in a standard fashion   Anesthetic:  1% lidocaine w/ epinephrine 1-100,000 local infiltration Instrument used: flexible razor blade   Hemostasis achieved with: ferric subsulfate   Outcome: patient tolerated procedure well   Post-procedure details: wound care instructions given    Specimen 2 - Surgical pathology Differential Diagnosis: scc vs bcc  Check Margins: No  Right Forehead  Skin / nail biopsy Type of biopsy: tangential   Informed consent: discussed and consent obtained   Timeout: patient name, date of birth, surgical site, and procedure verified   Anesthesia: the lesion was anesthetized in a standard fashion   Anesthetic:  1% lidocaine w/ epinephrine 1-100,000 local infiltration Instrument used: flexible razor blade   Hemostasis achieved with: ferric subsulfate   Outcome: patient tolerated procedure well   Post-procedure details: wound care instructions given    Specimen 3 - Surgical pathology Differential Diagnosis: scc vs bcc  Check Margins: No  Venous insufficiency (3) Right Thigh - Anterior; Left Lower Leg - Anterior; Right Lower Leg - Anterior  AK (actinic keratosis) (2) Mid Forehead; Right Forehead  Destruction of lesion - Mid Forehead, Right Forehead Complexity: simple   Destruction  method: cryotherapy   Informed consent: discussed and consent obtained   Timeout:  patient name, date of birth, surgical site, and procedure verified Lesion destroyed using liquid nitrogen: Yes   Cryotherapy cycles:  1 Outcome: patient tolerated procedure well with no complications   Post-procedure details: wound care  instructions given    We discussed the potential for non healing with her venous insufficiency. Patient was agreeable to watch the lesions and return if any of them start to grow or become painful.  I, Prisha Hiley, PA-C, have reviewed all documentation's for this visit.  The documentation on 08/11/21 for the exam, diagnosis, procedures and orders are all accurate and complete.

## 2021-08-16 ENCOUNTER — Telehealth: Payer: Self-pay | Admitting: *Deleted

## 2021-08-16 NOTE — Telephone Encounter (Signed)
Path to patient. Will schedule mohs for her forehead and then once that is treated we will treat the other lesions.

## 2021-09-13 ENCOUNTER — Other Ambulatory Visit (HOSPITAL_COMMUNITY): Payer: Self-pay

## 2021-09-23 ENCOUNTER — Encounter: Payer: Self-pay | Admitting: Internal Medicine

## 2021-12-02 DIAGNOSIS — C44319 Basal cell carcinoma of skin of other parts of face: Secondary | ICD-10-CM | POA: Diagnosis not present

## 2022-02-23 ENCOUNTER — Other Ambulatory Visit: Payer: Self-pay | Admitting: Family Medicine

## 2022-02-23 DIAGNOSIS — R2242 Localized swelling, mass and lump, left lower limb: Secondary | ICD-10-CM

## 2022-03-06 ENCOUNTER — Ambulatory Visit
Admission: RE | Admit: 2022-03-06 | Discharge: 2022-03-06 | Disposition: A | Discharge status: Home / Self Care | Payer: PPO | Source: Ambulatory Visit | Attending: Family Medicine | Admitting: Family Medicine

## 2022-03-06 DIAGNOSIS — R2242 Localized swelling, mass and lump, left lower limb: Secondary | ICD-10-CM | POA: Diagnosis not present

## 2022-03-06 MED ORDER — GADOBENATE DIMEGLUMINE 529 MG/ML IV SOLN
15.0000 mL | Freq: Once | INTRAVENOUS | Status: AC | PRN
  Administered 2022-03-06: 15 mL via INTRAVENOUS

## 2022-03-09 ENCOUNTER — Other Ambulatory Visit (HOSPITAL_COMMUNITY): Payer: Self-pay | Admitting: Family Medicine

## 2022-03-09 ENCOUNTER — Other Ambulatory Visit: Payer: Self-pay | Admitting: Family Medicine

## 2022-03-09 DIAGNOSIS — R2242 Localized swelling, mass and lump, left lower limb: Secondary | ICD-10-CM

## 2022-03-22 ENCOUNTER — Other Ambulatory Visit (HOSPITAL_COMMUNITY): Payer: Self-pay

## 2022-03-22 ENCOUNTER — Ambulatory Visit: Payer: PPO | Admitting: Physician Assistant

## 2022-03-22 ENCOUNTER — Encounter: Payer: Self-pay | Admitting: Physician Assistant

## 2022-03-22 DIAGNOSIS — D044 Carcinoma in situ of skin of scalp and neck: Secondary | ICD-10-CM | POA: Diagnosis not present

## 2022-03-22 DIAGNOSIS — C44719 Basal cell carcinoma of skin of left lower limb, including hip: Secondary | ICD-10-CM

## 2022-03-22 DIAGNOSIS — Z85828 Personal history of other malignant neoplasm of skin: Secondary | ICD-10-CM

## 2022-03-22 DIAGNOSIS — L570 Actinic keratosis: Secondary | ICD-10-CM

## 2022-03-22 DIAGNOSIS — L910 Hypertrophic scar: Secondary | ICD-10-CM

## 2022-03-22 DIAGNOSIS — Z1283 Encounter for screening for malignant neoplasm of skin: Secondary | ICD-10-CM

## 2022-03-22 DIAGNOSIS — C44729 Squamous cell carcinoma of skin of left lower limb, including hip: Secondary | ICD-10-CM | POA: Diagnosis not present

## 2022-03-22 DIAGNOSIS — D485 Neoplasm of uncertain behavior of skin: Secondary | ICD-10-CM

## 2022-03-22 MED ORDER — HYDROCORTISONE 2.5 % EX OINT
TOPICAL_OINTMENT | Freq: Two times a day (BID) | CUTANEOUS | 3 refills | Status: AC
  Filled 2022-03-22: qty 28.35, 15d supply, fill #0

## 2022-03-22 NOTE — Patient Instructions (Signed)

## 2022-03-24 ENCOUNTER — Encounter: Payer: Self-pay | Admitting: Physician Assistant

## 2022-03-24 NOTE — Progress Notes (Unsigned)
Follow-Up Visit   Subjective  Mandy Holmes is a 71 y.o. female who presents for the following: Follow-up (Patient here today for [facility] on her right forehead. 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 Patient states that she does have a few lesions she would like checked on her left lower leg x years no bleeding. ). Left shin has some significant fast growing lesions. Will defer treatment of chest and thigh for 3 months as they are 90% clear today.    The following portions of the chart were reviewed this encounter and updated as appropriate:  Tobacco  Allergies  Meds  Problems  Med Hx  Surg Hx  Fam Hx      Objective  Well appearing patient in no apparent distress; mood and affect are within normal limits.  A focused examination was performed including chest, face, legs. Relevant physical exam findings are noted in the Assessment and Plan.  Right Forehead Hypertrophic scar with enlarged suture openings. No open wound.  Left Lower Leg Superior Hyperkeratotic scale with pink base        Left Lower Leg Medial Hyperkeratotic horn on an erythematous base.        Left Lower Leg Inferior Thin pink scale       Right Anterior Neck Hyperkeratotic scale with pink base         Assessment & Plan  Hypertrophic scar Right Forehead  hydrocortisone 2.5 % ointment - Right Forehead Apply topically 2 (two) times daily.  Neoplasm of uncertain behavior of skin (4) Left Lower Leg Superior  Skin / nail biopsy Type of biopsy: tangential   Informed consent: discussed and consent obtained   Timeout: patient name, date of birth, surgical site, and procedure verified   Anesthesia: the lesion was anesthetized in a standard fashion   Anesthetic:  1% lidocaine w/ epinephrine 1-100,000 local infiltration Instrument used: flexible razor blade   Hemostasis achieved with: aluminum chloride and electrodesiccation   Outcome: patient  tolerated procedure well   Post-procedure details: wound care instructions given    Destruction of lesion Complexity: simple   Destruction method: electrodesiccation and curettage   Informed consent: discussed and consent obtained   Timeout:  patient name, date of birth, surgical site, and procedure verified Anesthesia: the lesion was anesthetized in a standard fashion   Anesthetic:  1% lidocaine w/ epinephrine 1-100,000 local infiltration Curettage performed in three different directions: Yes   Electrodesiccation performed over the curetted area: Yes   Curettage cycles:  3 Margin per side (cm):  0.1 Final wound size (cm):  2.2 Hemostasis achieved with:  aluminum chloride Outcome: patient tolerated procedure well with no complications   Post-procedure details: sterile dressing applied and wound care instructions given   Dressing type: pressure dressing and petrolatum    Specimen 1 - Surgical pathology Differential Diagnosis: R/O BCC vs SCC - treated after biopsy  Check Margins: Yes  Left Lower Leg Medial  Skin / nail biopsy Type of biopsy: tangential   Informed consent: discussed and consent obtained   Timeout: patient name, date of birth, surgical site, and procedure verified   Anesthesia: the lesion was anesthetized in a standard fashion   Anesthetic:  1% lidocaine w/ epinephrine 1-100,000 local infiltration Instrument used: flexible razor blade   Hemostasis achieved with: aluminum chloride and electrodesiccation   Outcome: patient tolerated procedure well   Post-procedure details: sterile dressing applied and wound care instructions given   Dressing type:  pressure dressing and petrolatum    Destruction of lesion Complexity: simple   Destruction method: electrodesiccation and curettage   Informed consent: discussed and consent obtained   Timeout:  patient name, date of birth, surgical site, and procedure verified Anesthesia: the lesion was anesthetized in a standard  fashion   Anesthetic:  1% lidocaine w/ epinephrine 1-100,000 local infiltration Curettage performed in three different directions: Yes   Electrodesiccation performed over the curetted area: Yes   Curettage cycles:  3 Margin per side (cm):  0.1 Final wound size (cm):  1.4 Hemostasis achieved with:  aluminum chloride Outcome: patient tolerated procedure well with no complications   Post-procedure details: sterile dressing applied and wound care instructions given   Dressing type: petrolatum and pressure dressing    Specimen 2 - Surgical pathology Differential Diagnosis: R/O BCC vs SCC - treated after biopsy  Check Margins: Yes  Left Lower Leg Inferior  Skin / nail biopsy Type of biopsy: tangential   Informed consent: discussed and consent obtained   Timeout: patient name, date of birth, surgical site, and procedure verified   Anesthesia: the lesion was anesthetized in a standard fashion   Anesthetic:  1% lidocaine w/ epinephrine 1-100,000 local infiltration Instrument used: flexible razor blade   Hemostasis achieved with: aluminum chloride and electrodesiccation   Outcome: patient tolerated procedure well   Post-procedure details: wound care instructions given    Destruction of lesion Complexity: simple   Destruction method: electrodesiccation and curettage   Informed consent: discussed and consent obtained   Timeout:  patient name, date of birth, surgical site, and procedure verified Anesthesia: the lesion was anesthetized in a standard fashion   Anesthetic:  1% lidocaine w/ epinephrine 1-100,000 local infiltration Curettage performed in three different directions: Yes   Electrodesiccation performed over the curetted area: Yes   Curettage cycles:  3 Margin per side (cm):  0.1 Final wound size (cm):  2.4 Hemostasis achieved with:  aluminum chloride Outcome: patient tolerated procedure well with no complications   Post-procedure details: sterile dressing applied and wound  care instructions given   Dressing type: petrolatum and pressure dressing    Specimen 3 - Surgical pathology Differential Diagnosis: R/O BCC vs SCC - treated after biopsy  Check Margins: Yes  Right Anterior Neck  Skin / nail biopsy Type of biopsy: tangential   Informed consent: discussed and consent obtained   Timeout: patient name, date of birth, surgical site, and procedure verified   Anesthesia: the lesion was anesthetized in a standard fashion   Anesthetic:  1% lidocaine w/ epinephrine 1-100,000 local infiltration Instrument used: flexible razor blade   Hemostasis achieved with: aluminum chloride and electrodesiccation   Outcome: patient tolerated procedure well   Post-procedure details: wound care instructions given    Destruction of lesion Complexity: simple   Destruction method: electrodesiccation and curettage   Informed consent: discussed and consent obtained   Timeout:  patient name, date of birth, surgical site, and procedure verified Anesthesia: the lesion was anesthetized in a standard fashion   Anesthetic:  1% lidocaine w/ epinephrine 1-100,000 local infiltration Curettage performed in three different directions: Yes   Electrodesiccation performed over the curetted area: Yes   Curettage cycles:  3 Margin per side (cm):  0.1 Final wound size (cm):  2.6 Hemostasis achieved with:  aluminum chloride Outcome: patient tolerated procedure well with no complications   Post-procedure details: sterile dressing applied and wound care instructions given   Dressing type: petrolatum and bandage    Specimen 4 -  Surgical pathology Differential Diagnosis: R/O BCC vs SCC - treated after biopsy  Check Margins: Yes    I, Steel Kerney, PA-C, have reviewed all documentation's for this visit.  The documentation on 03/24/22 for the exam, diagnosis, procedures and orders are all accurate and complete.

## 2022-03-28 ENCOUNTER — Telehealth: Payer: Self-pay | Admitting: *Deleted

## 2022-03-28 ENCOUNTER — Other Ambulatory Visit (HOSPITAL_COMMUNITY): Payer: Self-pay

## 2022-03-28 MED ORDER — DOXYCYCLINE HYCLATE 100 MG PO CAPS
100.0000 mg | ORAL_CAPSULE | Freq: Two times a day (BID) | ORAL | 0 refills | Status: DC
Start: 2022-03-28 — End: 2022-05-31
  Filled 2022-03-28: qty 28, 14d supply, fill #0

## 2022-03-28 NOTE — Progress Notes (Unsigned)
Mandy Keen, MD  Donita Brooks D Intramuscular lipoma, low yield for bx.   Agree with f/u imaging at 6 months   TS

## 2022-03-28 NOTE — Telephone Encounter (Signed)
Patient left message stating 2 out of the 3 spots on her leg are red and inflamed. Verbal order per kelli sheffield to send in doxycycline '100mg'$  bid x 14 days. Patient notified.

## 2022-05-31 ENCOUNTER — Other Ambulatory Visit (HOSPITAL_COMMUNITY): Payer: Self-pay

## 2022-05-31 MED ORDER — DOXYCYCLINE HYCLATE 100 MG PO TBEC
100.0000 mg | DELAYED_RELEASE_TABLET | Freq: Two times a day (BID) | ORAL | 0 refills | Status: AC
  Filled 2022-05-31: qty 60, 30d supply, fill #0

## 2022-05-31 MED ORDER — DOXYCYCLINE HYCLATE 100 MG PO CAPS
100.0000 mg | ORAL_CAPSULE | Freq: Two times a day (BID) | ORAL | 0 refills | Status: AC
  Filled 2022-05-31: qty 60, 30d supply, fill #0

## 2022-06-14 ENCOUNTER — Encounter: Payer: PPO | Admitting: Physician Assistant

## 2022-06-14 ENCOUNTER — Other Ambulatory Visit (HOSPITAL_COMMUNITY): Payer: Self-pay

## 2022-06-14 DIAGNOSIS — D2362 Other benign neoplasm of skin of left upper limb, including shoulder: Secondary | ICD-10-CM | POA: Diagnosis not present

## 2022-06-14 DIAGNOSIS — D485 Neoplasm of uncertain behavior of skin: Secondary | ICD-10-CM | POA: Diagnosis not present

## 2022-06-14 DIAGNOSIS — C44619 Basal cell carcinoma of skin of left upper limb, including shoulder: Secondary | ICD-10-CM | POA: Diagnosis not present

## 2022-06-14 DIAGNOSIS — D0471 Carcinoma in situ of skin of right lower limb, including hip: Secondary | ICD-10-CM | POA: Diagnosis not present

## 2022-06-14 DIAGNOSIS — L821 Other seborrheic keratosis: Secondary | ICD-10-CM | POA: Diagnosis not present

## 2022-06-14 DIAGNOSIS — D1801 Hemangioma of skin and subcutaneous tissue: Secondary | ICD-10-CM | POA: Diagnosis not present

## 2022-06-14 DIAGNOSIS — D045 Carcinoma in situ of skin of trunk: Secondary | ICD-10-CM | POA: Diagnosis not present

## 2022-06-14 DIAGNOSIS — L814 Other melanin hyperpigmentation: Secondary | ICD-10-CM | POA: Diagnosis not present

## 2022-06-14 MED ORDER — FLUOROURACIL 5 % EX CREA
TOPICAL_CREAM | Freq: Two times a day (BID) | CUTANEOUS | 1 refills | Status: AC
  Filled 2022-06-14 (×2): qty 40, 30d supply, fill #0

## 2022-06-15 ENCOUNTER — Other Ambulatory Visit (HOSPITAL_COMMUNITY): Payer: Self-pay

## 2022-07-13 DIAGNOSIS — C44619 Basal cell carcinoma of skin of left upper limb, including shoulder: Secondary | ICD-10-CM | POA: Diagnosis not present

## 2022-07-13 DIAGNOSIS — L989 Disorder of the skin and subcutaneous tissue, unspecified: Secondary | ICD-10-CM | POA: Diagnosis not present

## 2022-07-19 ENCOUNTER — Other Ambulatory Visit: Payer: Self-pay | Admitting: Family Medicine

## 2022-07-19 ENCOUNTER — Ambulatory Visit
Admission: RE | Admit: 2022-07-19 | Discharge: 2022-07-19 | Disposition: A | Discharge status: Home / Self Care | Payer: PPO | Source: Ambulatory Visit | Attending: Family Medicine | Admitting: Family Medicine

## 2022-07-19 DIAGNOSIS — R1011 Right upper quadrant pain: Secondary | ICD-10-CM

## 2022-07-19 DIAGNOSIS — K8020 Calculus of gallbladder without cholecystitis without obstruction: Secondary | ICD-10-CM | POA: Diagnosis not present

## 2022-07-19 DIAGNOSIS — R1110 Vomiting, unspecified: Secondary | ICD-10-CM

## 2022-07-19 DIAGNOSIS — K760 Fatty (change of) liver, not elsewhere classified: Secondary | ICD-10-CM | POA: Diagnosis not present

## 2022-09-07 ENCOUNTER — Other Ambulatory Visit: Payer: Self-pay | Admitting: Family Medicine

## 2022-09-07 DIAGNOSIS — N6320 Unspecified lump in the left breast, unspecified quadrant: Secondary | ICD-10-CM

## 2022-09-20 ENCOUNTER — Ambulatory Visit
Admission: RE | Admit: 2022-09-20 | Discharge: 2022-09-20 | Disposition: A | Discharge status: Home / Self Care | Payer: PPO | Source: Ambulatory Visit | Attending: Family Medicine | Admitting: Family Medicine

## 2022-09-20 ENCOUNTER — Ambulatory Visit
Admission: RE | Admit: 2022-09-20 | Discharge: 2022-09-20 | Disposition: A | Discharge status: Home / Self Care | Payer: No Typology Code available for payment source | Source: Ambulatory Visit | Attending: Family Medicine | Admitting: Family Medicine

## 2022-09-20 DIAGNOSIS — Z7689 Persons encountering health services in other specified circumstances: Secondary | ICD-10-CM | POA: Diagnosis not present

## 2022-09-20 DIAGNOSIS — N6320 Unspecified lump in the left breast, unspecified quadrant: Secondary | ICD-10-CM

## 2022-09-20 DIAGNOSIS — R928 Other abnormal and inconclusive findings on diagnostic imaging of breast: Secondary | ICD-10-CM | POA: Diagnosis not present

## 2022-10-17 ENCOUNTER — Other Ambulatory Visit: Payer: Self-pay | Admitting: Family Medicine

## 2022-10-17 DIAGNOSIS — R2242 Localized swelling, mass and lump, left lower limb: Secondary | ICD-10-CM

## 2022-11-10 ENCOUNTER — Other Ambulatory Visit (HOSPITAL_BASED_OUTPATIENT_CLINIC_OR_DEPARTMENT_OTHER): Payer: Self-pay

## 2022-11-10 MED ORDER — DOXYCYCLINE HYCLATE 100 MG PO CAPS
100.0000 mg | ORAL_CAPSULE | Freq: Two times a day (BID) | ORAL | 0 refills | Status: AC
  Filled 2022-11-10: qty 60, 30d supply, fill #0

## 2022-11-20 ENCOUNTER — Ambulatory Visit
Admission: RE | Admit: 2022-11-20 | Discharge: 2022-11-20 | Disposition: A | Discharge status: Home / Self Care | Payer: PPO | Source: Ambulatory Visit | Attending: Family Medicine | Admitting: Family Medicine

## 2022-11-20 DIAGNOSIS — R2242 Localized swelling, mass and lump, left lower limb: Secondary | ICD-10-CM

## 2022-11-20 DIAGNOSIS — Z7689 Persons encountering health services in other specified circumstances: Secondary | ICD-10-CM | POA: Diagnosis not present

## 2022-11-20 MED ORDER — GADOPICLENOL 0.5 MMOL/ML IV SOLN
8.0000 mL | Freq: Once | INTRAVENOUS | Status: AC | PRN
  Administered 2022-11-20: 8 mL via INTRAVENOUS

## 2022-12-13 DIAGNOSIS — L728 Other follicular cysts of the skin and subcutaneous tissue: Secondary | ICD-10-CM | POA: Diagnosis not present

## 2022-12-13 DIAGNOSIS — Z86007 Personal history of in-situ neoplasm of skin: Secondary | ICD-10-CM | POA: Diagnosis not present

## 2022-12-13 DIAGNOSIS — D1724 Benign lipomatous neoplasm of skin and subcutaneous tissue of left leg: Secondary | ICD-10-CM | POA: Diagnosis not present

## 2022-12-13 DIAGNOSIS — D1801 Hemangioma of skin and subcutaneous tissue: Secondary | ICD-10-CM | POA: Diagnosis not present

## 2022-12-13 DIAGNOSIS — Z08 Encounter for follow-up examination after completed treatment for malignant neoplasm: Secondary | ICD-10-CM | POA: Diagnosis not present

## 2022-12-13 DIAGNOSIS — L821 Other seborrheic keratosis: Secondary | ICD-10-CM | POA: Diagnosis not present

## 2022-12-13 DIAGNOSIS — Z85828 Personal history of other malignant neoplasm of skin: Secondary | ICD-10-CM | POA: Diagnosis not present

## 2022-12-13 DIAGNOSIS — L570 Actinic keratosis: Secondary | ICD-10-CM | POA: Diagnosis not present

## 2022-12-13 DIAGNOSIS — L814 Other melanin hyperpigmentation: Secondary | ICD-10-CM | POA: Diagnosis not present

## 2023-01-12 DIAGNOSIS — D1724 Benign lipomatous neoplasm of skin and subcutaneous tissue of left leg: Secondary | ICD-10-CM | POA: Diagnosis not present

## 2023-02-07 DIAGNOSIS — D1724 Benign lipomatous neoplasm of skin and subcutaneous tissue of left leg: Secondary | ICD-10-CM | POA: Diagnosis not present

## 2023-04-27 ENCOUNTER — Other Ambulatory Visit (HOSPITAL_BASED_OUTPATIENT_CLINIC_OR_DEPARTMENT_OTHER): Payer: Self-pay

## 2023-04-27 MED ORDER — HYDROCORTISONE (PERIANAL) 2.5 % EX CREA
TOPICAL_CREAM | CUTANEOUS | 6 refills | Status: AC
  Filled 2023-04-27: qty 30, 10d supply, fill #0
  Filled 2023-12-19: qty 30, 10d supply, fill #1
  Filled 2024-03-13: qty 30, 10d supply, fill #2

## 2023-06-14 DIAGNOSIS — Z01419 Encounter for gynecological examination (general) (routine) without abnormal findings: Secondary | ICD-10-CM | POA: Diagnosis not present

## 2023-06-19 DIAGNOSIS — Z789 Other specified health status: Secondary | ICD-10-CM | POA: Diagnosis not present

## 2023-06-19 DIAGNOSIS — L820 Inflamed seborrheic keratosis: Secondary | ICD-10-CM | POA: Diagnosis not present

## 2023-06-19 DIAGNOSIS — D485 Neoplasm of uncertain behavior of skin: Secondary | ICD-10-CM | POA: Diagnosis not present

## 2023-06-19 DIAGNOSIS — Z86007 Personal history of in-situ neoplasm of skin: Secondary | ICD-10-CM | POA: Diagnosis not present

## 2023-06-19 DIAGNOSIS — L814 Other melanin hyperpigmentation: Secondary | ICD-10-CM | POA: Diagnosis not present

## 2023-06-19 DIAGNOSIS — C44722 Squamous cell carcinoma of skin of right lower limb, including hip: Secondary | ICD-10-CM | POA: Diagnosis not present

## 2023-06-19 DIAGNOSIS — R208 Other disturbances of skin sensation: Secondary | ICD-10-CM | POA: Diagnosis not present

## 2023-06-19 DIAGNOSIS — L821 Other seborrheic keratosis: Secondary | ICD-10-CM | POA: Diagnosis not present

## 2023-06-19 DIAGNOSIS — Z85828 Personal history of other malignant neoplasm of skin: Secondary | ICD-10-CM | POA: Diagnosis not present

## 2023-06-19 DIAGNOSIS — Z08 Encounter for follow-up examination after completed treatment for malignant neoplasm: Secondary | ICD-10-CM | POA: Diagnosis not present

## 2023-06-19 DIAGNOSIS — D225 Melanocytic nevi of trunk: Secondary | ICD-10-CM | POA: Diagnosis not present

## 2023-06-19 DIAGNOSIS — L298 Other pruritus: Secondary | ICD-10-CM | POA: Diagnosis not present

## 2023-06-19 DIAGNOSIS — L538 Other specified erythematous conditions: Secondary | ICD-10-CM | POA: Diagnosis not present

## 2023-07-26 DIAGNOSIS — D0471 Carcinoma in situ of skin of right lower limb, including hip: Secondary | ICD-10-CM | POA: Diagnosis not present

## 2023-08-01 ENCOUNTER — Other Ambulatory Visit (HOSPITAL_BASED_OUTPATIENT_CLINIC_OR_DEPARTMENT_OTHER): Payer: Self-pay

## 2023-08-01 MED ORDER — ESTRADIOL 0.1 MG/GM VA CREA
1.0000 g | TOPICAL_CREAM | VAGINAL | 11 refills | Status: AC
  Filled 2023-08-01: qty 42.5, 30d supply, fill #0
  Filled 2023-12-06: qty 42.5, 30d supply, fill #1
  Filled 2024-06-02: qty 42.5, 30d supply, fill #2

## 2023-08-08 ENCOUNTER — Other Ambulatory Visit: Payer: Self-pay | Admitting: Family Medicine

## 2023-08-08 DIAGNOSIS — Z1231 Encounter for screening mammogram for malignant neoplasm of breast: Secondary | ICD-10-CM

## 2023-09-22 ENCOUNTER — Ambulatory Visit
Admission: RE | Admit: 2023-09-22 | Discharge: 2023-09-22 | Disposition: A | Discharge status: Home / Self Care | Payer: PPO | Source: Ambulatory Visit | Attending: Family Medicine | Admitting: Family Medicine

## 2023-09-22 DIAGNOSIS — Z1231 Encounter for screening mammogram for malignant neoplasm of breast: Secondary | ICD-10-CM

## 2023-10-12 ENCOUNTER — Other Ambulatory Visit (HOSPITAL_BASED_OUTPATIENT_CLINIC_OR_DEPARTMENT_OTHER): Payer: Self-pay

## 2023-10-12 MED ORDER — ESTRADIOL 0.1 MG/GM VA CREA
TOPICAL_CREAM | VAGINAL | 11 refills | Status: AC
  Filled 2023-10-12: qty 42.5, 30d supply, fill #0
  Filled 2024-02-03: qty 42.5, 30d supply, fill #1

## 2023-11-22 ENCOUNTER — Other Ambulatory Visit: Payer: Self-pay | Admitting: Family Medicine

## 2023-11-22 DIAGNOSIS — R2242 Localized swelling, mass and lump, left lower limb: Secondary | ICD-10-CM

## 2023-12-11 ENCOUNTER — Ambulatory Visit
Admission: RE | Admit: 2023-12-11 | Discharge: 2023-12-11 | Disposition: A | Discharge status: Home / Self Care | Payer: PPO | Source: Ambulatory Visit | Attending: Family Medicine | Admitting: Family Medicine

## 2023-12-11 DIAGNOSIS — D1724 Benign lipomatous neoplasm of skin and subcutaneous tissue of left leg: Secondary | ICD-10-CM | POA: Diagnosis not present

## 2023-12-11 DIAGNOSIS — R2242 Localized swelling, mass and lump, left lower limb: Secondary | ICD-10-CM

## 2023-12-11 MED ORDER — GADOPICLENOL 0.5 MMOL/ML IV SOLN
8.0000 mL | Freq: Once | INTRAVENOUS | Status: AC | PRN
  Administered 2023-12-11: 8 mL via INTRAVENOUS

## 2023-12-21 DIAGNOSIS — L814 Other melanin hyperpigmentation: Secondary | ICD-10-CM | POA: Diagnosis not present

## 2023-12-21 DIAGNOSIS — L538 Other specified erythematous conditions: Secondary | ICD-10-CM | POA: Diagnosis not present

## 2023-12-21 DIAGNOSIS — Z789 Other specified health status: Secondary | ICD-10-CM | POA: Diagnosis not present

## 2023-12-21 DIAGNOSIS — D485 Neoplasm of uncertain behavior of skin: Secondary | ICD-10-CM | POA: Diagnosis not present

## 2023-12-21 DIAGNOSIS — D225 Melanocytic nevi of trunk: Secondary | ICD-10-CM | POA: Diagnosis not present

## 2023-12-21 DIAGNOSIS — Z85828 Personal history of other malignant neoplasm of skin: Secondary | ICD-10-CM | POA: Diagnosis not present

## 2023-12-21 DIAGNOSIS — R208 Other disturbances of skin sensation: Secondary | ICD-10-CM | POA: Diagnosis not present

## 2023-12-21 DIAGNOSIS — L820 Inflamed seborrheic keratosis: Secondary | ICD-10-CM | POA: Diagnosis not present

## 2023-12-21 DIAGNOSIS — Z86007 Personal history of in-situ neoplasm of skin: Secondary | ICD-10-CM | POA: Diagnosis not present

## 2023-12-21 DIAGNOSIS — L570 Actinic keratosis: Secondary | ICD-10-CM | POA: Diagnosis not present

## 2023-12-21 DIAGNOSIS — Z08 Encounter for follow-up examination after completed treatment for malignant neoplasm: Secondary | ICD-10-CM | POA: Diagnosis not present

## 2023-12-21 DIAGNOSIS — C44722 Squamous cell carcinoma of skin of right lower limb, including hip: Secondary | ICD-10-CM | POA: Diagnosis not present

## 2023-12-21 DIAGNOSIS — L821 Other seborrheic keratosis: Secondary | ICD-10-CM | POA: Diagnosis not present

## 2024-01-01 ENCOUNTER — Other Ambulatory Visit (HOSPITAL_BASED_OUTPATIENT_CLINIC_OR_DEPARTMENT_OTHER): Payer: Self-pay

## 2024-01-01 MED ORDER — MECLIZINE HCL 25 MG PO TABS
12.5000 mg | ORAL_TABLET | ORAL | 3 refills | Status: AC | PRN
  Filled 2024-01-01: qty 30, 5d supply, fill #0

## 2024-01-15 DIAGNOSIS — D1724 Benign lipomatous neoplasm of skin and subcutaneous tissue of left leg: Secondary | ICD-10-CM | POA: Diagnosis not present

## 2024-01-18 ENCOUNTER — Other Ambulatory Visit (HOSPITAL_BASED_OUTPATIENT_CLINIC_OR_DEPARTMENT_OTHER): Payer: Self-pay

## 2024-01-18 MED ORDER — METHYLPREDNISOLONE 4 MG PO TBPK
ORAL_TABLET | ORAL | 0 refills | Status: AC
  Filled 2024-01-18: qty 21, 6d supply, fill #0

## 2024-01-18 MED ORDER — TRAMADOL HCL 50 MG PO TABS
50.0000 mg | ORAL_TABLET | Freq: Four times a day (QID) | ORAL | 0 refills | Status: DC | PRN
Start: 2024-01-18 — End: 2024-02-12
  Filled 2024-01-18: qty 30, 8d supply, fill #0

## 2024-01-23 ENCOUNTER — Other Ambulatory Visit (HOSPITAL_BASED_OUTPATIENT_CLINIC_OR_DEPARTMENT_OTHER): Payer: Self-pay

## 2024-01-23 ENCOUNTER — Other Ambulatory Visit: Payer: Self-pay | Admitting: Physical Medicine and Rehabilitation

## 2024-01-23 DIAGNOSIS — M5416 Radiculopathy, lumbar region: Secondary | ICD-10-CM

## 2024-01-23 MED ORDER — BACLOFEN 10 MG PO TABS
5.0000 mg | ORAL_TABLET | Freq: Three times a day (TID) | ORAL | 3 refills | Status: AC | PRN
Start: 2024-01-23 — End: ?
  Filled 2024-01-23: qty 60, 20d supply, fill #0

## 2024-01-23 NOTE — Progress Notes (Signed)
 Per Dr. Armandina Bernard, referral will come from in in chart/media tab. I put I order for Left L5-S1 intelam esi. She has had before in 2019. No MRI at this point.

## 2024-01-25 ENCOUNTER — Other Ambulatory Visit (HOSPITAL_BASED_OUTPATIENT_CLINIC_OR_DEPARTMENT_OTHER): Payer: Self-pay

## 2024-01-25 MED ORDER — HYDROCODONE-ACETAMINOPHEN 5-325 MG PO TABS
0.5000 | ORAL_TABLET | Freq: Four times a day (QID) | ORAL | 0 refills | Status: DC | PRN
  Filled 2024-01-25: qty 20, 5d supply, fill #0

## 2024-01-29 ENCOUNTER — Other Ambulatory Visit (HOSPITAL_BASED_OUTPATIENT_CLINIC_OR_DEPARTMENT_OTHER): Payer: Self-pay

## 2024-01-29 MED ORDER — METHOCARBAMOL 500 MG PO TABS
500.0000 mg | ORAL_TABLET | Freq: Four times a day (QID) | ORAL | 3 refills | Status: DC | PRN
Start: 2024-01-29 — End: 2024-03-05
  Filled 2024-01-29: qty 60, 8d supply, fill #0
  Filled 2024-02-03: qty 60, 8d supply, fill #1
  Filled 2024-02-12: qty 60, 8d supply, fill #2
  Filled 2024-02-26: qty 60, 8d supply, fill #3

## 2024-02-03 ENCOUNTER — Other Ambulatory Visit (HOSPITAL_BASED_OUTPATIENT_CLINIC_OR_DEPARTMENT_OTHER): Payer: Self-pay

## 2024-02-06 ENCOUNTER — Ambulatory Visit: Admitting: Physical Medicine and Rehabilitation

## 2024-02-06 ENCOUNTER — Other Ambulatory Visit: Payer: Self-pay

## 2024-02-06 VITALS — BP 156/86 | HR 74

## 2024-02-06 DIAGNOSIS — M48061 Spinal stenosis, lumbar region without neurogenic claudication: Secondary | ICD-10-CM

## 2024-02-06 DIAGNOSIS — M25552 Pain in left hip: Secondary | ICD-10-CM | POA: Diagnosis not present

## 2024-02-06 DIAGNOSIS — M47816 Spondylosis without myelopathy or radiculopathy, lumbar region: Secondary | ICD-10-CM | POA: Diagnosis not present

## 2024-02-06 DIAGNOSIS — M5416 Radiculopathy, lumbar region: Secondary | ICD-10-CM | POA: Diagnosis not present

## 2024-02-06 MED ORDER — METHYLPREDNISOLONE ACETATE 40 MG/ML IJ SUSP
40.0000 mg | Freq: Once | INTRAMUSCULAR | Status: AC
  Administered 2024-02-06: 40 mg

## 2024-02-06 NOTE — Patient Instructions (Signed)

## 2024-02-06 NOTE — Progress Notes (Unsigned)
 Pain Scale   Average Pain 10 Patient advising that her pain in her lower back in constant and it radiates down her left leg at times also states that sitting at times done increase pain. Patient advising that at times standing and walking does decrease the pain.         +Driver, -BT, -Dye Allergies.

## 2024-02-07 ENCOUNTER — Encounter: Payer: Self-pay | Admitting: Physical Medicine and Rehabilitation

## 2024-02-07 NOTE — Progress Notes (Signed)
 Mandy Holmes - 73 y.o. female MRN [id number]  Date of birth: [birth year]  Office Visit Note: Visit Date: 02/06/2024 PCP: Rey Catholic, MD Referred by: Rey Catholic, MD  Subjective: Chief Complaint  Patient presents with   Lower Back - Pain   HPI: Mandy Holmes is a 73 y.o. female who comes in today for evaluation and management at the request of Dr. Rey Catholic for recent exacerbation of chronic history of low back pain and radicular pain right more than left.  I last saw the patient in the office in 2019 and completed a right L5-S1 interlaminar epidural steroid injection that she says helped quite a bit at that time.  Her brief history from an orthopedic standpoint is that she was seeing Dr. Loy Ruff in our office and ultimately had MRI from 2018 showing multilevel foraminal narrowing with arthritic changes but no high-grade central stenosis or nerve compression or disc herniation.  She went on to have a couple of epidural injections at the time that were very beneficial.  She was since followed by Dr. Rey Catholic in the office until he went into private practice and she is continue to follow him there.  Her husband is present today provides some of the history.  She reports several weeks ago now having an episode without any specific injury where she began having left posterior buttock pain that radiated down the leg to about the knee.  She reports this is a deep aching pain severe pain somewhat tearing or burning at first but not describing it that way now.  She did occasionally it would go down to the ankle and foot but that was on rare occasions.  Nothing on the right.  She does report chronic history of constipation for many years and reports not really seeing a GI doctor for that but her husband does comment that she has had a colonoscopy.  She reports that she strains daily to try to use the bathroom and that potentially during 1 of those bouts using the bathroom is when  the pain started.  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  She gets worsening symptoms at night laying down and with sitting for a long time.  Standing and walking actually seems to help.  She has had no focal weakness or foot drop.  No changes in her bowel or bladder other than otherwise noted.  No fevers chills or night sweats etc.  She has been doing some home exercises through Dr. Armandina Bernard.  She has not had formal physical therapy currently.  She has been taking some tramadol  and hydrocodone  with no real relief at all.  They do have a trip planned coming up to [place] .  Her case is complicated by multiple medical issues including chronic fatigue and what seems to be irritable bowel syndrome.  No clinical diagnosis noted of fibromyalgia.   I spent more than 40 minutes speaking face-to-face with the patient with 50% of the time in counseling and discussing coordination of care.       Review of Systems  Gastrointestinal:  Positive for constipation.  Musculoskeletal:  Positive for back pain and joint pain.  All other systems reviewed and are negative.  Otherwise per HPI.  Assessment & Plan: Visit Diagnoses:    ICD-10-CM   1. Lumbar radiculopathy  M54.16 XR C-ARM NO REPORT    Epidural Steroid injection    methylPREDNISolone  acetate (DEPO-MEDROL ) injection 40 mg    2. Pain in left hip  M25.552     3. Ischial pain, left  M25.552     4. Foraminal stenosis of lumbar region  M48.061     5. Spondylosis without myelopathy or radiculopathy, lumbar region  M47.816        Plan: Findings:  Recent exacerbation without specific injury of low back and buttock pain particularly left buttock hamstring insertion pain with referral down the leg.  Differential diagnosis would be S1 radiculopathy from likely disc herniation at that level at L5-S1 given the  symptoms worsening with sitting.  Secondary diagnosis would be potential for ischial bursa tendinitis tendinosis.  Less likely would be more stenosis or facet arthropathy from the lumbar spine.  We elected today to complete lumbar epidural injection diagnostically and hopefully therapeutically.  Depending on relief she would need updated imaging of the lumbar spine and/or potentially pelvis.  She probably would benefit from a short course of focused physical therapy.  Also recommend although I am sure Dr. Armandina Bernard is looking into this her chronic constipation for a bowel regimen for her and may be follow-up with gastroenterology.    Meds & Orders:  Meds ordered this encounter  Medications   methylPREDNISolone  acetate (DEPO-MEDROL ) injection 40 mg    Orders Placed This Encounter  Procedures   XR C-ARM NO REPORT   Epidural Steroid injection    Follow-up: No follow-ups on file.   Procedures: No procedures performed  Lumbar Epidural Steroid Injection - Interlaminar Approach with Fluoroscopic Guidance  Patient: Mandy Holmes      Date of Birth: Oct 28, 1950 MRN: [id number] PCP: Rey Catholic, MD      Visit Date: 02/06/2024   Universal Protocol:     Consent Given By: the patient  Position: PRONE  Additional Comments: Vital signs were monitored before and after the procedure. Patient was prepped and draped in the usual sterile fashion. The correct patient, procedure, and site was verified.   Injection Procedure Details:   Procedure diagnoses: Lumbar radiculopathy [M54.16]   Meds Administered:  Meds ordered this encounter  Medications   methylPREDNISolone  acetate (DEPO-MEDROL ) injection 40 mg     Laterality: Left  Location/Site:  L5-S1  Needle: 3.5 in., 20 ga. Tuohy  Needle Placement: Paramedian epidural  Findings:   -Comments: Excellent flow of contrast into the epidural space.  Procedure Details: Using a paramedian approach from the side mentioned above, the region  overlying the inferior lamina was localized under fluoroscopic visualization and the soft tissues overlying this structure were infiltrated with 4 ml. of 1% Lidocaine  without Epinephrine . The Tuohy needle was inserted into the epidural space using a paramedian approach.   The epidural space was localized using loss of resistance along with counter oblique bi-planar fluoroscopic views.  After negative aspirate for air, blood, and CSF, a 2 ml. volume of Isovue -250 was injected into the epidural space and the flow of contrast was observed. Radiographs were obtained for documentation purposes.    The injectate was administered into the level noted above.   Additional Comments:  The patient tolerated the procedure well Dressing: 2 x 2 sterile gauze and Band-Aid    Post-procedure details: Patient was observed during the procedure. Post-procedure instructions were reviewed.  Patient left the clinic in stable condition.   Clinical History: MRI LUMBAR SPINE WITHOUT CONTRAST    TECHNIQUE:  Multiplanar, multisequence MR imaging  of the lumbar spine was  performed. No intravenous contrast was administered.    COMPARISON:  Lumbar spine radiographs September 13, 2017 at 1409 hours  and MRI lumbar spine June 16, 2015    FINDINGS:  SEGMENTATION: For the purposes of this report, the last well-formed  intervertebral disc is reported as L5-S1.    ALIGNMENT: Maintained lumbar lordosis. No malalignment.    VERTEBRAE:Vertebral bodies are intact. Moderate to severe L2-3 and  L3-4 disc height loss, mildly progressed from prior MRI. Similar  mild L4-5 and L5-S1 disc height loss. Mild disc desiccation all  lumbar levels. Multilevel mild chronic discogenic endplate changes  without acute or abnormal bone marrow signal. Scattered old  Schmorl's nodes.    CONUS MEDULLARIS AND CAUDA EQUINA: Conus medullaris terminates at  T12-L1 and demonstrates normal morphology and signal  characteristics. Cauda  equina is normal.    PARASPINAL AND OTHER SOFT TISSUES: Included prevertebral and  paraspinal soft tissues are normal.    DISC LEVELS:    L1-2: No disc bulge, canal stenosis nor neural foraminal narrowing.    L2-3: Similar 3 mm broad-based disc bulge. No canal stenosis. Mild  bilateral caudal neural foraminal narrowing.    L3-4: Similar to slightly decreased 2 mm broad-based disc bulge  asymmetric to the LEFT could affect the exited LEFT L3 nerve,  slightly worse. Mild facet arthropathy and ligamentum flavum  redundancy without canal stenosis. Mild to moderate bilateral neural  foraminal narrowing.    L4-5: Similar 2 mm broad-based disc bulge. Similar moderate to  severe facet arthropathy and ligamentum flavum redundancy. Minimal  canal stenosis in trefoil configuration. Mild RIGHT neural foraminal  narrowing.    L5-S1: Similar 2 mm broad-based disc bulge. Moderate to severe facet  arthropathy without canal stenosis. Mild RIGHT neural foraminal  narrowing.    IMPRESSION:  1. 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  2. Minimal canal stenosis L4-5.  3. Neural foraminal narrowing L3-4 through L5-S1: Mild to moderate  at L3-4. Possible exited LEFT L3 nerve impingement.  4. These results will be called to the ordering clinician or  representative by the Radiology Department at the imaging location.      Electronically Signed    By: Theador Finer M.D.    On: 09/13/2017 20:10   She reports that she has never smoked. She has never used smokeless tobacco. No results for input(s): "HGBA1C", "LABURIC" in the last 8760 hours.  Objective:  VS:  HT:    WT:   BMI:     BP:(!) 156/86  HR:74bpm  TEMP: ( )  RESP:  Physical Exam Vitals and nursing note reviewed.  Constitutional:      General: She is not in acute distress.    Appearance: Normal appearance. She is well-developed. She is not ill-appearing.  HENT:     Head:  Normocephalic and atraumatic.     Nose: Nose normal.     Mouth/Throat:     Mouth: Mucous membranes are dry.  Eyes:     Conjunctiva/sclera: Conjunctivae normal.     Pupils: Pupils are equal, round, and reactive to light.  Cardiovascular:     Rate and Rhythm: Normal rate.     Pulses: Normal pulses.  Pulmonary:     Effort: Pulmonary effort is normal.  Abdominal:     General: There is no distension.     Palpations: Abdomen is soft.  Musculoskeletal:        General: Tenderness present.  Cervical back: Normal range of motion and neck supple.     Right lower leg: No edema.     Left lower leg: No edema.     Comments: Patient ambulates with an antalgic gait to the left.  She is very apprehensive about examining the left lower extremity.  Nonetheless she does not seem to have exquisite pain over the greater trochanters bilaterally.  No pain with hip rotation.  She has an equivocally positive slump test on the left.  She does have good strength 5 out of 5 with dorsiflexion plantarflexion EHL and knee flexion extension.  Skin:    General: Skin is warm and dry.     Findings: No erythema or rash.  Neurological:     General: No focal deficit present.     Mental Status: She is alert and oriented to person, place, and time.     Cranial Nerves: No cranial nerve deficit.     Sensory: No sensory deficit.     Motor: No weakness or abnormal muscle tone.     Coordination: Coordination normal.     Gait: Gait abnormal.  Psychiatric:        Mood and Affect: Mood normal.        Behavior: Behavior normal.     Ortho Exam  Imaging: XR C-ARM NO REPORT Result Date: 02/06/2024 Please see Notes tab for imaging impression.   Past Medical/Family/Surgical/Social History: Medications & Allergies reviewed per EMR, new medications updated. Patient Active Problem List   Diagnosis Date Noted   OSA on CPAP 01/20/2021   Class 1 obesity due to excess calories without serious comorbidity with body mass index  (BMI) of 31.0 to 31.9 in adult 01/29/2018   Hypercalcemia 09/15/2017   Elevated blood pressure reading 09/15/2017   Dyspareunia, female 12/22/2015   Preventative health care 05/19/2015   History of estrogen deficiency 05/19/2015   Chronic fatigue 05/19/2015   Hypothyroidism 05/19/2015   History of uterine cancer 11/07/2013   Past Medical History:  Diagnosis Date   Basal cell carcinoma 07/07/2020   right tips of nose -([facility]) Dr. Loa Riling   Cancer [facility])    endometrial   Constipation    Headache(784.0)    migraines   Hypothyroidism    Migraine    Sleep apnea    CPAP   Venous insufficiency    bilateral lower legs   Family History  Problem Relation Age of Onset   Uterine cancer Mother 10   Stroke Mother    Cancer Mother    Diabetes Father    Skin cancer Father        unsure if melanoma   Cancer Father    Kidney disease Sister        On dialysis   Breast cancer Maternal Aunt 47 - 79   Skin cancer Brother        had at younger age; unsure if melanoma   Heart disease Brother    Colon cancer Neg Hx    Thyroid  cancer Neg Hx    Thyroid  disease Neg Hx    Past Surgical History:  Procedure Laterality Date   ABDOMINAL HYSTERECTOMY     APPENDECTOMY     KNEE ARTHROSCOPY Left    ROBOTIC ASSISTED TOTAL HYSTERECTOMY WITH BILATERAL SALPINGO OOPHERECTOMY N/A 12/03/2013   Procedure: ROBOTIC ASSISTED TOTAL HYSTERECTOMY WITH BILATERAL SALPINGO OOPHORECTOMY WITH BILATERAL LYMPH NODE DISSECTION;  Surgeon: Daryel Ensign A. Clerance Dais, MD;  Location: [facility];  Service: Gynecology;  Laterality: N/A;   TUBAL LIGATION  VENTRAL HERNIA REPAIR N/A 07/29/2021   Procedure: LAPAROSCOPIC VENTRAL HERNIA REPAIR WITH MESH;  Surgeon: Enid Harry, MD;  Location: [facility];  Service: General;  Laterality: N/A;   WISDOM TOOTH EXTRACTION     Social History   Occupational History   Not on file  Tobacco Use   Smoking status: Never   Smokeless tobacco: Never  Vaping Use   Vaping status: Never Used  Substance  and Sexual Activity   Alcohol use: No    Alcohol/week: 0.0 standard drinks of alcohol   Drug use: No   Sexual activity: Yes

## 2024-02-07 NOTE — Procedures (Signed)
 Lumbar Epidural Steroid Injection - Interlaminar Approach with Fluoroscopic Guidance  Patient: Mandy Holmes      Date of Birth: [birth year] MRN: [id number] PCP: Rey Catholic, MD      Visit Date: 02/06/2024   Universal Protocol:     Consent Given By: the patient  Position: PRONE  Additional Comments: Vital signs were monitored before and after the procedure. Patient was prepped and draped in the usual sterile fashion. The correct patient, procedure, and site was verified.   Injection Procedure Details:   Procedure diagnoses: Lumbar radiculopathy [M54.16]   Meds Administered:  Meds ordered this encounter  Medications   methylPREDNISolone  acetate (DEPO-MEDROL ) injection 40 mg     Laterality: Left  Location/Site:  L5-S1  Needle: 3.5 in., 20 ga. Tuohy  Needle Placement: Paramedian epidural  Findings:   -Comments: Excellent flow of contrast into the epidural space.  Procedure Details: Using a paramedian approach from the side mentioned above, the region overlying the inferior lamina was localized under fluoroscopic visualization and the soft tissues overlying this structure were infiltrated with 4 ml. of 1% Lidocaine  without Epinephrine . The Tuohy needle was inserted into the epidural space using a paramedian approach.   The epidural space was localized using loss of resistance along with counter oblique bi-planar fluoroscopic views.  After negative aspirate for air, blood, and CSF, a 2 ml. volume of Isovue -250 was injected into the epidural space and the flow of contrast was observed. Radiographs were obtained for documentation purposes.    The injectate was administered into the level noted above.   Additional Comments:  The patient tolerated the procedure well Dressing: 2 x 2 sterile gauze and Band-Aid    Post-procedure details: Patient was observed during the procedure. Post-procedure instructions were reviewed.  Patient left the clinic in stable  condition.

## 2024-02-12 ENCOUNTER — Other Ambulatory Visit (HOSPITAL_BASED_OUTPATIENT_CLINIC_OR_DEPARTMENT_OTHER): Payer: Self-pay

## 2024-02-12 MED ORDER — TRAMADOL HCL 50 MG PO TABS
50.0000 mg | ORAL_TABLET | Freq: Four times a day (QID) | ORAL | 0 refills | Status: DC | PRN
  Filled 2024-02-12: qty 60, 15d supply, fill #0

## 2024-02-13 ENCOUNTER — Other Ambulatory Visit: Payer: Self-pay | Admitting: Family Medicine

## 2024-02-13 DIAGNOSIS — M5410 Radiculopathy, site unspecified: Secondary | ICD-10-CM

## 2024-02-29 DIAGNOSIS — D0471 Carcinoma in situ of skin of right lower limb, including hip: Secondary | ICD-10-CM | POA: Diagnosis not present

## 2024-02-29 DIAGNOSIS — I872 Venous insufficiency (chronic) (peripheral): Secondary | ICD-10-CM | POA: Diagnosis not present

## 2024-03-05 ENCOUNTER — Other Ambulatory Visit (HOSPITAL_BASED_OUTPATIENT_CLINIC_OR_DEPARTMENT_OTHER): Payer: Self-pay

## 2024-03-05 ENCOUNTER — Other Ambulatory Visit (HOSPITAL_COMMUNITY): Payer: Self-pay

## 2024-03-05 ENCOUNTER — Other Ambulatory Visit: Payer: Self-pay

## 2024-03-05 MED ORDER — METHOCARBAMOL 500 MG PO TABS
500.0000 mg | ORAL_TABLET | Freq: Four times a day (QID) | ORAL | 6 refills | Status: DC | PRN
  Filled 2024-03-05: qty 120, 15d supply, fill #0
  Filled 2024-03-15 – 2024-03-18 (×2): qty 120, 15d supply, fill #1
  Filled 2024-04-04: qty 120, 15d supply, fill #2
  Filled 2024-04-19: qty 120, 15d supply, fill #3
  Filled 2024-05-02: qty 120, 15d supply, fill #4
  Filled 2024-05-22 (×2): qty 120, 15d supply, fill #5
  Filled 2024-06-26: qty 120, 15d supply, fill #6

## 2024-03-05 MED ORDER — IBUPROFEN 400 MG PO TABS
400.0000 mg | ORAL_TABLET | ORAL | 3 refills | Status: DC
  Filled 2024-03-05: qty 120, 20d supply, fill #0

## 2024-03-06 ENCOUNTER — Ambulatory Visit
Admission: RE | Admit: 2024-03-06 | Discharge: 2024-03-06 | Disposition: A | Discharge status: Home / Self Care | Source: Ambulatory Visit | Attending: Family Medicine | Admitting: Family Medicine

## 2024-03-06 DIAGNOSIS — M48061 Spinal stenosis, lumbar region without neurogenic claudication: Secondary | ICD-10-CM | POA: Diagnosis not present

## 2024-03-06 DIAGNOSIS — M4316 Spondylolisthesis, lumbar region: Secondary | ICD-10-CM | POA: Diagnosis not present

## 2024-03-06 DIAGNOSIS — M5410 Radiculopathy, site unspecified: Secondary | ICD-10-CM

## 2024-03-06 DIAGNOSIS — M5416 Radiculopathy, lumbar region: Secondary | ICD-10-CM | POA: Diagnosis not present

## 2024-03-16 ENCOUNTER — Other Ambulatory Visit (HOSPITAL_BASED_OUTPATIENT_CLINIC_OR_DEPARTMENT_OTHER): Payer: Self-pay

## 2024-03-26 ENCOUNTER — Other Ambulatory Visit (HOSPITAL_BASED_OUTPATIENT_CLINIC_OR_DEPARTMENT_OTHER): Payer: Self-pay

## 2024-03-26 MED ORDER — ONDANSETRON 4 MG PO TBDP
4.0000 mg | ORAL_TABLET | Freq: Four times a day (QID) | ORAL | 3 refills | Status: AC | PRN
  Filled 2024-03-26: qty 30, 8d supply, fill #0

## 2024-04-04 ENCOUNTER — Other Ambulatory Visit: Payer: Self-pay

## 2024-04-04 ENCOUNTER — Other Ambulatory Visit (HOSPITAL_BASED_OUTPATIENT_CLINIC_OR_DEPARTMENT_OTHER): Payer: Self-pay

## 2024-04-04 MED ORDER — TRAMADOL HCL 50 MG PO TABS
50.0000 mg | ORAL_TABLET | Freq: Four times a day (QID) | ORAL | 0 refills | Status: DC | PRN
  Filled 2024-04-04: qty 60, 15d supply, fill #0

## 2024-04-05 ENCOUNTER — Other Ambulatory Visit (HOSPITAL_BASED_OUTPATIENT_CLINIC_OR_DEPARTMENT_OTHER): Payer: Self-pay

## 2024-04-05 DIAGNOSIS — Z6828 Body mass index (BMI) 28.0-28.9, adult: Secondary | ICD-10-CM | POA: Diagnosis not present

## 2024-04-05 DIAGNOSIS — M5432 Sciatica, left side: Secondary | ICD-10-CM | POA: Diagnosis not present

## 2024-04-05 MED ORDER — GABAPENTIN 300 MG PO CAPS
300.0000 mg | ORAL_CAPSULE | Freq: Three times a day (TID) | ORAL | 1 refills | Status: AC
  Filled 2024-04-05: qty 90, 30d supply, fill #0

## 2024-04-09 ENCOUNTER — Other Ambulatory Visit (HOSPITAL_BASED_OUTPATIENT_CLINIC_OR_DEPARTMENT_OTHER): Payer: Self-pay

## 2024-04-09 ENCOUNTER — Telehealth: Payer: Self-pay

## 2024-04-09 DIAGNOSIS — R202 Paresthesia of skin: Secondary | ICD-10-CM

## 2024-04-09 MED ORDER — HYDROCODONE-ACETAMINOPHEN 5-325 MG PO TABS
1.0000 | ORAL_TABLET | ORAL | 0 refills | Status: DC | PRN
Start: 2024-04-09 — End: 2024-06-20
  Filled 2024-04-09: qty 60, 10d supply, fill #0

## 2024-04-09 NOTE — Telephone Encounter (Signed)
-----   Message from Prentice Masters sent at 04/09/2024 11:28 AM EDT ----- Can you put in referral for left lower extremity EMG/NCS and also schedule ----- Message ----- From: Zelia Rosina LABOR, RT Sent: 04/09/2024   9:39 AM EDT To: Gardiner Masters, MD   ----- Message ----- From: Vinita Milling Sent: 04/08/2024   2:29 PM EDT To: Rosina LABOR Zelia, RT

## 2024-04-10 ENCOUNTER — Encounter: Payer: Self-pay | Admitting: Neurology

## 2024-04-10 ENCOUNTER — Other Ambulatory Visit: Payer: Self-pay

## 2024-04-10 DIAGNOSIS — R202 Paresthesia of skin: Secondary | ICD-10-CM

## 2024-04-22 ENCOUNTER — Encounter: Payer: Self-pay | Admitting: Physician Assistant

## 2024-04-22 ENCOUNTER — Ambulatory Visit (INDEPENDENT_AMBULATORY_CARE_PROVIDER_SITE_OTHER): Admitting: Physician Assistant

## 2024-04-22 ENCOUNTER — Other Ambulatory Visit (HOSPITAL_BASED_OUTPATIENT_CLINIC_OR_DEPARTMENT_OTHER): Payer: Self-pay

## 2024-04-22 ENCOUNTER — Other Ambulatory Visit (INDEPENDENT_AMBULATORY_CARE_PROVIDER_SITE_OTHER): Payer: Self-pay

## 2024-04-22 DIAGNOSIS — M25552 Pain in left hip: Secondary | ICD-10-CM | POA: Diagnosis not present

## 2024-04-22 MED ORDER — METHYLPREDNISOLONE ACETATE 40 MG/ML IJ SUSP
40.0000 mg | INTRAMUSCULAR | Status: AC | PRN
  Administered 2024-04-22: 40 mg via INTRA_ARTICULAR

## 2024-04-22 MED ORDER — LIDOCAINE HCL 1 % IJ SOLN
3.0000 mL | INTRAMUSCULAR | Status: AC | PRN
  Administered 2024-04-22: 3 mL

## 2024-04-22 MED ORDER — HYDROCODONE-ACETAMINOPHEN 5-325 MG PO TABS
1.0000 | ORAL_TABLET | ORAL | 0 refills | Status: DC
  Filled 2024-04-22: qty 60, 10d supply, fill #0

## 2024-04-22 NOTE — Progress Notes (Signed)
 Office Visit Note   Patient: Mandy Holmes           Date of Birth: 1951-04-27           MRN: [id number] Visit Date: 04/22/2024              Requested by: Hughie Sharper, MD [address] [place],  [place] [address] PCP: Hughie Sharper, MD   Assessment & Plan: Visit Diagnoses:  1. Pain in left hip     Plan: She will work on IT band stretching exercises as shown.  Will see her back in 2 weeks to see how she is doing overall.  Postinjection she did state that her pain was somewhat improved.  She will be mindful of how much relief and duration of relief that she gets with the injection.  Differential would include referred pain from her left knee.  Prior films have shown basically bone-on-bone medial compartment of the left knee.  Follow-Up Instructions: Return in 2 weeks (on 05/06/2024), or if symptoms worsen or fail to improve.   Orders:  Orders Placed This Encounter  Procedures   Large Joint Inj   XR HIP UNILAT W OR W/O PELVIS 2-3 VIEWS LEFT   No orders of the defined types were placed in this encounter.     Procedures: Large Joint Inj: L greater trochanter on 04/22/2024 8:46 PM Indications: pain Details: 22 G 1.5 in needle, lateral approach  Arthrogram: No  Medications: 3 mL lidocaine  1 %; 40 mg methylPREDNISolone  acetate 40 MG/ML Outcome: tolerated well, no immediate complications Procedure, treatment alternatives, risks and benefits explained, specific risks discussed. Consent was given by the patient. Immediately prior to procedure a time out was called to verify the correct patient, procedure, equipment, support staff and site/side marked as required. Patient was prepped and draped in the usual sterile fashion.       Clinical Data: No additional findings.   Subjective: Chief Complaint  Patient presents with   Left Hip - Pain    HPI Mandy Holmes 73 year old female were seen for the first time for left hip pain.  She has had pain lateral  aspect of her left hip since April 5.  She has been to neurosurgery Dr. Mavis and he ruled out her back as the source of her pain.  She has pain in the left hip that she states is a pulling like sensation tearing.  She did have a lumbar injection by Dr. Eldonna L5 that she states gave her some relief for 3 weeks and mainly just took the edge off.  No groin pain.  Cannot stand sit walk or lie down without pain.  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  Lesion is unchanged from prior imaging.  Mild bilateral hip arthritis. Patient does note that she has arthritis in both knees and has been told she would need a knee replacement at some point in time.  Review of Systems Negative for fevers or chills.  Objective: Vital Signs: There were no vitals taken for this visit.  Physical Exam Constitutional:      Appearance: She is not ill-appearing or diaphoretic.  Pulmonary:     Effort: Pulmonary effort is normal.  Neurological:     Mental Status: She is alert.  Psychiatric:        Mood and Affect: Mood is anxious. Affect is tearful.     Ortho  Exam Bilateral hips: Smooth range of motion of both hips.  Extreme range of motion left hip causes significant pain.  She has tenderness over the left trochanteric region and down the IT band.  Nontender right hip.  Left knee good range of motion tenderness along medial joint line.  Specialty Comments:  MRI LUMBAR SPINE WITHOUT CONTRAST    TECHNIQUE:  Multiplanar, multisequence MR imaging of the lumbar spine was  performed. No intravenous contrast was administered.    COMPARISON:  Lumbar spine radiographs September 13, 2017 at 1409 hours  and MRI lumbar spine June 16, 2015    FINDINGS:  SEGMENTATION: For the purposes of this report, the last well-formed  intervertebral disc is reported as L5-S1.    ALIGNMENT: Maintained lumbar lordosis. No  malalignment.    VERTEBRAE:Vertebral bodies are intact. Moderate to severe L2-3 and  L3-4 disc height loss, mildly progressed from prior MRI. Similar  mild L4-5 and L5-S1 disc height loss. Mild disc desiccation all  lumbar levels. Multilevel mild chronic discogenic endplate changes  without acute or abnormal bone marrow signal. Scattered old  Schmorl's nodes.    CONUS MEDULLARIS AND CAUDA EQUINA: Conus medullaris terminates at  T12-L1 and demonstrates normal morphology and signal  characteristics. Cauda equina is normal.    PARASPINAL AND OTHER SOFT TISSUES: Included prevertebral and  paraspinal soft tissues are normal.    DISC LEVELS:    L1-2: No disc bulge, canal stenosis nor neural foraminal narrowing.    L2-3: Similar 3 mm broad-based disc bulge. No canal stenosis. Mild  bilateral caudal neural foraminal narrowing.    L3-4: Similar to slightly decreased 2 mm broad-based disc bulge  asymmetric to the LEFT could affect the exited LEFT L3 nerve,  slightly worse. Mild facet arthropathy and ligamentum flavum  redundancy without canal stenosis. Mild to moderate bilateral neural  foraminal narrowing.    L4-5: Similar 2 mm broad-based disc bulge. Similar moderate to  severe facet arthropathy and ligamentum flavum redundancy. Minimal  canal stenosis in trefoil configuration. Mild RIGHT neural foraminal  narrowing.    L5-S1: Similar 2 mm broad-based disc bulge. Moderate to severe facet  arthropathy without canal stenosis. Mild RIGHT neural foraminal  narrowing.    IMPRESSION:  1. 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  2. Minimal canal stenosis L4-5.  3. Neural foraminal narrowing L3-4 through L5-S1: Mild to moderate  at L3-4. Possible exited LEFT L3 nerve impingement.  4. These results will be called to the ordering clinician or  representative by the Radiology Department at the imaging location.      Electronically Signed     By: Minerva Salle M.D.    On: 09/13/2017 20:10  Imaging: XR HIP UNILAT W OR W/O PELVIS 2-3 VIEWS LEFT Result Date: 04/22/2024 AP pelvis and lateral view of the left hip: Bilateral hips well located.  Hips overall well-preserved.  No acute fractures acute findings no bony lesions.    PMFS History: Patient Active Problem List   Diagnosis Date Noted   OSA on CPAP 01/20/2021   Class 1 obesity due to excess calories without serious comorbidity with body mass index (BMI) of 31.0 to 31.9 in adult 01/29/2018   Hypercalcemia 09/15/2017   Elevated blood pressure reading 09/15/2017   Dyspareunia, female 12/22/2015   Preventative health care 05/19/2015   History of estrogen deficiency 05/19/2015   Chronic fatigue 05/19/2015   Hypothyroidism 05/19/2015   History of uterine cancer 11/07/2013   Past  Medical History:  Diagnosis Date   Basal cell carcinoma 07/07/2020   right tips of nose -(MOHS) Dr. Karis   Cancer [facility])    endometrial   Constipation    Headache(784.0)    migraines   Hypothyroidism    Migraine    Sleep apnea    CPAP   Venous insufficiency    bilateral lower legs    Family History  Problem Relation Age of Onset   Uterine cancer Mother 40   Stroke Mother    Cancer Mother    Diabetes Father    Skin cancer Father        unsure if melanoma   Cancer Father    Kidney disease Sister        On dialysis   Breast cancer Maternal Aunt 90 - 79   Skin cancer Brother        had at younger age; unsure if melanoma   Heart disease Brother    Colon cancer Neg Hx    Thyroid  cancer Neg Hx    Thyroid  disease Neg Hx     Past Surgical History:  Procedure Laterality Date   ABDOMINAL HYSTERECTOMY     APPENDECTOMY     KNEE ARTHROSCOPY Left    ROBOTIC ASSISTED TOTAL HYSTERECTOMY WITH BILATERAL SALPINGO OOPHERECTOMY N/A 12/03/2013   Procedure: ROBOTIC ASSISTED TOTAL HYSTERECTOMY WITH BILATERAL SALPINGO OOPHORECTOMY WITH BILATERAL LYMPH NODE DISSECTION;  Surgeon: Elenore A.  Dodie, MD;  Location: [facility];  Service: Gynecology;  Laterality: N/A;   TUBAL LIGATION     VENTRAL HERNIA REPAIR N/A 07/29/2021   Procedure: LAPAROSCOPIC VENTRAL HERNIA REPAIR WITH MESH;  Surgeon: Ebbie Cough, MD;  Location: [facility];  Service: General;  Laterality: N/A;   WISDOM TOOTH EXTRACTION     Social History   Occupational History   Not on file  Tobacco Use   Smoking status: Never   Smokeless tobacco: Never  Vaping Use   Vaping status: Never Used  Substance and Sexual Activity   Alcohol use: No    Alcohol/week: 0.0 standard drinks of alcohol   Drug use: No   Sexual activity: Yes

## 2024-05-06 ENCOUNTER — Ambulatory Visit: Admitting: Physician Assistant

## 2024-05-06 ENCOUNTER — Other Ambulatory Visit (HOSPITAL_BASED_OUTPATIENT_CLINIC_OR_DEPARTMENT_OTHER): Payer: Self-pay

## 2024-05-06 ENCOUNTER — Encounter: Payer: Self-pay | Admitting: Physician Assistant

## 2024-05-06 DIAGNOSIS — M25552 Pain in left hip: Secondary | ICD-10-CM

## 2024-05-06 MED ORDER — CELECOXIB 200 MG PO CAPS
200.0000 mg | ORAL_CAPSULE | Freq: Two times a day (BID) | ORAL | 1 refills | Status: DC
  Filled 2024-05-06 (×2): qty 60, 30d supply, fill #0
  Filled 2024-06-02: qty 60, 30d supply, fill #1

## 2024-05-06 NOTE — Progress Notes (Signed)
 HPI: Mandy Holmes returns today for follow-up of her left thigh pain.  She states the injection did not help at all.  She has been taking ibuprofen  and Goody powders with no relief.  She has been trying to do the IT band stretching exercises but unable to perform except for once a day due to the pain.  Pain is mostly the lateral aspect of the left hip down to the knee occasionally has some pain down to the foot.  Again Dr. Mavis evaluated her back and did not feel that this was back related.  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  Mild hip arthritis.  Review of systems see HPI  Physical exam: General Well-developed well-nourished female is able to transfer on her own from the wheelchair to table.  She is able to go from a seated position to lying the back up to seated position which is changed from prior exam. Left hip full range of motion without pain.  Tenderness over the left hip trochanteric region and at the IT band insertion.  Left knee good range of motion.  Bilateral knees slight patellofemoral crepitus.  Tenderness medial joint line of the left knee.  No gross instability with valgus varus stressing of either knee.  Impression: Left hip IT band syndrome  Plan: I have her stop all NSAIDs.  Will place her on Celebrex  200 mg twice daily see if this helps with her pain.  Will refer to physical therapy for IT band stretching home exercise program and modalities.  Questions were encouraged and answered by Dr. Vernetta and myself.

## 2024-05-14 ENCOUNTER — Ambulatory Visit: Admitting: Neurology

## 2024-05-14 DIAGNOSIS — R202 Paresthesia of skin: Secondary | ICD-10-CM

## 2024-05-14 NOTE — Procedures (Signed)
  [facility]  [address] [place], [address]  [place], [place] [address] Tel: [phone] Fax: [phone] Test Date:  05/14/2024  Patient: Mandy Holmes DOB: 11-30-1950 Physician: Venetia Potters, MD  Sex: Female Height: 5' 4.75 Ref Phys: Reyes Budge, MD  ID#: [id number]   Technician:    History: This is a 73 year old female with left leg pain.  NCV & EMG Findings: Extensive electrodiagnostic evaluation of the left lower limb shows: Left sural and superficial peroneal/fibular sensory responses are within normal limits. Left peroneal/fibular (EDB), peroneal/fibular (TA), and tibial (AH) motor responses are within normal limits. Left H reflex latency is within normal limits. There is no evidence of active or chronic motor axon loss changes affecting any of the tested muscles. Motor unit configuration and recruitment pattern is within normal limits.  Impression: This is a normal study of the left lower limb. In particular, there is no electrodiagnostic evidence of a left lumbosacral (L3-S1) radiculopathy, lumbosacral plexopathy, or large fiber sensorimotor neuropathy.    ___________________________ Venetia Potters, MD    Nerve Conduction Studies Motor Nerve Results    Latency Amplitude F-Lat Segment Distance CV Comment  Site (ms) Norm (mV) Norm (ms)  (cm) (m/s) Norm   Left Fibular (EDB) Motor  Ankle 3.9  < 6.0 5.1  > 2.5        Bel fib head 10.8 - 4.2 -  Bel fib head-Ankle 29.5 43  > 40   Pop fossa 12.5 - 4.2 -  Pop fossa-Bel fib head 8 47 -   Left Fibular (TA) Motor  Fib head 2.3  < 4.5 3.7  > 3.0        Left Tibial (AH) Motor  Ankle 3.9  < 6.0 8.0  > 4.0        Knee 12.4 - 6.5 -  Knee-Ankle 39 46  > 40    Sensory Sites    Neg Peak Lat Amplitude (O-P) Segment Distance Velocity Comment  Site (ms) Norm (V) Norm  (cm) (ms)   Left Superficial Fibular Sensory  14 cm-Ankle 2.5  < 4.6 3  > 3 14 cm-Ankle 14    Left Sural Sensory  Calf-Lat mall 3.2  < 4.6 3  > 3  Calf-Lat mall 14     H-Reflex Results    M-Lat H Lat H Neg Amp H-M Lat  Site (ms) (ms) Norm (mV) (ms)  Left Tibial H-Reflex  Pop fossa 5.3 -  < 35.0 - -   Electromyography   Side Muscle Ins.Act Fibs Fasc Recrt Amp Dur Poly Activation Comment  Left Tib ant Nml Nml Nml Nml Nml Nml Nml Nml N/A  Left Gastroc MH Nml Nml Nml Nml Nml Nml Nml Nml N/A  Left Rectus fem Nml Nml Nml Nml Nml Nml Nml Nml N/A  Left Biceps fem SH Nml Nml Nml Nml Nml Nml Nml Nml N/A  Left Gluteus med Nml Nml Nml Nml Nml Nml Nml Nml N/A      Waveforms:  Motor        Sensory      H-Reflex

## 2024-05-23 ENCOUNTER — Encounter: Payer: Self-pay | Admitting: Physical Therapy

## 2024-05-23 ENCOUNTER — Ambulatory Visit: Admitting: Physical Therapy

## 2024-05-23 DIAGNOSIS — M6281 Muscle weakness (generalized): Secondary | ICD-10-CM

## 2024-05-23 DIAGNOSIS — R29898 Other symptoms and signs involving the musculoskeletal system: Secondary | ICD-10-CM | POA: Diagnosis not present

## 2024-05-23 DIAGNOSIS — M79605 Pain in left leg: Secondary | ICD-10-CM

## 2024-05-23 DIAGNOSIS — M25552 Pain in left hip: Secondary | ICD-10-CM | POA: Diagnosis not present

## 2024-05-23 NOTE — Therapy (Signed)
 OUTPATIENT PHYSICAL THERAPY EVALUATION   Patient Name: Mandy Holmes MRN: [id number] DOB:29-Dec-1950, 73 y.o., female Today's Date: 05/23/2024  END OF SESSION:  PT End of Session - 05/23/24 1454     Visit Number 1    Number of Visits 12    Date for PT Re-Evaluation 07/04/24    Authorization Type [employer]    Progress Note Due on Visit 10    PT Start Time 1455    PT Stop Time 1540    PT Time Calculation (min) 45 min    Activity Tolerance Patient tolerated treatment well    Behavior During Therapy [facility] for tasks assessed/performed          Past Medical History:  Diagnosis Date   Basal cell carcinoma 07/07/2020   right tips of nose -(MOHS) Dr. Karis   Cancer [facility])    endometrial   Constipation    Headache(784.0)    migraines   Hypothyroidism    Migraine    Sleep apnea    CPAP   Venous insufficiency    bilateral lower legs   Past Surgical History:  Procedure Laterality Date   ABDOMINAL HYSTERECTOMY     APPENDECTOMY     KNEE ARTHROSCOPY Left    ROBOTIC ASSISTED TOTAL HYSTERECTOMY WITH BILATERAL SALPINGO OOPHERECTOMY N/A 12/03/2013   Procedure: ROBOTIC ASSISTED TOTAL HYSTERECTOMY WITH BILATERAL SALPINGO OOPHORECTOMY WITH BILATERAL LYMPH NODE DISSECTION;  Surgeon: Elenore A. Dodie, MD;  Location: [facility];  Service: Gynecology;  Laterality: N/A;   TUBAL LIGATION     VENTRAL HERNIA REPAIR N/A 07/29/2021   Procedure: LAPAROSCOPIC VENTRAL HERNIA REPAIR WITH MESH;  Surgeon: Ebbie Cough, MD;  Location: [facility];  Service: General;  Laterality: N/A;   WISDOM TOOTH EXTRACTION     Patient Active Problem List   Diagnosis Date Noted   OSA on CPAP 01/20/2021   Class 1 obesity due to excess calories without serious comorbidity with body mass index (BMI) of 31.0 to 31.9 in adult 01/29/2018   Hypercalcemia 09/15/2017   Elevated blood pressure reading 09/15/2017   Dyspareunia, female 12/22/2015   Preventative health care 05/19/2015   History of estrogen  deficiency 05/19/2015   Chronic fatigue 05/19/2015   Hypothyroidism 05/19/2015   History of uterine cancer 11/07/2013    PCP: Hughie Sharper, MD  REFERRING PROVIDER: Gretta Bertrum ORN, PA-C  REFERRING DIAG: [id number] (ICD-10-CM) - Pain in left hip  Rationale for Evaluation and Treatment: Rehabilitation  THERAPY DIAG:  Pain in left hip - Plan: PT plan of care cert/re-cert  Pain in left leg - Plan: PT plan of care cert/re-cert  Muscle weakness (generalized) - Plan: PT plan of care cert/re-cert  Other symptoms and signs involving the musculoskeletal system - Plan: PT plan of care cert/re-cert  ONSET DATE: 01/13/24   SUBJECTIVE:  SUBJECTIVE STATEMENT: Pt reports waking up in April with pain from knee to thigh, which has progressed from buttock to foot.  She is unable to sit on Lt side.  She had lumbar injection in early May which helped for about 2 weeks, but since then pain has returned.     PERTINENT HISTORY:  Hx endometrial cancer, migraines, OSA, venous insufficiency  PAIN:  Are you having pain? Yes: NPRS scale: 10/10 always Pain location: Lt hip/buttock/hamstring Pain description: ripping, pulling, sharp Aggravating factors: standing (unable to stand longer than a few seconds) Relieving factors: injection helped briefly  PRECAUTIONS:  None  RED FLAGS: None   WEIGHT BEARING RESTRICTIONS:  No  FALLS:  Has patient fallen in last 6 months? No  LIVING ENVIRONMENT: Lives with: lives with their spouse Lives in: House/apartment Stairs: Yes: External: 3 steps; on left going up (does need help with stairs) Has following equipment at home: Single point cane  OCCUPATION:  Retired   PLOF:  Independent and Leisure: household chores, reading, walking  PATIENT GOALS:  Improve pain, be  able to return to regular activities   OBJECTIVE:  Note: Objective measures were completed at Evaluation unless otherwise noted.  DIAGNOSTIC FINDINGS:  MRI Lumbar Spine: Progression of degenerative disc desiccation with mild caudal foraminal narrowing at L3-4 and L4-5. No high-grade foraminal or spinal stenosis is present.   Mild grade 1 anterior spondylolisthesis of L5 on S1 secondary to moderate to severe facet arthrosis. There is crowding of the descending nerve roots in the lateral recess, left greater than right. Correlation for mild left S1 radicular symptoms. 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 Mild hip arthritis.  PATIENT SURVEYS:  Patient-Specific Activity Scoring Scheme  0 represents "unable to perform." 10 represents "able to perform at prior level. 0 1 2 3 4 5 6 7 8 9  10 (Date and Score)   Activity Eval     1. Sitting on Lt side 2    2. Standing 2    3. Lying down 5   4.    5.    Score 3    Total score = sum of the activity scores/number of activities Minimum detectable change (90%CI) for average score = 2 points Minimum detectable change (90%CI) for single activity score = 3 points    COGNITIVE STATUS: Within functional limits for tasks assessed   SENSATION: WFL  POSTURE:  Rt lateral shift in standing   GAIT: 05/23/24 Distance walked: 100' within clinic Assistive device utilized: Single point cane Level of assistance: Modified independence Comments: antalgic gait with decreased stance on LLE   PALPATION: 05/23/24 trigger points and tenderness throughout glutes, hamstring   LUMBAR ROM:  pain all directions, no increase with quadrant testing  ROM A/PROM  eval  Flexion WNL with pain  Extension WNL with pain  Right quadrant WNL  Left quadrant WNL   (Blank rows = not tested)   LOWER EXTREMITY MMT:    MMT Right eval Left eval  Hip flexion 3+/5 3+/5  Hip extension  3/5  Hip  abduction  3/5  Hip adduction    Hip internal rotation    Hip external rotation    Knee flexion  3+/5  Knee extension     (Blank rows = not tested)    SPECIAL TESTS:  05/23/24 Lumbar Slump test: no change in pain with testing; increased pain with decreased neural tension  TREATMENT:  DATE:  05/23/24 TherEx See HEP - demonstrated with trial reps performed PRN, mod cues for comprehension    Self Care Educated on PT POC and clinical findings, discussed possibility of DN   Manual Tennis ball to Lt hamstrings and glutes/piriformis    PATIENT EDUCATION:  Education details: HEP, DN Person educated: Patient Education method: Explanation, Demonstration, and Handouts Education comprehension: verbalized understanding, returned demonstration, and needs further education  HOME EXERCISE PROGRAM: Access Code: [email or web address] URL: [email or web address] Date: 05/23/2024 Prepared by: Corean Ku  Exercises - Hip Neural Slide/Glide  - 2 x daily - 7 x weekly - 1 sets - 10 reps - 5-10 sec hold - Knee Nerve Slide/Glide  - 2 x daily - 7 x weekly - 1 sets - 10 reps - 5-10  sec hold - Ankle Slide/Glide  - 2 x daily - 7 x weekly - 1 sets - 10 reps - 5-10 sec hold - Right Standing Lateral Shift Correction at Wall - Repetitions  - 2-3 x daily - 7 x weekly - 1 sets - 10 reps - 10 sec hold - Standing Lumbar Extension at Wall - Forearms  - 2-3 x daily - 7 x weekly - 1 sets - 10 reps - 10 sec hold - Self Release   - 2-3 x daily - 7 x weekly - 1 sets - 1 reps - 2-3 min hold - IT Band Mobilization with Small Ball  - 2-3 x daily - 7 x weekly - 1 sets - 1 reps - 2-3 min hold   ASSESSMENT:  CLINICAL IMPRESSION: Patient is a 73 y.o. female who was seen today for physical therapy evaluation and treatment for Lt hip pain. She demonstrates increased pain and weakness  with trigger points and gait abnormalities affecting functional mobility.  She will benefit from PT to address deficits listed.     OBJECTIVE IMPAIRMENTS: Abnormal gait, decreased coordination, decreased mobility, difficulty walking, decreased strength, increased fascial restrictions, increased muscle spasms, impaired flexibility, postural dysfunction, and pain.   ACTIVITY LIMITATIONS: carrying, lifting, bending, sitting, standing, sleeping, stairs, transfers, bed mobility, and locomotion level  PARTICIPATION LIMITATIONS: meal prep, cleaning, laundry, driving, shopping, and community activity  PERSONAL FACTORS: Age, Past/current experiences, Time since onset of injury/illness/exacerbation, and 3+ comorbidities: Hx endometrial cancer, migraines, OSA, venous insufficiency are also affecting patient's functional outcome.   REHAB POTENTIAL: Good  CLINICAL DECISION MAKING: Evolving/moderate complexity  EVALUATION COMPLEXITY: Moderate   GOALS: Goals reviewed with patient? Yes  SHORT TERM GOALS: Target date: 06/13/2024  Independent with initial HEP Goal status: INITIAL   LONG TERM GOALS: Target date: 07/04/2024  Independent with final HEP Goal status: INITIAL  2.  PSFS score improved by 3 points Goal status: INITIAL  3.  Lt hip strength improved to 4/5 for improved function Goal status: INIITAL  4.  Report pain < 4/10 with sitting and standing for improved function Goal status: INITIAL  5.  Report ability to perform housework at least 50% capacity for improved function Goal status: INITIAL   PLAN:  PT FREQUENCY: 1-2x/week  PT DURATION: 6 weeks  PLANNED INTERVENTIONS: 97164- PT Re-evaluation, 97750- Physical Performance Testing, 97110-Therapeutic exercises, 97530- Therapeutic activity, 97112- Neuromuscular re-education, 97535- Self Care, [id number]- Manual therapy, [phone]- Gait training, [phone]- Aquatic Therapy, [phone]- Electrical stimulation (unattended), [phone]- Traction  (mechanical), [id number]- Ionotophoresis 4mg /ml Dexamethasone , 79439 (1-2 muscles), [id number] (3+ muscles)- Dry Needling, Patient/Family education, Balance training, Stair training, Taping, Spinal manipulation, Spinal mobilization, Cryotherapy, and Moist heat.  PLAN FOR NEXT SESSION: Review HEP, consider  DN to hamstring/glutes/piriformis, hip strengthening as tolerated   NEXT MD VISIT: 06/20/24   Corean JULIANNA Ku, PT, DPT 05/23/24 3:49 PM

## 2024-05-23 NOTE — Patient Instructions (Signed)

## 2024-05-29 ENCOUNTER — Ambulatory Visit: Payer: Self-pay | Admitting: Physical Therapy

## 2024-05-29 ENCOUNTER — Encounter: Payer: Self-pay | Admitting: Physical Therapy

## 2024-05-29 DIAGNOSIS — M6281 Muscle weakness (generalized): Secondary | ICD-10-CM | POA: Diagnosis not present

## 2024-05-29 DIAGNOSIS — R29898 Other symptoms and signs involving the musculoskeletal system: Secondary | ICD-10-CM | POA: Diagnosis not present

## 2024-05-29 DIAGNOSIS — M25552 Pain in left hip: Secondary | ICD-10-CM | POA: Diagnosis not present

## 2024-05-29 DIAGNOSIS — M79605 Pain in left leg: Secondary | ICD-10-CM | POA: Diagnosis not present

## 2024-05-29 NOTE — Therapy (Signed)
 OUTPATIENT PHYSICAL THERAPY TREATMENT   Patient Name: Mandy Holmes MRN: [id number] DOB:[birth year], 73 y.o., female Today's Date: 05/29/2024  END OF SESSION:  PT End of Session - 05/29/24 0804     Visit Number 2    Number of Visits 12    Date for PT Re-Evaluation 07/04/24    Authorization Type [employer]    Progress Note Due on Visit 10    PT Start Time 0801    PT Stop Time 0840    PT Time Calculation (min) 39 min    Activity Tolerance Patient tolerated treatment well    Behavior During Therapy [facility] for tasks assessed/performed           Past Medical History:  Diagnosis Date   Basal cell carcinoma 07/07/2020   right tips of nose -(MOHS) Dr. Karis   Cancer [facility])    endometrial   Constipation    Headache(784.0)    migraines   Hypothyroidism    Migraine    Sleep apnea    CPAP   Venous insufficiency    bilateral lower legs   Past Surgical History:  Procedure Laterality Date   ABDOMINAL HYSTERECTOMY     APPENDECTOMY     KNEE ARTHROSCOPY Left    ROBOTIC ASSISTED TOTAL HYSTERECTOMY WITH BILATERAL SALPINGO OOPHERECTOMY N/A 12/03/2013   Procedure: ROBOTIC ASSISTED TOTAL HYSTERECTOMY WITH BILATERAL SALPINGO OOPHORECTOMY WITH BILATERAL LYMPH NODE DISSECTION;  Surgeon: Elenore A. Dodie, MD;  Location: [facility];  Service: Gynecology;  Laterality: N/A;   TUBAL LIGATION     VENTRAL HERNIA REPAIR N/A 07/29/2021   Procedure: LAPAROSCOPIC VENTRAL HERNIA REPAIR WITH MESH;  Surgeon: Ebbie Cough, MD;  Location: [facility];  Service: General;  Laterality: N/A;   WISDOM TOOTH EXTRACTION     Patient Active Problem List   Diagnosis Date Noted   OSA on CPAP 01/20/2021   Class 1 obesity due to excess calories without serious comorbidity with body mass index (BMI) of 31.0 to 31.9 in adult 01/29/2018   Hypercalcemia 09/15/2017   Elevated blood pressure reading 09/15/2017   Dyspareunia, female 12/22/2015   Preventative health care 05/19/2015   History of estrogen  deficiency 05/19/2015   Chronic fatigue 05/19/2015   Hypothyroidism 05/19/2015   History of uterine cancer 11/07/2013    PCP: Hughie Sharper, MD  REFERRING PROVIDER: Gretta Bertrum ORN, PA-C  REFERRING DIAG: [phone] (ICD-10-CM) - Pain in left hip  Rationale for Evaluation and Treatment: Rehabilitation  THERAPY DIAG:  Pain in left hip  Pain in left leg  Muscle weakness (generalized)  Other symptoms and signs involving the musculoskeletal system  ONSET DATE: 01/13/24   SUBJECTIVE:  SUBJECTIVE STATEMENT: She is sleeping better, and pain is much better.  Pain is more centralized   PERTINENT HISTORY:  Hx endometrial cancer, migraines, OSA, venous insufficiency  PAIN:  Are you having pain? Yes: NPRS scale: 7/10 always Pain location: Lt hip/buttock/hamstring Pain description: ripping, pulling, sharp Aggravating factors: standing (unable to stand longer than a few seconds) Relieving factors: injection helped briefly  PRECAUTIONS:  None  RED FLAGS: None   WEIGHT BEARING RESTRICTIONS:  No  FALLS:  Has patient fallen in last 6 months? No  LIVING ENVIRONMENT: Lives with: lives with their spouse Lives in: House/apartment Stairs: Yes: External: 3 steps; on left going up (does need help with stairs) Has following equipment at home: Single point cane  OCCUPATION:  Retired   PLOF:  Independent and Leisure: household chores, reading, walking  PATIENT GOALS:  Improve pain, be able to return to regular activities   OBJECTIVE:  Note: Objective measures were completed at Evaluation unless otherwise noted.  DIAGNOSTIC FINDINGS:  MRI Lumbar Spine: Progression of degenerative disc desiccation with mild caudal foraminal narrowing at L3-4 and L4-5. No high-grade foraminal or spinal stenosis  is present.   Mild grade 1 anterior spondylolisthesis of L5 on S1 secondary to moderate to severe facet arthrosis. There is crowding of the descending nerve roots in the lateral recess, left greater than right. Correlation for mild left S1 radicular symptoms. 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 Mild hip arthritis.  PATIENT SURVEYS:  Patient-Specific Activity Scoring Scheme  0 represents "unable to perform." 10 represents "able to perform at prior level. 0 1 2 3 4 5 6 7 8 9  10 (Date and Score)   Activity Eval     1. Sitting on Lt side 2    2. Standing 2    3. Lying down 5   Score 3    Total score = sum of the activity scores/number of activities Minimum detectable change (90%CI) for average score = 2 points Minimum detectable change (90%CI) for single activity score = 3 points    COGNITIVE STATUS: Within functional limits for tasks assessed   SENSATION: WFL  POSTURE:  Rt lateral shift in standing   GAIT: 05/23/24 Distance walked: 100' within clinic Assistive device utilized: Single point cane Level of assistance: Modified independence Comments: antalgic gait with decreased stance on LLE   PALPATION: 05/23/24 trigger points and tenderness throughout glutes, hamstring   LUMBAR ROM:  pain all directions, no increase with quadrant testing  ROM A/PROM  eval  Flexion WNL with pain  Extension WNL with pain  Right quadrant WNL  Left quadrant WNL   (Blank rows = not tested)   LOWER EXTREMITY MMT:    MMT Right eval Left eval  Hip flexion 3+/5 3+/5  Hip extension  3/5  Hip abduction  3/5  Hip adduction    Hip internal rotation    Hip external rotation    Knee flexion  3+/5  Knee extension     (Blank rows = not tested)    SPECIAL TESTS:  05/23/24 Lumbar Slump test: no change in pain with testing; increased pain with decreased neural tension  TREATMENT:  DATE:  05/29/24 Neuro Re Ed Rt lateral shift correction 10 x 5 sec hold Standing lumbar extension 10x5 sec hold Segmental sciatic nerve glides at hip/knee/ankle 10 x 5 sec hold each   TherEx Isometric hip abduction with belt 10x5 sec hold bil Bridges with belt 10x5 sec hold LLE isometric hip extension 10x5 sec hold Piriformis stretch 3x15 sec hold; knee to opp shoulder  05/23/24 TherEx See HEP - demonstrated with trial reps performed PRN, mod cues for comprehension    Self Care Educated on PT POC and clinical findings, discussed possibility of DN   Manual Tennis ball to Lt hamstrings and glutes/piriformis    PATIENT EDUCATION:  Education details: HEP, DN Person educated: Patient Education method: Explanation, Demonstration, and Handouts Education comprehension: verbalized understanding, returned demonstration, and needs further education  HOME EXERCISE PROGRAM: Access Code: [email or web address] URL: [email or web address] Date: 05/23/2024 Prepared by: Corean Ku  Exercises - Hip Neural Slide/Glide  - 2 x daily - 7 x weekly - 1 sets - 10 reps - 5-10 sec hold - Knee Nerve Slide/Glide  - 2 x daily - 7 x weekly - 1 sets - 10 reps - 5-10  sec hold - Ankle Slide/Glide  - 2 x daily - 7 x weekly - 1 sets - 10 reps - 5-10 sec hold - Right Standing Lateral Shift Correction at Wall - Repetitions  - 2-3 x daily - 7 x weekly - 1 sets - 10 reps - 10 sec hold - Standing Lumbar Extension at Wall - Forearms  - 2-3 x daily - 7 x weekly - 1 sets - 10 reps - 10 sec hold  8/20: Holding these exercises for now: - Self Release   - 2-3 x daily - 7 x weekly - 1 sets - 1 reps - 2-3 min hold - IT Band Mobilization with Small Ball  - 2-3 x daily - 7 x weekly - 1 sets - 1 reps - 2-3 min hold   ASSESSMENT:  CLINICAL IMPRESSION: Overall pt reporting improvement in pain and symptoms, she is sitting and  sleeping better at this time.  Will continue to benefit from PT to maximize function.    OBJECTIVE IMPAIRMENTS: Abnormal gait, decreased coordination, decreased mobility, difficulty walking, decreased strength, increased fascial restrictions, increased muscle spasms, impaired flexibility, postural dysfunction, and pain.   ACTIVITY LIMITATIONS: carrying, lifting, bending, sitting, standing, sleeping, stairs, transfers, bed mobility, and locomotion level  PARTICIPATION LIMITATIONS: meal prep, cleaning, laundry, driving, shopping, and community activity  PERSONAL FACTORS: Age, Past/current experiences, Time since onset of injury/illness/exacerbation, and 3+ comorbidities: Hx endometrial cancer, migraines, OSA, venous insufficiency are also affecting patient's functional outcome.   REHAB POTENTIAL: Good  CLINICAL DECISION MAKING: Evolving/moderate complexity  EVALUATION COMPLEXITY: Moderate   GOALS: Goals reviewed with patient? Yes  SHORT TERM GOALS: Target date: 06/13/2024  Independent with initial HEP Goal status: INITIAL   LONG TERM GOALS: Target date: 07/04/2024  Independent with final HEP Goal status: INITIAL  2.  PSFS score improved by 3 points Goal status: INITIAL  3.  Lt hip strength improved to 4/5 for improved function Goal status: INIITAL  4.  Report pain < 4/10 with sitting and standing for improved function Goal status: INITIAL  5.  Report ability to perform housework at least 50% capacity for improved function Goal status: INITIAL   PLAN:  PT FREQUENCY: 1-2x/week  PT DURATION: 6 weeks  PLANNED INTERVENTIONS: 97164- PT Re-evaluation, 97750- Physical Performance Testing, 97110-Therapeutic exercises, 97530- Therapeutic activity, [id number]- Neuromuscular re-education, 97535-  Self Care, 02859- Manual therapy, [phone]- Gait training, [phone]- Aquatic Therapy, [phone]- Electrical stimulation (unattended), [phone]- Traction (mechanical), [id number]- Ionotophoresis 4mg /ml  Dexamethasone , 20560 (1-2 muscles), 20561 (3+ muscles)- Dry Needling, Patient/Family education, Balance training, Stair training, Taping, Spinal manipulation, Spinal mobilization, Cryotherapy, and Moist heat.  PLAN FOR NEXT SESSION: Review HEP PRN, consider DN to hamstring/glutes/piriformis (held for now), hip strengthening as tolerated, can give SI stab exercises next visit if requested   NEXT MD VISIT: 06/20/24   Corean JULIANNA Ku, PT, DPT 05/29/24 8:42 AM

## 2024-06-06 ENCOUNTER — Ambulatory Visit: Admitting: Rehabilitative and Restorative Service Providers"

## 2024-06-06 DIAGNOSIS — M25552 Pain in left hip: Secondary | ICD-10-CM | POA: Diagnosis not present

## 2024-06-06 DIAGNOSIS — R29898 Other symptoms and signs involving the musculoskeletal system: Secondary | ICD-10-CM | POA: Diagnosis not present

## 2024-06-06 DIAGNOSIS — M79605 Pain in left leg: Secondary | ICD-10-CM

## 2024-06-06 DIAGNOSIS — M6281 Muscle weakness (generalized): Secondary | ICD-10-CM

## 2024-06-06 NOTE — Therapy (Signed)
 OUTPATIENT PHYSICAL THERAPY TREATMENT   Patient Name: Mandy Holmes MRN: [id number] DOB:1951-01-19, 73 y.o., female Today's Date: 06/06/2024  END OF SESSION:  PT End of Session - 06/06/24 1624     Visit Number 3    Number of Visits 12    Date for PT Re-Evaluation 07/04/24    Authorization Type [employer]    Progress Note Due on Visit 10    PT Start Time 1105    PT Stop Time 1145    PT Time Calculation (min) 40 min    Activity Tolerance Patient tolerated treatment well;No increased pain;Patient limited by pain    Behavior During Therapy [facility] for tasks assessed/performed            Past Medical History:  Diagnosis Date   Basal cell carcinoma 07/07/2020   right tips of nose -([facility]) Dr. Karis   Cancer [facility])    endometrial   Constipation    Headache(784.0)    migraines   Hypothyroidism    Migraine    Sleep apnea    CPAP   Venous insufficiency    bilateral lower legs   Past Surgical History:  Procedure Laterality Date   ABDOMINAL HYSTERECTOMY     APPENDECTOMY     KNEE ARTHROSCOPY Left    ROBOTIC ASSISTED TOTAL HYSTERECTOMY WITH BILATERAL SALPINGO OOPHERECTOMY N/A 12/03/2013   Procedure: ROBOTIC ASSISTED TOTAL HYSTERECTOMY WITH BILATERAL SALPINGO OOPHORECTOMY WITH BILATERAL LYMPH NODE DISSECTION;  Surgeon: Mandy A. Dodie, MD;  Location: [facility];  Service: Gynecology;  Laterality: N/A;   TUBAL LIGATION     VENTRAL HERNIA REPAIR N/A 07/29/2021   Procedure: LAPAROSCOPIC VENTRAL HERNIA REPAIR WITH MESH;  Surgeon: Mandy Cough, MD;  Location: [facility];  Service: General;  Laterality: N/A;   WISDOM TOOTH EXTRACTION     Patient Active Problem List   Diagnosis Date Noted   OSA on CPAP 01/20/2021   Class 1 obesity due to excess calories without serious comorbidity with body mass index (BMI) of 31.0 to 31.9 in adult 01/29/2018   Hypercalcemia 09/15/2017   Elevated blood pressure reading 09/15/2017   Dyspareunia, female 12/22/2015   Preventative health  care 05/19/2015   History of estrogen deficiency 05/19/2015   Chronic fatigue 05/19/2015   Hypothyroidism 05/19/2015   History of uterine cancer 11/07/2013    PCP: Mandy Sharper, MD  REFERRING PROVIDER: Gretta Holmes ORN, PA-C  REFERRING DIAG: [phone] (ICD-10-CM) - Pain in left hip  Rationale for Evaluation and Treatment: Rehabilitation  THERAPY DIAG:  Pain in left hip  Pain in left leg  Muscle weakness (generalized)  Other symptoms and signs involving the musculoskeletal system  ONSET DATE: 01/13/24   SUBJECTIVE:  SUBJECTIVE STATEMENT: Mandy Holmes notes significant progress since starting physical therapy.  She did note that things have been more difficult over the last day or 2 and she notes difficulty with prolonged sitting.  PERTINENT HISTORY:  Hx endometrial cancer, migraines, OSA, venous insufficiency  PAIN:  Are you having pain? Yes: NPRS scale: 3-7/10 (was 7/10 always) Pain location: Lt hip/buttock/hamstrings Pain description: ripping, pulling, sharp Aggravating factors: Prolonged postures including sitting and standing (was unable to stand longer than a few seconds) Relieving factors: injection helped briefly, significant early progress with physical therapy exercises  PRECAUTIONS:  None  RED FLAGS: None   WEIGHT BEARING RESTRICTIONS:  No  FALLS:  Has patient fallen in last 6 months? No  LIVING ENVIRONMENT: Lives with: lives with their spouse Lives in: House/apartment Stairs: Yes: External: 3 steps; on left going up (does need help with stairs) Has following equipment at home: Single point cane  OCCUPATION:  Retired   PLOF:  Independent and Leisure: household chores, reading, walking  PATIENT GOALS:  Improve pain, be able to return to regular  activities   OBJECTIVE:  Note: Objective measures were completed at Evaluation unless otherwise noted.  DIAGNOSTIC FINDINGS:  MRI Lumbar Spine: Progression of degenerative disc desiccation with mild caudal foraminal narrowing at L3-4 and L4-5. No high-grade foraminal or spinal stenosis is present.   Mild grade 1 anterior spondylolisthesis of L5 on S1 secondary to moderate to severe facet arthrosis. There is crowding of the descending nerve roots in the lateral recess, left greater than right. Correlation for mild left S1 radicular symptoms. 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 Mild hip arthritis.  PATIENT SURVEYS:  Patient-Specific Activity Scoring Scheme  0 represents "unable to perform." 10 represents "able to perform at prior level. 0 1 2 3 4 5 6 7 8 9  10 (Date and Score)   Activity Eval     1. Sitting on Lt side 2    2. Standing 2    3. Lying down 5   Score 3    Total score = sum of the activity scores/number of activities Minimum detectable change (90%CI) for average score = 2 points Minimum detectable change (90%CI) for single activity score = 3 points    COGNITIVE STATUS: Within functional limits for tasks assessed   SENSATION: WFL  POSTURE:  Rt lateral shift in standing   GAIT: 05/23/24 Distance walked: 100' within clinic Assistive device utilized: Single point cane Level of assistance: Modified independence Comments: antalgic gait with decreased stance on LLE   PALPATION: 05/23/24 trigger points and tenderness throughout glutes, hamstring   LUMBAR ROM:  pain all directions, no increase with quadrant testing  ROM A/PROM  eval 06/06/2024 Left/Right  Flexion WNL with pain   Extension WNL with pain   Right quadrant WNL   Left quadrant WNL   Hip flexors  100/100  Hip ER  30/25  Hip IR  10/12  Hamstrings  35/50   (Blank rows = not tested)   LOWER EXTREMITY MMT:    MMT Right eval  Left eval  Hip flexion 3+/5 3+/5  Hip extension  3/5  Hip abduction  3/5  Hip adduction    Hip internal rotation    Hip external rotation    Knee flexion  3+/5  Knee extension     (Blank rows = not tested)    SPECIAL TESTS:  05/23/24 Lumbar Slump test: no change in pain with testing; increased  pain with decreased neural tension  TREATMENT:                                                                                                                              DATE:  06/06/2024 Hamstrings stretch with ankle dorsiflexion and Yoga strap 10 x 15 seconds Left only Figure 4 stretch 5 x 15 seconds each side Yoga Bridge 10 x 5 seconds Lumbar extension AROM (Gentle) 10 x 3 seconds Lateral shift correction 10 x Gentle  97535: Objective assessment of flexibility, review of her current home exercises and slight modifications made to address left hamstrings and bilateral hip external rotation active range of motion impairments   05/29/24 Neuro Re Ed Rt lateral shift correction 10 x 5 sec hold Standing lumbar extension 10x5 sec hold Segmental sciatic nerve glides at hip/knee/ankle 10 x 5 sec hold each   TherEx Isometric hip abduction with belt 10x5 sec hold bil Bridges with belt 10x5 sec hold LLE isometric hip extension 10x5 sec hold Piriformis stretch 3x15 sec hold; knee to opp shoulder   05/23/24 TherEx See HEP - demonstrated with trial reps performed PRN, mod cues for comprehension    Self Care Educated on PT POC and clinical findings, discussed possibility of DN   Manual Tennis ball to Lt hamstrings and glutes/piriformis    PATIENT EDUCATION:  Education details: HEP, DN Person educated: Patient Education method: Explanation, Demonstration, and Handouts Education comprehension: verbalized understanding, returned demonstration, and needs further education  HOME EXERCISE PROGRAM: Access Code: [email or web address] URL: [email or web address] Date:  06/06/2024 Prepared by: Lamar Ivory  Exercises - Hip Neural Slide/Glide  - 2 x daily - 1 x weekly - 1 sets - 10 reps - 5-10 sec hold - Knee Nerve Slide/Glide  - 2 x daily - 7 x weekly - 1 sets - 10 reps - 15  sec hold - Ankle Slide/Glide  - 2 x daily - 1 x weekly - 1 sets - 10 reps - 5-10 sec hold - Right Standing Lateral Shift Correction at Wall - Repetitions  - 2-3 x daily - 7 x weekly - 1 sets - 10 reps - 10 sec hold - Standing Lumbar Extension at Wall - Forearms  - 2-3 x daily - 7 x weekly - 1 sets - 10 reps - 10 sec hold - Self Release   - 2-3 x daily - 1 x weekly - 1 sets - 1 reps - 2-3 min hold - IT Band Mobilization with Small Ball  - 2-3 x daily - 1 x weekly - 1 sets - 1 reps - 2-3 min hold - Supine Figure 4 Piriformis Stretch  - 2 x daily - 7 x weekly - 1 sets - 5 reps - 15 seconds hold - Yoga Bridge  - 2 x daily - 7 x weekly - 1 sets - 10 reps - 5 seconds hold  8/20: Holding these exercises for now: - Self Release   - 2-3  x daily - 7 x weekly - 1 sets - 1 reps - 2-3 min hold - IT Band Mobilization with Small Ball  - 2-3 x daily - 7 x weekly - 1 sets - 1 reps - 2-3 min hold   ASSESSMENT:  CLINICAL IMPRESSION: Tashika notes significant overall progress since starting physical therapy.  However, the last few days she has had more pain, not at the level that she had at evaluation but more so than she had previously.  Objective assessment showed significant tightness of the left hamstrings and bilateral hip external rotators.  She also had a positive Ober's test on the left side.  All these impairments are being addressed with her current home exercises and she will benefit from a strength reassessment in the near future as today's focus was more on her current exercises and flexibility impairments.  OBJECTIVE IMPAIRMENTS: Abnormal gait, decreased coordination, decreased mobility, difficulty walking, decreased strength, increased fascial restrictions, increased muscle spasms, impaired  flexibility, postural dysfunction, and pain.   ACTIVITY LIMITATIONS: carrying, lifting, bending, sitting, standing, sleeping, stairs, transfers, bed mobility, and locomotion level  PARTICIPATION LIMITATIONS: meal prep, cleaning, laundry, driving, shopping, and community activity  PERSONAL FACTORS: Age, Past/current experiences, Time since onset of injury/illness/exacerbation, and 3+ comorbidities: Hx endometrial cancer, migraines, OSA, venous insufficiency are also affecting patient's functional outcome.   REHAB POTENTIAL: Good  CLINICAL DECISION MAKING: Evolving/moderate complexity  EVALUATION COMPLEXITY: Moderate   GOALS: Goals reviewed with patient? Yes  SHORT TERM GOALS: Target date: 06/13/2024  Independent with initial HEP Goal status: Met 06/06/2024   LONG TERM GOALS: Target date: 07/04/2024  Independent with final HEP Goal status: INITIAL  2.  PSFS score improved by 3 points Goal status: INITIAL  3.  Lt hip strength improved to 4/5 for improved function Goal status: INIITAL  4.  Report pain < 4/10 with sitting and standing for improved function Goal status: INITIAL  5.  Report ability to perform housework at least 50% capacity for improved function Goal status: INITIAL   PLAN:  PT FREQUENCY: 1-2x/week  PT DURATION: 6 weeks  PLANNED INTERVENTIONS: 97164- PT Re-evaluation, 97750- Physical Performance Testing, 97110-Therapeutic exercises, 97530- Therapeutic activity, 97112- Neuromuscular re-education, 97535- Self Care, 02859- Manual therapy, [phone]- Gait training, [phone]- Aquatic Therapy, [phone]- Electrical stimulation (unattended), [phone]- Traction (mechanical), [id number]- Ionotophoresis 4mg /ml Dexamethasone , 79439 (1-2 muscles), 20561 (3+ muscles)- Dry Needling, Patient/Family education, Balance training, Stair training, Taping, Spinal manipulation, Spinal mobilization, Cryotherapy, and Moist heat.  PLAN FOR NEXT SESSION: Review HEP, consider DN to  hamstrings/glutes/piriformis (held for now), hip strengthening as appropriate, can give SI stab exercises next visit if requested.  How are her new hip external rotation and hamstrings flexibility activities progressing?   NEXT MD VISIT: 06/20/24   Myer LELON Ivory, PT, MPT 06/06/24 4:30 PM

## 2024-06-14 ENCOUNTER — Ambulatory Visit: Admitting: Physical Therapy

## 2024-06-14 ENCOUNTER — Encounter: Payer: Self-pay | Admitting: Physical Therapy

## 2024-06-14 DIAGNOSIS — M25552 Pain in left hip: Secondary | ICD-10-CM | POA: Diagnosis not present

## 2024-06-14 DIAGNOSIS — M6281 Muscle weakness (generalized): Secondary | ICD-10-CM

## 2024-06-14 DIAGNOSIS — M79605 Pain in left leg: Secondary | ICD-10-CM | POA: Diagnosis not present

## 2024-06-14 DIAGNOSIS — R29898 Other symptoms and signs involving the musculoskeletal system: Secondary | ICD-10-CM

## 2024-06-14 NOTE — Patient Instructions (Signed)

## 2024-06-14 NOTE — Therapy (Signed)
 OUTPATIENT PHYSICAL THERAPY TREATMENT   Patient Name: Mandy Holmes MRN: [id number] DOB:07-Oct-1951, 73 y.o., female Today's Date: 06/14/2024  END OF SESSION:  PT End of Session - 06/14/24 0847     Visit Number 4    Number of Visits 12    Date for PT Re-Evaluation 07/04/24    Authorization Type [employer]    Progress Note Due on Visit 10    PT Start Time 0845    PT Stop Time 0925    PT Time Calculation (min) 40 min    Activity Tolerance Patient tolerated treatment well;No increased pain;Patient limited by pain    Behavior During Therapy [facility] for tasks assessed/performed             Past Medical History:  Diagnosis Date   Basal cell carcinoma 07/07/2020   right tips of nose -([facility]) Dr. Karis   Cancer [facility])    endometrial   Constipation    Headache(784.0)    migraines   Hypothyroidism    Migraine    Sleep apnea    CPAP   Venous insufficiency    bilateral lower legs   Past Surgical History:  Procedure Laterality Date   ABDOMINAL HYSTERECTOMY     APPENDECTOMY     KNEE ARTHROSCOPY Left    ROBOTIC ASSISTED TOTAL HYSTERECTOMY WITH BILATERAL SALPINGO OOPHERECTOMY N/A 12/03/2013   Procedure: ROBOTIC ASSISTED TOTAL HYSTERECTOMY WITH BILATERAL SALPINGO OOPHORECTOMY WITH BILATERAL LYMPH NODE DISSECTION;  Surgeon: Elenore A. Dodie, MD;  Location: [facility];  Service: Gynecology;  Laterality: N/A;   TUBAL LIGATION     VENTRAL HERNIA REPAIR N/A 07/29/2021   Procedure: LAPAROSCOPIC VENTRAL HERNIA REPAIR WITH MESH;  Surgeon: Ebbie Cough, MD;  Location: [facility];  Service: General;  Laterality: N/A;   WISDOM TOOTH EXTRACTION     Patient Active Problem List   Diagnosis Date Noted   OSA on CPAP 01/20/2021   Class 1 obesity due to excess calories without serious comorbidity with body mass index (BMI) of 31.0 to 31.9 in adult 01/29/2018   Hypercalcemia 09/15/2017   Elevated blood pressure reading 09/15/2017   Dyspareunia, female 12/22/2015   Preventative health  care 05/19/2015   History of estrogen deficiency 05/19/2015   Chronic fatigue 05/19/2015   Hypothyroidism 05/19/2015   History of uterine cancer 11/07/2013    PCP: Hughie Sharper, MD  REFERRING PROVIDER: Gretta Bertrum ORN, PA-C  REFERRING DIAG: [phone] (ICD-10-CM) - Pain in left hip  Rationale for Evaluation and Treatment: Rehabilitation  THERAPY DIAG:  Pain in left hip  Pain in left leg  Muscle weakness (generalized)  Other symptoms and signs involving the musculoskeletal system  ONSET DATE: 01/13/24   SUBJECTIVE:  SUBJECTIVE STATEMENT: Had to stop doing new exercises due to hip pain.  Feels like symptoms have plateaued.     PERTINENT HISTORY:  Hx endometrial cancer, migraines, OSA, venous insufficiency  PAIN:  Are you having pain? Yes: NPRS scale: 3-7/10 (was 7/10 always) Pain location: Lt hip/buttock/hamstrings Pain description: ripping, pulling, sharp Aggravating factors: Prolonged postures including sitting and standing (was unable to stand longer than a few seconds) Relieving factors: injection helped briefly, significant early progress with physical therapy exercises  PRECAUTIONS:  None  RED FLAGS: None   WEIGHT BEARING RESTRICTIONS:  No  FALLS:  Has patient fallen in last 6 months? No  LIVING ENVIRONMENT: Lives with: lives with their spouse Lives in: House/apartment Stairs: Yes: External: 3 steps; on left going up (does need help with stairs) Has following equipment at home: Single point cane  OCCUPATION:  Retired   PLOF:  Independent and Leisure: household chores, reading, walking  PATIENT GOALS:  Improve pain, be able to return to regular activities   OBJECTIVE:  Note: Objective measures were completed at Evaluation unless otherwise noted.  DIAGNOSTIC  FINDINGS:  MRI Lumbar Spine: Progression of degenerative disc desiccation with mild caudal foraminal narrowing at L3-4 and L4-5. No high-grade foraminal or spinal stenosis is present.   Mild grade 1 anterior spondylolisthesis of L5 on S1 secondary to moderate to severe facet arthrosis. There is crowding of the descending nerve roots in the lateral recess, left greater than right. Correlation for mild left S1 radicular symptoms. 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 Mild hip arthritis.  PATIENT SURVEYS:  Patient-Specific Activity Scoring Scheme  0 represents "unable to perform." 10 represents "able to perform at prior level. 0 1 2 3 4 5 6 7 8 9  10 (Date and Score)   Activity Eval     1. Sitting on Lt side 2    2. Standing 2    3. Lying down 5   Score 3    Total score = sum of the activity scores/number of activities Minimum detectable change (90%CI) for average score = 2 points Minimum detectable change (90%CI) for single activity score = 3 points    COGNITIVE STATUS: Within functional limits for tasks assessed   SENSATION: WFL  POSTURE:  Rt lateral shift in standing   GAIT: 05/23/24 Distance walked: 100' within clinic Assistive device utilized: Single point cane Level of assistance: Modified independence Comments: antalgic gait with decreased stance on LLE   PALPATION: 05/23/24 trigger points and tenderness throughout glutes, hamstring   LUMBAR ROM:  pain all directions, no increase with quadrant testing  ROM A/PROM  eval 06/06/2024 Left/Right  Flexion WNL with pain   Extension WNL with pain   Right quadrant WNL   Left quadrant WNL   Hip flexors  100/100  Hip ER  30/25  Hip IR  10/12  Hamstrings  35/50   (Blank rows = not tested)   LOWER EXTREMITY MMT:    MMT Right eval Left eval  Hip flexion 3+/5 3+/5  Hip extension  3/5  Hip abduction  3/5  Hip adduction    Hip internal rotation    Hip  external rotation    Knee flexion  3+/5  Knee extension     (Blank rows = not tested)    SPECIAL TESTS:  05/23/24 Lumbar Slump test: no change in pain with testing; increased pain with decreased neural tension  TREATMENT:  DATE:  06/14/24 TherEx Discussed exercises and pt's response to new exercises.  Advised to hold on new ones as they seem to be leading to increased hip pain - pt verbalized understanding  Manual STM with compression to Lt proximal/lateral hamstring; skilled palpation and monitoring of soft tissue during dry needling  Trigger Point Dry Needling  Initial Treatment: Pt instructed on Dry Needling rational, procedures, and possible side effects. Pt instructed to expect mild to moderate muscle soreness later in the day and/or into the next day.  Pt instructed in methods to reduce muscle soreness. Pt instructed to continue prescribed HEP. Patient was educated on signs and symptoms of infection and other risk factors and advised to seek medical attention should they occur.  Patient verbalized understanding of these instructions and education.   Patient Verbal Consent Given: Yes Education Handout Provided: Yes Muscles Treated: Lt proximal/lateral hamstring Electrical Stimulation Performed: Yes, Parameters: 10 mA freq with intensity to tolerance x 5 min Treatment Response/Outcome: twitch response with decreased pain following session    06/06/2024 Hamstrings stretch with ankle dorsiflexion and Yoga strap 10 x 15 seconds Left only Figure 4 stretch 5 x 15 seconds each side Yoga Bridge 10 x 5 seconds Lumbar extension AROM (Gentle) 10 x 3 seconds Lateral shift correction 10 x Gentle  97535: Objective assessment of flexibility, review of her current home exercises and slight modifications made to address left hamstrings and bilateral hip external  rotation active range of motion impairments   05/29/24 Neuro Re Ed Rt lateral shift correction 10 x 5 sec hold Standing lumbar extension 10x5 sec hold Segmental sciatic nerve glides at hip/knee/ankle 10 x 5 sec hold each   TherEx Isometric hip abduction with belt 10x5 sec hold bil Bridges with belt 10x5 sec hold LLE isometric hip extension 10x5 sec hold Piriformis stretch 3x15 sec hold; knee to opp shoulder   05/23/24 TherEx See HEP - demonstrated with trial reps performed PRN, mod cues for comprehension    Self Care Educated on PT POC and clinical findings, discussed possibility of DN   Manual Tennis ball to Lt hamstrings and glutes/piriformis    PATIENT EDUCATION:  Education details: HEP, DN Person educated: Patient Education method: Explanation, Demonstration, and Handouts Education comprehension: verbalized understanding, returned demonstration, and needs further education  HOME EXERCISE PROGRAM: Access Code: [email or web address] URL: [email or web address] Date: 06/06/2024 Prepared by: Lamar Ivory  Exercises - Hip Neural Slide/Glide  - 2 x daily - 1 x weekly - 1 sets - 10 reps - 5-10 sec hold - Knee Nerve Slide/Glide  - 2 x daily - 7 x weekly - 1 sets - 10 reps - 15  sec hold - Ankle Slide/Glide  - 2 x daily - 1 x weekly - 1 sets - 10 reps - 5-10 sec hold - Right Standing Lateral Shift Correction at Wall - Repetitions  - 2-3 x daily - 7 x weekly - 1 sets - 10 reps - 10 sec hold - Standing Lumbar Extension at Wall - Forearms  - 2-3 x daily - 7 x weekly - 1 sets - 10 reps - 10 sec hold - Self Release   - 2-3 x daily - 1 x weekly - 1 sets - 1 reps - 2-3 min hold - IT Band Mobilization with Small Ball  - 2-3 x daily - 1 x weekly - 1 sets - 1 reps - 2-3 min hold - Supine Figure 4 Piriformis Stretch  - 2 x daily - 7 x weekly - 1  sets - 5 reps - 15 seconds hold - Yoga Bridge  - 2 x daily - 7 x weekly - 1 sets - 10 reps - 5 seconds hold  8/20: Holding these exercises  for now: - Self Release   - 2-3 x daily - 7 x weekly - 1 sets - 1 reps - 2-3 min hold - IT Band Mobilization with Small Ball  - 2-3 x daily - 7 x weekly - 1 sets - 1 reps - 2-3 min hold   ASSESSMENT:  CLINICAL IMPRESSION: Pt tolerated session well reporting reduction in pain after session.  Did trial DN today to see if this helps to improve symptoms.  Will continue to benefit from PT to maximize function.   OBJECTIVE IMPAIRMENTS: Abnormal gait, decreased coordination, decreased mobility, difficulty walking, decreased strength, increased fascial restrictions, increased muscle spasms, impaired flexibility, postural dysfunction, and pain.   ACTIVITY LIMITATIONS: carrying, lifting, bending, sitting, standing, sleeping, stairs, transfers, bed mobility, and locomotion level  PARTICIPATION LIMITATIONS: meal prep, cleaning, laundry, driving, shopping, and community activity  PERSONAL FACTORS: Age, Past/current experiences, Time since onset of injury/illness/exacerbation, and 3+ comorbidities: Hx endometrial cancer, migraines, OSA, venous insufficiency are also affecting patient's functional outcome.   REHAB POTENTIAL: Good  CLINICAL DECISION MAKING: Evolving/moderate complexity  EVALUATION COMPLEXITY: Moderate   GOALS: Goals reviewed with patient? Yes  SHORT TERM GOALS: Target date: 06/13/2024  Independent with initial HEP Goal status: Met 06/06/2024   LONG TERM GOALS: Target date: 07/04/2024  Independent with final HEP Goal status: INITIAL  2.  PSFS score improved by 3 points Goal status: INITIAL  3.  Lt hip strength improved to 4/5 for improved function Goal status: INIITAL  4.  Report pain < 4/10 with sitting and standing for improved function Goal status: INITIAL  5.  Report ability to perform housework at least 50% capacity for improved function Goal status: INITIAL   PLAN:  PT FREQUENCY: 1-2x/week  PT DURATION: 6 weeks  PLANNED INTERVENTIONS: 97164- PT  Re-evaluation, 97750- Physical Performance Testing, 97110-Therapeutic exercises, 97530- Therapeutic activity, 97112- Neuromuscular re-education, 97535- Self Care, [id number]- Manual therapy, [phone]- Gait training, [phone]- Aquatic Therapy, [phone]- Electrical stimulation (unattended), [phone]- Traction (mechanical), [id number]- Ionotophoresis 4mg /ml Dexamethasone , 79439 (1-2 muscles), 20561 (3+ muscles)- Dry Needling, Patient/Family education, Balance training, Stair training, Taping, Spinal manipulation, Spinal mobilization, Cryotherapy, and Moist heat.  PLAN FOR NEXT SESSION:  Review HEP PRN, assess response to DN, hip strengthening as appropriate, can give SI stab exercises next visit if requested.    NEXT MD VISIT: 06/20/24   Corean JULIANNA Ku, PT, DPT 06/14/24 10:03 AM

## 2024-06-19 ENCOUNTER — Ambulatory Visit: Admitting: Physical Therapy

## 2024-06-19 ENCOUNTER — Encounter: Payer: Self-pay | Admitting: Physical Therapy

## 2024-06-19 DIAGNOSIS — M79605 Pain in left leg: Secondary | ICD-10-CM

## 2024-06-19 DIAGNOSIS — R29898 Other symptoms and signs involving the musculoskeletal system: Secondary | ICD-10-CM

## 2024-06-19 DIAGNOSIS — M25552 Pain in left hip: Secondary | ICD-10-CM

## 2024-06-19 DIAGNOSIS — M6281 Muscle weakness (generalized): Secondary | ICD-10-CM | POA: Diagnosis not present

## 2024-06-19 NOTE — Therapy (Signed)
 OUTPATIENT PHYSICAL THERAPY TREATMENT   Patient Name: Mandy Holmes MRN: [id number] DOB:1951-09-11, 73 y.o., female Today's Date: 06/19/2024  END OF SESSION:  PT End of Session - 06/19/24 0849     Visit Number 5    Number of Visits 12    Date for PT Re-Evaluation 07/04/24    Authorization Type [employer]    Progress Note Due on Visit 10    PT Start Time 0848    PT Stop Time 0926    PT Time Calculation (min) 38 min    Activity Tolerance Patient tolerated treatment well;No increased pain;Patient limited by pain    Behavior During Therapy [facility] for tasks assessed/performed              Past Medical History:  Diagnosis Date   Basal cell carcinoma 07/07/2020   right tips of nose -([facility]) Dr. Karis   Cancer [facility])    endometrial   Constipation    Headache(784.0)    migraines   Hypothyroidism    Migraine    Sleep apnea    CPAP   Venous insufficiency    bilateral lower legs   Past Surgical History:  Procedure Laterality Date   ABDOMINAL HYSTERECTOMY     APPENDECTOMY     KNEE ARTHROSCOPY Left    ROBOTIC ASSISTED TOTAL HYSTERECTOMY WITH BILATERAL SALPINGO OOPHERECTOMY N/A 12/03/2013   Procedure: ROBOTIC ASSISTED TOTAL HYSTERECTOMY WITH BILATERAL SALPINGO OOPHORECTOMY WITH BILATERAL LYMPH NODE DISSECTION;  Surgeon: Elenore A. Dodie, MD;  Location: [facility];  Service: Gynecology;  Laterality: N/A;   TUBAL LIGATION     VENTRAL HERNIA REPAIR N/A 07/29/2021   Procedure: LAPAROSCOPIC VENTRAL HERNIA REPAIR WITH MESH;  Surgeon: Ebbie Cough, MD;  Location: [facility];  Service: General;  Laterality: N/A;   WISDOM TOOTH EXTRACTION     Patient Active Problem List   Diagnosis Date Noted   OSA on CPAP 01/20/2021   Class 1 obesity due to excess calories without serious comorbidity with body mass index (BMI) of 31.0 to 31.9 in adult 01/29/2018   Hypercalcemia 09/15/2017   Elevated blood pressure reading 09/15/2017   Dyspareunia, female 12/22/2015   Preventative  health care 05/19/2015   History of estrogen deficiency 05/19/2015   Chronic fatigue 05/19/2015   Hypothyroidism 05/19/2015   History of uterine cancer 11/07/2013    PCP: Hughie Sharper, MD  REFERRING PROVIDER: Gretta Bertrum ORN, PA-C  REFERRING DIAG: [phone] (ICD-10-CM) - Pain in left hip  Rationale for Evaluation and Treatment: Rehabilitation  THERAPY DIAG:  Pain in left hip  Pain in left leg  Muscle weakness (generalized)  Other symptoms and signs involving the musculoskeletal system  ONSET DATE: 01/13/24   SUBJECTIVE:  SUBJECTIVE STATEMENT: Went to dinner after last session which was fine, but then the next day pain had returned and pretty much needed to stay in bed.  Yesterday and today have improved, didn't need tylenol  this morning.   PERTINENT HISTORY:  Hx endometrial cancer, migraines, OSA, venous insufficiency  PAIN:  Are you having pain? Yes: NPRS scale: 3-7/10 (was 7/10 always) Pain location: Lt hip/buttock/hamstrings Pain description: ripping, pulling, sharp Aggravating factors: Prolonged postures including sitting and standing (was unable to stand longer than a few seconds) Relieving factors: injection helped briefly, significant early progress with physical therapy exercises  PRECAUTIONS:  None  RED FLAGS: None   WEIGHT BEARING RESTRICTIONS:  No  FALLS:  Has patient fallen in last 6 months? No  LIVING ENVIRONMENT: Lives with: lives with their spouse Lives in: House/apartment Stairs: Yes: External: 3 steps; on left going up (does need help with stairs) Has following equipment at home: Single point cane  OCCUPATION:  Retired   PLOF:  Independent and Leisure: household chores, reading, walking  PATIENT GOALS:  Improve pain, be able to return to regular  activities   OBJECTIVE:  Note: Objective measures were completed at Evaluation unless otherwise noted.  DIAGNOSTIC FINDINGS:  MRI Lumbar Spine: Progression of degenerative disc desiccation with mild caudal foraminal narrowing at L3-4 and L4-5. No high-grade foraminal or spinal stenosis is present.   Mild grade 1 anterior spondylolisthesis of L5 on S1 secondary to moderate to severe facet arthrosis. There is crowding of the descending nerve roots in the lateral recess, left greater than right. Correlation for mild left S1 radicular symptoms. 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 Mild hip arthritis.  PATIENT SURVEYS:  Patient-Specific Activity Scoring Scheme  0 represents "unable to perform." 10 represents "able to perform at prior level. 0 1 2 3 4 5 6 7 8 9  10 (Date and Score)   Activity Eval     1. Sitting on Lt side 2    2. Standing 2    3. Lying down 5   Score 3    Total score = sum of the activity scores/number of activities Minimum detectable change (90%CI) for average score = 2 points Minimum detectable change (90%CI) for single activity score = 3 points    COGNITIVE STATUS: Within functional limits for tasks assessed   SENSATION: WFL  POSTURE:  Rt lateral shift in standing   GAIT: 05/23/24 Distance walked: 100' within clinic Assistive device utilized: Single point cane Level of assistance: Modified independence Comments: antalgic gait with decreased stance on LLE   PALPATION: 05/23/24 trigger points and tenderness throughout glutes, hamstring   LUMBAR ROM:  pain all directions, no increase with quadrant testing  ROM A/PROM  eval 06/06/2024 Left/Right  Flexion WNL with pain   Extension WNL with pain   Right quadrant WNL   Left quadrant WNL   Hip flexors  100/100  Hip ER  30/25  Hip IR  10/12  Hamstrings  35/50   (Blank rows = not tested)   LOWER EXTREMITY MMT:    MMT Right eval  Left eval  Hip flexion 3+/5 3+/5  Hip extension  3/5  Hip abduction  3/5  Hip adduction    Hip internal rotation    Hip external rotation    Knee flexion  3+/5  Knee extension     (Blank rows = not tested)    SPECIAL TESTS:  05/23/24 Lumbar Slump test: no  change in pain with testing; increased pain with decreased neural tension  TREATMENT:                                                                                                                              DATE:  06/19/24 Discussed positioning with standing  Lt lateral shift correction 10 x 5 sec hold Lumbar extension 10 x 5 sec hold LLE isometric extension in supine 10 x 5 sec hold Isometric hip abduction in hooklying with strap 10 x 5 sec hold Trial of piriformis stretch (knee to opp shoulder) x30 sec - improved tolerance than figure-4 Bridges with strap 10 x 5 sec hold Pelvic tilt 10 x 5 sec hold Discussed graded exposure to exercise and strength training      06/14/24 TherEx Discussed exercises and pt's response to new exercises.  Advised to hold on new ones as they seem to be leading to increased hip pain - pt verbalized understanding  Manual STM with compression to Lt proximal/lateral hamstring; skilled palpation and monitoring of soft tissue during dry needling  Trigger Point Dry Needling  Initial Treatment: Pt instructed on Dry Needling rational, procedures, and possible side effects. Pt instructed to expect mild to moderate muscle soreness later in the day and/or into the next day.  Pt instructed in methods to reduce muscle soreness. Pt instructed to continue prescribed HEP. Patient was educated on signs and symptoms of infection and other risk factors and advised to seek medical attention should they occur.  Patient verbalized understanding of these instructions and education.   Patient Verbal Consent Given: Yes Education Handout Provided: Yes Muscles Treated: Lt proximal/lateral hamstring Electrical  Stimulation Performed: Yes, Parameters: 10 mA freq with intensity to tolerance x 5 min Treatment Response/Outcome: twitch response with decreased pain following session    06/06/2024 Hamstrings stretch with ankle dorsiflexion and Yoga strap 10 x 15 seconds Left only Figure 4 stretch 5 x 15 seconds each side Yoga Bridge 10 x 5 seconds Lumbar extension AROM (Gentle) 10 x 3 seconds Lateral shift correction 10 x Gentle  97535: Objective assessment of flexibility, review of her current home exercises and slight modifications made to address left hamstrings and bilateral hip external rotation active range of motion impairments   05/29/24 Neuro Re Ed Rt lateral shift correction 10 x 5 sec hold Standing lumbar extension 10x5 sec hold Segmental sciatic nerve glides at hip/knee/ankle 10 x 5 sec hold each   TherEx Isometric hip abduction with belt 10x5 sec hold bil Bridges with belt 10x5 sec hold LLE isometric hip extension 10x5 sec hold Piriformis stretch 3x15 sec hold; knee to opp shoulder   05/23/24 TherEx See HEP - demonstrated with trial reps performed PRN, mod cues for comprehension    Self Care Educated on PT POC and clinical findings, discussed possibility of DN   Manual Tennis ball to Lt hamstrings and glutes/piriformis    PATIENT EDUCATION:  Education details: HEP, DN Person educated:  Patient Education method: Explanation, Demonstration, and Handouts Education comprehension: verbalized understanding, returned demonstration, and needs further education  HOME EXERCISE PROGRAM: Access Code: [email or web address] URL: [email or web address] Date: 06/06/2024 Prepared by: Lamar Ivory  Exercises - Hip Neural Slide/Glide  - 2 x daily - 1 x weekly - 1 sets - 10 reps - 5-10 sec hold - Knee Nerve Slide/Glide  - 2 x daily - 7 x weekly - 1 sets - 10 reps - 15  sec hold - Ankle Slide/Glide  - 2 x daily - 1 x weekly - 1 sets - 10 reps - 5-10 sec hold - Right Standing Lateral  Shift Correction at Wall - Repetitions  - 2-3 x daily - 7 x weekly - 1 sets - 10 reps - 10 sec hold - Standing Lumbar Extension at Wall - Forearms  - 2-3 x daily - 7 x weekly - 1 sets - 10 reps - 10 sec hold - Self Release   - 2-3 x daily - 1 x weekly - 1 sets - 1 reps - 2-3 min hold - IT Band Mobilization with Small Ball  - 2-3 x daily - 1 x weekly - 1 sets - 1 reps - 2-3 min hold - Supine Figure 4 Piriformis Stretch  - 2 x daily - 7 x weekly - 1 sets - 5 reps - 15 seconds hold - Yoga Bridge  - 2 x daily - 7 x weekly - 1 sets - 10 reps - 5 seconds hold  8/20: Holding these exercises for now: - Self Release   - 2-3 x daily - 7 x weekly - 1 sets - 1 reps - 2-3 min hold - IT Band Mobilization with Small Ball  - 2-3 x daily - 7 x weekly - 1 sets - 1 reps - 2-3 min hold   ASSESSMENT:  CLINICAL IMPRESSION: Pt tolerated session well today with conitnued focus on decreasing symptoms as well as initiating some core stability   OBJECTIVE IMPAIRMENTS: Abnormal gait, decreased coordination, decreased mobility, difficulty walking, decreased strength, increased fascial restrictions, increased muscle spasms, impaired flexibility, postural dysfunction, and pain.   ACTIVITY LIMITATIONS: carrying, lifting, bending, sitting, standing, sleeping, stairs, transfers, bed mobility, and locomotion level  PARTICIPATION LIMITATIONS: meal prep, cleaning, laundry, driving, shopping, and community activity  PERSONAL FACTORS: Age, Past/current experiences, Time since onset of injury/illness/exacerbation, and 3+ comorbidities: Hx endometrial cancer, migraines, OSA, venous insufficiency are also affecting patient's functional outcome.   REHAB POTENTIAL: Good  CLINICAL DECISION MAKING: Evolving/moderate complexity  EVALUATION COMPLEXITY: Moderate   GOALS: Goals reviewed with patient? Yes  SHORT TERM GOALS: Target date: 06/13/2024  Independent with initial HEP Goal status: Met 06/06/2024   LONG TERM GOALS:  Target date: 07/04/2024  Independent with final HEP Goal status: INITIAL  2.  PSFS score improved by 3 points Goal status: INITIAL  3.  Lt hip strength improved to 4/5 for improved function Goal status: INIITAL  4.  Report pain < 4/10 with sitting and standing for improved function Goal status: INITIAL  5.  Report ability to perform housework at least 50% capacity for improved function Goal status: INITIAL   PLAN:  PT FREQUENCY: 1-2x/week  PT DURATION: 6 weeks  PLANNED INTERVENTIONS: 97164- PT Re-evaluation, 97750- Physical Performance Testing, 97110-Therapeutic exercises, 97530- Therapeutic activity, [id number]- Neuromuscular re-education, 97535- Self Care, [id number]- Manual therapy, [id number]- Gait training, [phone]- Aquatic Therapy, [phone]- Electrical stimulation (unattended), [phone]- Traction (mechanical), [id number]- Ionotophoresis 4mg /ml Dexamethasone , 79439 (1-2 muscles), 20561 (3+ muscles)- Dry Needling, Patient/Family education, Balance  training, Stair training, Taping, Spinal manipulation, Spinal mobilization, Cryotherapy, and Moist heat.  PLAN FOR NEXT SESSION:  likely DN at pt's request,  Review HEP PRN, assess response to DN, hip strengthening as appropriate, can give SI stab exercises next visit if requested.    NEXT MD VISIT: 06/20/24   Corean JULIANNA Ku, PT, DPT 06/19/24 9:26 AM

## 2024-06-20 ENCOUNTER — Ambulatory Visit: Admitting: Physician Assistant

## 2024-06-20 ENCOUNTER — Other Ambulatory Visit (HOSPITAL_BASED_OUTPATIENT_CLINIC_OR_DEPARTMENT_OTHER): Payer: Self-pay

## 2024-06-20 ENCOUNTER — Encounter: Payer: Self-pay | Admitting: Physician Assistant

## 2024-06-20 DIAGNOSIS — M25552 Pain in left hip: Secondary | ICD-10-CM | POA: Diagnosis not present

## 2024-06-20 MED ORDER — TRAMADOL HCL 50 MG PO TABS
50.0000 mg | ORAL_TABLET | Freq: Four times a day (QID) | ORAL | 0 refills | Status: AC | PRN
  Filled 2024-06-20: qty 30, 8d supply, fill #0

## 2024-06-20 NOTE — Progress Notes (Signed)
 HPI: Mandy Holmes returns today in follow-up of her left hip pain.  She found therapy to be very helpful as far as her strength and mobility in regards to the left hip.  She states that she still has significant pain at 330 to 4 PM daily but is as bad as it ever was.  However she is now able to ambulate.  She states she was able to actually go to dinner for the first time since April as she has had diminished pain and better mobility.  She is asking for something for pain in the evenings.  She has been taking Tylenol  and Celebrex  and Celebrex  really has given her no relief.  She states the morning pain is much better.  She is having no radicular symptoms.  She still points to the lateral aspect of her hip and posterior buttocks region as the source of her pain on the left side.  Review of systems: See HPI otherwise negative  Physical exam: General Well-developed well-nourished female who walks with a nonantalgic gait no assistive device.  Able to get on and off the exam table on her own. Bilateral hips: Good range of motion of both hips without pain.  Tenderness over the left hip trochanteric region.  Impression: Left hip pain  Plan: Recommend she continue to work with therapy on strengthening and also stretching.  She does have a home exercise program and encouraged her to continue to do this.  Will prescribe tramadol  mostly for evening use.  No driving while on narcotics.  She can stop the Celebrex  discussed with her trying either turmeric or dried tart cherries.  She will follow-up with Dr. Vernetta in 6 weeks to see how she is doing overall.  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  Questions were encouraged and answered at length.  Will also send her to Dr. Burnetta for possible left trochanteric high intensity ultrasound.

## 2024-06-20 NOTE — Addendum Note (Signed)
 Addended by: PETER FRIEZE B on: 06/20/2024 04:44 PM   Modules accepted: Orders

## 2024-06-24 ENCOUNTER — Encounter: Payer: Self-pay | Admitting: Physical Therapy

## 2024-06-24 ENCOUNTER — Ambulatory Visit: Admitting: Physical Therapy

## 2024-06-24 DIAGNOSIS — M25552 Pain in left hip: Secondary | ICD-10-CM

## 2024-06-24 DIAGNOSIS — R29898 Other symptoms and signs involving the musculoskeletal system: Secondary | ICD-10-CM

## 2024-06-24 DIAGNOSIS — M79605 Pain in left leg: Secondary | ICD-10-CM

## 2024-06-24 DIAGNOSIS — M6281 Muscle weakness (generalized): Secondary | ICD-10-CM

## 2024-06-24 NOTE — Therapy (Signed)
 OUTPATIENT PHYSICAL THERAPY TREATMENT   Patient Name: Mandy Holmes MRN: [id number] DOB:Feb 08, 1951, 73 y.o., female Today's Date: 06/24/2024  END OF SESSION:  PT End of Session - 06/24/24 0848     Visit Number 6    Number of Visits 12    Date for PT Re-Evaluation 07/04/24    Authorization Type [employer]    Progress Note Due on Visit 10    PT Start Time 0847    PT Stop Time 0920    PT Time Calculation (min) 33 min    Activity Tolerance Patient tolerated treatment well;No increased pain;Patient limited by pain    Behavior During Therapy [facility] for tasks assessed/performed               Past Medical History:  Diagnosis Date   Basal cell carcinoma 07/07/2020   right tips of nose -([facility]) Dr. Karis   Cancer [facility])    endometrial   Constipation    Headache(784.0)    migraines   Hypothyroidism    Migraine    Sleep apnea    CPAP   Venous insufficiency    bilateral lower legs   Past Surgical History:  Procedure Laterality Date   ABDOMINAL HYSTERECTOMY     APPENDECTOMY     KNEE ARTHROSCOPY Left    ROBOTIC ASSISTED TOTAL HYSTERECTOMY WITH BILATERAL SALPINGO OOPHERECTOMY N/A 12/03/2013   Procedure: ROBOTIC ASSISTED TOTAL HYSTERECTOMY WITH BILATERAL SALPINGO OOPHORECTOMY WITH BILATERAL LYMPH NODE DISSECTION;  Surgeon: Elenore A. Dodie, MD;  Location: [facility];  Service: Gynecology;  Laterality: N/A;   TUBAL LIGATION     VENTRAL HERNIA REPAIR N/A 07/29/2021   Procedure: LAPAROSCOPIC VENTRAL HERNIA REPAIR WITH MESH;  Surgeon: Ebbie Cough, MD;  Location: [facility];  Service: General;  Laterality: N/A;   WISDOM TOOTH EXTRACTION     Patient Active Problem List   Diagnosis Date Noted   OSA on CPAP 01/20/2021   Class 1 obesity due to excess calories without serious comorbidity with body mass index (BMI) of 31.0 to 31.9 in adult 01/29/2018   Hypercalcemia 09/15/2017   Elevated blood pressure reading 09/15/2017   Dyspareunia, female 12/22/2015   Preventative  health care 05/19/2015   History of estrogen deficiency 05/19/2015   Chronic fatigue 05/19/2015   Hypothyroidism 05/19/2015   History of uterine cancer 11/07/2013    PCP: Hughie Sharper, MD  REFERRING PROVIDER: Gretta Bertrum ORN, PA-C  REFERRING DIAG: [phone] (ICD-10-CM) - Pain in left hip  Rationale for Evaluation and Treatment: Rehabilitation  THERAPY DIAG:  Pain in left hip  Pain in left leg  Muscle weakness (generalized)  Other symptoms and signs involving the musculoskeletal system  ONSET DATE: 01/13/24   SUBJECTIVE:  SUBJECTIVE STATEMENT: Follow up with Tory was okay, but didn't feel like she got many answers.  Has tramadol  which helped a little   PERTINENT HISTORY:  Hx endometrial cancer, migraines, OSA, venous insufficiency  PAIN:  Are you having pain? Yes: NPRS scale: 3-7/10 (was 7/10 always) Pain location: Lt hip/buttock/hamstrings Pain description: ripping, pulling, sharp Aggravating factors: Prolonged postures including sitting and standing (was unable to stand longer than a few seconds) Relieving factors: injection helped briefly, significant early progress with physical therapy exercises  PRECAUTIONS:  None  RED FLAGS: None   WEIGHT BEARING RESTRICTIONS:  No  FALLS:  Has patient fallen in last 6 months? No  LIVING ENVIRONMENT: Lives with: lives with their spouse Lives in: House/apartment Stairs: Yes: External: 3 steps; on left going up (does need help with stairs) Has following equipment at home: Single point cane  OCCUPATION:  Retired   PLOF:  Independent and Leisure: household chores, reading, walking  PATIENT GOALS:  Improve pain, be able to return to regular activities   OBJECTIVE:  Note: Objective measures were completed at Evaluation unless  otherwise noted.  DIAGNOSTIC FINDINGS:  MRI Lumbar Spine: Progression of degenerative disc desiccation with mild caudal foraminal narrowing at L3-4 and L4-5. No high-grade foraminal or spinal stenosis is present.   Mild grade 1 anterior spondylolisthesis of L5 on S1 secondary to moderate to severe facet arthrosis. There is crowding of the descending nerve roots in the lateral recess, left greater than right. Correlation for mild left S1 radicular symptoms. 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 Mild hip arthritis.  PATIENT SURVEYS:  Patient-Specific Activity Scoring Scheme  0 represents "unable to perform." 10 represents "able to perform at prior level. 0 1 2 3 4 5 6 7 8 9  10 (Date and Score)   Activity Eval     1. Sitting on Lt side 2    2. Standing 2    3. Lying down 5   Score 3    Total score = sum of the activity scores/number of activities Minimum detectable change (90%CI) for average score = 2 points Minimum detectable change (90%CI) for single activity score = 3 points    COGNITIVE STATUS: Within functional limits for tasks assessed   SENSATION: WFL  POSTURE:  Rt lateral shift in standing   GAIT: 05/23/24 Distance walked: 100' within clinic Assistive device utilized: Single point cane Level of assistance: Modified independence Comments: antalgic gait with decreased stance on LLE   PALPATION: 05/23/24 trigger points and tenderness throughout glutes, hamstring   LUMBAR ROM:  pain all directions, no increase with quadrant testing  ROM A/PROM  eval 06/06/2024 Left/Right  Flexion WNL with pain   Extension WNL with pain   Right quadrant WNL   Left quadrant WNL   Hip flexors  100/100  Hip ER  30/25  Hip IR  10/12  Hamstrings  35/50   (Blank rows = not tested)   LOWER EXTREMITY MMT:    MMT Right eval Left eval  Hip flexion 3+/5 3+/5  Hip extension  3/5  Hip abduction  3/5  Hip adduction     Hip internal rotation    Hip external rotation    Knee flexion  3+/5  Knee extension     (Blank rows = not tested)    SPECIAL TESTS:  05/23/24 Lumbar Slump test: no change in pain with testing; increased pain with decreased neural tension  TREATMENT:  DATE:  06/24/24 Manual Lt IT Band rolling x 5 min TPR to lateral hamstring  TherEx Sidelying hip circles 2x10 CW/CCW on Lt Sidelying hip extension 2x10 on Lt Prone hip extension on Lt 2x10 Prone hip extension with knee flexed on Lt 2x10 Standing good mornings 2x10 Sciatic nerve glides 2x10; 3-5 sec hold, Lt Piriformis stretch 3x30 sec hold on Lt   06/19/24 Discussed positioning with standing  Lt lateral shift correction 10 x 5 sec hold Lumbar extension 10 x 5 sec hold LLE isometric extension in supine 10 x 5 sec hold Isometric hip abduction in hooklying with strap 10 x 5 sec hold Trial of piriformis stretch (knee to opp shoulder) x30 sec - improved tolerance than figure-4 Bridges with strap 10 x 5 sec hold Pelvic tilt 10 x 5 sec hold Discussed graded exposure to exercise and strength training      06/14/24 TherEx Discussed exercises and pt's response to new exercises.  Advised to hold on new ones as they seem to be leading to increased hip pain - pt verbalized understanding  Manual STM with compression to Lt proximal/lateral hamstring; skilled palpation and monitoring of soft tissue during dry needling  Trigger Point Dry Needling  Initial Treatment: Pt instructed on Dry Needling rational, procedures, and possible side effects. Pt instructed to expect mild to moderate muscle soreness later in the day and/or into the next day.  Pt instructed in methods to reduce muscle soreness. Pt instructed to continue prescribed HEP. Patient was educated on signs and symptoms of infection and other risk factors  and advised to seek medical attention should they occur.  Patient verbalized understanding of these instructions and education.   Patient Verbal Consent Given: Yes Education Handout Provided: Yes Muscles Treated: Lt proximal/lateral hamstring Electrical Stimulation Performed: Yes, Parameters: 10 mA freq with intensity to tolerance x 5 min Treatment Response/Outcome: twitch response with decreased pain following session    06/06/2024 Hamstrings stretch with ankle dorsiflexion and Yoga strap 10 x 15 seconds Left only Figure 4 stretch 5 x 15 seconds each side Yoga Bridge 10 x 5 seconds Lumbar extension AROM (Gentle) 10 x 3 seconds Lateral shift correction 10 x Gentle  [id number]: Objective assessment of flexibility, review of her current home exercises and slight modifications made to address left hamstrings and bilateral hip external rotation active range of motion impairments   PATIENT EDUCATION:  Education details: HEP, DN Person educated: Patient Education method: Explanation, Demonstration, and Handouts Education comprehension: verbalized understanding, returned demonstration, and needs further education  HOME EXERCISE PROGRAM: Access Code: [email or web address] URL: [email or web address] Date: 06/06/2024 Prepared by: Lamar Ivory  Exercises - Hip Neural Slide/Glide  - 2 x daily - 1 x weekly - 1 sets - 10 reps - 5-10 sec hold - Knee Nerve Slide/Glide  - 2 x daily - 7 x weekly - 1 sets - 10 reps - 15  sec hold - Ankle Slide/Glide  - 2 x daily - 1 x weekly - 1 sets - 10 reps - 5-10 sec hold - Right Standing Lateral Shift Correction at Wall - Repetitions  - 2-3 x daily - 7 x weekly - 1 sets - 10 reps - 10 sec hold - Standing Lumbar Extension at Wall - Forearms  - 2-3 x daily - 7 x weekly - 1 sets - 10 reps - 10 sec hold - Self Release   - 2-3 x daily - 1 x weekly - 1 sets - 1 reps - 2-3 min hold - IT Band  Mobilization with Small Ball  - 2-3 x daily - 1 x weekly - 1 sets - 1 reps -  2-3 min hold - Supine Figure 4 Piriformis Stretch  - 2 x daily - 7 x weekly - 1 sets - 5 reps - 15 seconds hold - Yoga Bridge  - 2 x daily - 7 x weekly - 1 sets - 10 reps - 5 seconds hold  8/20: Holding these exercises for now: - Self Release   - 2-3 x daily - 7 x weekly - 1 sets - 1 reps - 2-3 min hold - IT Band Mobilization with Small Ball  - 2-3 x daily - 7 x weekly - 1 sets - 1 reps - 2-3 min hold   ASSESSMENT:  CLINICAL IMPRESSION: Pt with continued pain and posterior upper leg but does report it's slowly improving over time.  Continue skilled PT.    OBJECTIVE IMPAIRMENTS: Abnormal gait, decreased coordination, decreased mobility, difficulty walking, decreased strength, increased fascial restrictions, increased muscle spasms, impaired flexibility, postural dysfunction, and pain.   ACTIVITY LIMITATIONS: carrying, lifting, bending, sitting, standing, sleeping, stairs, transfers, bed mobility, and locomotion level  PARTICIPATION LIMITATIONS: meal prep, cleaning, laundry, driving, shopping, and community activity  PERSONAL FACTORS: Age, Past/current experiences, Time since onset of injury/illness/exacerbation, and 3+ comorbidities: Hx endometrial cancer, migraines, OSA, venous insufficiency are also affecting patient's functional outcome.   REHAB POTENTIAL: Good  CLINICAL DECISION MAKING: Evolving/moderate complexity  EVALUATION COMPLEXITY: Moderate   GOALS: Goals reviewed with patient? Yes  SHORT TERM GOALS: Target date: 06/13/2024  Independent with initial HEP Goal status: Met 06/06/2024   LONG TERM GOALS: Target date: 07/04/2024  Independent with final HEP Goal status: INITIAL  2.  PSFS score improved by 3 points Goal status: INITIAL  3.  Lt hip strength improved to 4/5 for improved function Goal status: INIITAL  4.  Report pain < 4/10 with sitting and standing for improved function Goal status: INITIAL  5.  Report ability to perform housework at least 50%  capacity for improved function Goal status: INITIAL   PLAN:  PT FREQUENCY: 1-2x/week  PT DURATION: 6 weeks  PLANNED INTERVENTIONS: [id number]- PT Re-evaluation, 97750- Physical Performance Testing, 97110-Therapeutic exercises, 97530- Therapeutic activity, 97112- Neuromuscular re-education, 97535- Self Care, [id number]- Manual therapy, [phone]- Gait training, [phone]- Aquatic Therapy, [phone]- Electrical stimulation (unattended), [phone]- Traction (mechanical), [id number]- Ionotophoresis 4mg /ml Dexamethasone , 79439 (1-2 muscles), [id number] (3+ muscles)- Dry Needling, Patient/Family education, Balance training, Stair training, Taping, Spinal manipulation, Spinal mobilization, Cryotherapy, and Moist heat.  PLAN FOR NEXT SESSION: hamstring/core exercises,  likely DN at pt's request,  Review HEP PRN, assess response to DN, hip strengthening as appropriate  NEXT MD VISIT: 06/20/24   Corean JULIANNA Ku, PT, DPT 06/24/24 9:23 AM

## 2024-07-01 ENCOUNTER — Ambulatory Visit: Admitting: Physical Therapy

## 2024-07-01 ENCOUNTER — Encounter: Payer: Self-pay | Admitting: Physical Therapy

## 2024-07-01 DIAGNOSIS — M79605 Pain in left leg: Secondary | ICD-10-CM | POA: Diagnosis not present

## 2024-07-01 DIAGNOSIS — R29898 Other symptoms and signs involving the musculoskeletal system: Secondary | ICD-10-CM | POA: Diagnosis not present

## 2024-07-01 DIAGNOSIS — M25552 Pain in left hip: Secondary | ICD-10-CM | POA: Diagnosis not present

## 2024-07-01 DIAGNOSIS — M6281 Muscle weakness (generalized): Secondary | ICD-10-CM

## 2024-07-01 NOTE — Therapy (Signed)
 OUTPATIENT PHYSICAL THERAPY TREATMENT   Patient Name: Mandy Holmes MRN: [id number] DOB:1951-01-21, 73 y.o., female Today's Date: 07/01/2024  END OF SESSION:  PT End of Session - 07/01/24 0850     Visit Number 7    Number of Visits 12    Date for Recertification  07/04/24    Authorization Type [employer]    Progress Note Due on Visit 10    PT Start Time 0846    PT Stop Time 0924    PT Time Calculation (min) 38 min    Activity Tolerance Patient tolerated treatment well;No increased pain;Patient limited by pain    Behavior During Therapy [facility] for tasks assessed/performed                Past Medical History:  Diagnosis Date   Basal cell carcinoma 07/07/2020   right tips of nose -([facility]) Dr. Karis   Cancer [facility])    endometrial   Constipation    Headache(784.0)    migraines   Hypothyroidism    Migraine    Sleep apnea    CPAP   Venous insufficiency    bilateral lower legs   Past Surgical History:  Procedure Laterality Date   ABDOMINAL HYSTERECTOMY     APPENDECTOMY     KNEE ARTHROSCOPY Left    ROBOTIC ASSISTED TOTAL HYSTERECTOMY WITH BILATERAL SALPINGO OOPHERECTOMY N/A 12/03/2013   Procedure: ROBOTIC ASSISTED TOTAL HYSTERECTOMY WITH BILATERAL SALPINGO OOPHORECTOMY WITH BILATERAL LYMPH NODE DISSECTION;  Surgeon: Elenore A. Dodie, MD;  Location: [facility];  Service: Gynecology;  Laterality: N/A;   TUBAL LIGATION     VENTRAL HERNIA REPAIR N/A 07/29/2021   Procedure: LAPAROSCOPIC VENTRAL HERNIA REPAIR WITH MESH;  Surgeon: Ebbie Cough, MD;  Location: [facility];  Service: General;  Laterality: N/A;   WISDOM TOOTH EXTRACTION     Patient Active Problem List   Diagnosis Date Noted   OSA on CPAP 01/20/2021   Class 1 obesity due to excess calories without serious comorbidity with body mass index (BMI) of 31.0 to 31.9 in adult 01/29/2018   Hypercalcemia 09/15/2017   Elevated blood pressure reading 09/15/2017   Dyspareunia, female 12/22/2015   Preventative  health care 05/19/2015   History of estrogen deficiency 05/19/2015   Chronic fatigue 05/19/2015   Hypothyroidism 05/19/2015   History of uterine cancer 11/07/2013    PCP: Hughie Sharper, MD  REFERRING PROVIDER: Gretta Bertrum ORN, PA-C  REFERRING DIAG: [phone] (ICD-10-CM) - Pain in left hip  Rationale for Evaluation and Treatment: Rehabilitation  THERAPY DIAG:  Pain in left leg  Pain in left hip  Muscle weakness (generalized)  Other symptoms and signs involving the musculoskeletal system  ONSET DATE: 01/13/24   SUBJECTIVE:  SUBJECTIVE STATEMENT: Didn't do many exercises this weekend; helped daughter move back home this weekend   PERTINENT HISTORY:  Hx endometrial cancer, migraines, OSA, venous insufficiency  PAIN:  Are you having pain? Yes: NPRS scale: 3-7/10 (was 7/10 always) Pain location: Lt hip/buttock/hamstrings Pain description: ripping, pulling, sharp Aggravating factors: Prolonged postures including sitting and standing (was unable to stand longer than a few seconds) Relieving factors: injection helped briefly, significant early progress with physical therapy exercises  PRECAUTIONS:  None  RED FLAGS: None   WEIGHT BEARING RESTRICTIONS:  No  FALLS:  Has patient fallen in last 6 months? No  LIVING ENVIRONMENT: Lives with: lives with their spouse Lives in: House/apartment Stairs: Yes: External: 3 steps; on left going up (does need help with stairs) Has following equipment at home: Single point cane  OCCUPATION:  Retired   PLOF:  Independent and Leisure: household chores, reading, walking  PATIENT GOALS:  Improve pain, be able to return to regular activities   OBJECTIVE:  Note: Objective measures were completed at Evaluation unless otherwise noted.  DIAGNOSTIC  FINDINGS:  MRI Lumbar Spine: Progression of degenerative disc desiccation with mild caudal foraminal narrowing at L3-4 and L4-5. No high-grade foraminal or spinal stenosis is present.   Mild grade 1 anterior spondylolisthesis of L5 on S1 secondary to moderate to severe facet arthrosis. There is crowding of the descending nerve roots in the lateral recess, left greater than right. Correlation for mild left S1 radicular symptoms. 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 Mild hip arthritis.  PATIENT SURVEYS:  Patient-Specific Activity Scoring Scheme  0 represents "unable to perform." 10 represents "able to perform at prior level. 0 1 2 3 4 5 6 7 8 9  10 (Date and Score)   Activity Eval  07/01/24   1. Sitting on Lt side 2 2   2. Standing 2 2  3. Lying down 5 2  Score 3 2   Total score = sum of the activity scores/number of activities Minimum detectable change (90%CI) for average score = 2 points Minimum detectable change (90%CI) for single activity score = 3 points    COGNITIVE STATUS: Within functional limits for tasks assessed   SENSATION: WFL  POSTURE:  Rt lateral shift in standing   GAIT: 05/23/24 Distance walked: 100' within clinic Assistive device utilized: Single point cane Level of assistance: Modified independence Comments: antalgic gait with decreased stance on LLE   PALPATION: 05/23/24 trigger points and tenderness throughout glutes, hamstring   LUMBAR ROM:  pain all directions, no increase with quadrant testing  ROM A/PROM  eval 06/06/2024 Left/Right  Flexion WNL with pain   Extension WNL with pain   Right quadrant WNL   Left quadrant WNL   Hip flexors  100/100  Hip ER  30/25  Hip IR  10/12  Hamstrings  35/50   (Blank rows = not tested)   LOWER EXTREMITY MMT:    MMT Right eval Left eval  Hip flexion 3+/5 3+/5  Hip extension  3/5  Hip abduction  3/5  Hip adduction    Hip internal rotation     Hip external rotation    Knee flexion  3+/5  Knee extension     (Blank rows = not tested)    SPECIAL TESTS:  05/23/24 Lumbar Slump test: no change in pain with testing; increased pain with decreased neural tension  TREATMENT:  DATE:  07/01/24 Manual Lt IT Band rolling x 5 min; then IASTM with percussive device x 10 min to lateral quad, lateral/median hamstrings and glutes  TherEx Sciatic nerve glides x10; 5-10 sec hold, Lt Isometric Lt hip ext in 90 deg hip flex x10; 5 sec hold Isometric Lt hip ext with leg extended x10; 5 sec hold   TherAct Bridges x 10 reps; 5 sec hold Good mornings x 10 reps; followed by RDL 10# x10 reps Squats x10 with 10# KB    06/24/24 Manual Lt IT Band rolling x 5 min TPR to lateral hamstring  TherEx Sidelying hip circles 2x10 CW/CCW on Lt Sidelying hip extension 2x10 on Lt Prone hip extension on Lt 2x10 Prone hip extension with knee flexed on Lt 2x10 Standing good mornings 2x10 Sciatic nerve glides 2x10; 3-5 sec hold, Lt Piriformis stretch 3x30 sec hold on Lt   06/19/24 Discussed positioning with standing  Lt lateral shift correction 10 x 5 sec hold Lumbar extension 10 x 5 sec hold LLE isometric extension in supine 10 x 5 sec hold Isometric hip abduction in hooklying with strap 10 x 5 sec hold Trial of piriformis stretch (knee to opp shoulder) x30 sec - improved tolerance than figure-4 Bridges with strap 10 x 5 sec hold Pelvic tilt 10 x 5 sec hold Discussed graded exposure to exercise and strength training      06/14/24 TherEx Discussed exercises and pt's response to new exercises.  Advised to hold on new ones as they seem to be leading to increased hip pain - pt verbalized understanding  Manual STM with compression to Lt proximal/lateral hamstring; skilled palpation and monitoring of soft tissue during dry  needling  Trigger Point Dry Needling  Initial Treatment: Pt instructed on Dry Needling rational, procedures, and possible side effects. Pt instructed to expect mild to moderate muscle soreness later in the day and/or into the next day.  Pt instructed in methods to reduce muscle soreness. Pt instructed to continue prescribed HEP. Patient was educated on signs and symptoms of infection and other risk factors and advised to seek medical attention should they occur.  Patient verbalized understanding of these instructions and education.   Patient Verbal Consent Given: Yes Education Handout Provided: Yes Muscles Treated: Lt proximal/lateral hamstring Electrical Stimulation Performed: Yes, Parameters: 10 mA freq with intensity to tolerance x 5 min Treatment Response/Outcome: twitch response with decreased pain following session    06/06/2024 Hamstrings stretch with ankle dorsiflexion and Yoga strap 10 x 15 seconds Left only Figure 4 stretch 5 x 15 seconds each side Yoga Bridge 10 x 5 seconds Lumbar extension AROM (Gentle) 10 x 3 seconds Lateral shift correction 10 x Gentle  97535: Objective assessment of flexibility, review of her current home exercises and slight modifications made to address left hamstrings and bilateral hip external rotation active range of motion impairments   PATIENT EDUCATION:  Education details: HEP, DN Person educated: Patient Education method: Explanation, Demonstration, and Handouts Education comprehension: verbalized understanding, returned demonstration, and needs further education  HOME EXERCISE PROGRAM: Access Code: [email or web address] URL: [email or web address] Date: 06/06/2024 Prepared by: Lamar Ivory  Exercises - Hip Neural Slide/Glide  - 2 x daily - 1 x weekly - 1 sets - 10 reps - 5-10 sec hold - Knee Nerve Slide/Glide  - 2 x daily - 7 x weekly - 1 sets - 10 reps - 15  sec hold - Ankle Slide/Glide  - 2 x daily - 1 x weekly - 1 sets - 10  reps  - 5-10 sec hold - Right Standing Lateral Shift Correction at Wall - Repetitions  - 2-3 x daily - 7 x weekly - 1 sets - 10 reps - 10 sec hold - Standing Lumbar Extension at Wall - Forearms  - 2-3 x daily - 7 x weekly - 1 sets - 10 reps - 10 sec hold - Self Release   - 2-3 x daily - 1 x weekly - 1 sets - 1 reps - 2-3 min hold - IT Band Mobilization with Small Ball  - 2-3 x daily - 1 x weekly - 1 sets - 1 reps - 2-3 min hold - Supine Figure 4 Piriformis Stretch  - 2 x daily - 7 x weekly - 1 sets - 5 reps - 15 seconds hold - Yoga Bridge  - 2 x daily - 7 x weekly - 1 sets - 10 reps - 5 seconds hold  8/20: Holding these exercises for now: - Self Release   - 2-3 x daily - 7 x weekly - 1 sets - 1 reps - 2-3 min hold - IT Band Mobilization with Small Ball  - 2-3 x daily - 7 x weekly - 1 sets - 1 reps - 2-3 min hold   ASSESSMENT:  CLINICAL IMPRESSION: Increased pain reported today as she was unable to do exercises over the weekend due to some changes in her family dynamics.  At this time will continue to benefit from PT to maximize function.   OBJECTIVE IMPAIRMENTS: Abnormal gait, decreased coordination, decreased mobility, difficulty walking, decreased strength, increased fascial restrictions, increased muscle spasms, impaired flexibility, postural dysfunction, and pain.   ACTIVITY LIMITATIONS: carrying, lifting, bending, sitting, standing, sleeping, stairs, transfers, bed mobility, and locomotion level  PARTICIPATION LIMITATIONS: meal prep, cleaning, laundry, driving, shopping, and community activity  PERSONAL FACTORS: Age, Past/current experiences, Time since onset of injury/illness/exacerbation, and 3+ comorbidities: Hx endometrial cancer, migraines, OSA, venous insufficiency are also affecting patient's functional outcome.   REHAB POTENTIAL: Good  CLINICAL DECISION MAKING: Evolving/moderate complexity  EVALUATION COMPLEXITY: Moderate   GOALS: Goals reviewed with patient? Yes  SHORT  TERM GOALS: Target date: 06/13/2024  Independent with initial HEP Goal status: Met 06/06/2024   LONG TERM GOALS: Target date: 07/04/2024  Independent with final HEP Goal status: INITIAL  2.  PSFS score improved by 3 points Goal status: INITIAL  3.  Lt hip strength improved to 4/5 for improved function Goal status: INIITAL  4.  Report pain < 4/10 with sitting and standing for improved function Goal status: INITIAL  5.  Report ability to perform housework at least 50% capacity for improved function Goal status: INITIAL   PLAN:  PT FREQUENCY: 1-2x/week  PT DURATION: 6 weeks  PLANNED INTERVENTIONS: 97164- PT Re-evaluation, 97750- Physical Performance Testing, 97110-Therapeutic exercises, 97530- Therapeutic activity, 97112- Neuromuscular re-education, 97535- Self Care, 02859- Manual therapy, [phone]- Gait training, [phone]- Aquatic Therapy, [phone]- Electrical stimulation (unattended), [phone]- Traction (mechanical), [id number]- Ionotophoresis 4mg /ml Dexamethasone , 79439 (1-2 muscles), 20561 (3+ muscles)- Dry Needling, Patient/Family education, Balance training, Stair training, Taping, Spinal manipulation, Spinal mobilization, Cryotherapy, and Moist heat.  PLAN FOR NEXT SESSION: will need recert,  hamstring/core exercises,  likely DN at pt's request,  Review HEP PRN, assess response to DN, hip strengthening as appropriate  NEXT MD VISIT: 07/09/24 (Dr. Burnetta)   Corean JULIANNA Ku, PT, DPT 07/01/24 9:25 AM

## 2024-07-08 ENCOUNTER — Encounter: Admitting: Physical Therapy

## 2024-07-08 ENCOUNTER — Other Ambulatory Visit (HOSPITAL_BASED_OUTPATIENT_CLINIC_OR_DEPARTMENT_OTHER): Payer: Self-pay

## 2024-07-08 MED ORDER — HYDROCODONE-ACETAMINOPHEN 5-325 MG PO TABS
1.0000 | ORAL_TABLET | ORAL | 0 refills | Status: DC | PRN
  Filled 2024-07-08: qty 60, 10d supply, fill #0

## 2024-07-09 ENCOUNTER — Encounter: Admitting: Sports Medicine

## 2024-07-15 ENCOUNTER — Other Ambulatory Visit (HOSPITAL_BASED_OUTPATIENT_CLINIC_OR_DEPARTMENT_OTHER): Payer: Self-pay

## 2024-07-15 DIAGNOSIS — D1724 Benign lipomatous neoplasm of skin and subcutaneous tissue of left leg: Secondary | ICD-10-CM | POA: Diagnosis not present

## 2024-07-15 DIAGNOSIS — R29898 Other symptoms and signs involving the musculoskeletal system: Secondary | ICD-10-CM | POA: Diagnosis not present

## 2024-07-15 DIAGNOSIS — F419 Anxiety disorder, unspecified: Secondary | ICD-10-CM | POA: Diagnosis not present

## 2024-07-15 DIAGNOSIS — M79605 Pain in left leg: Secondary | ICD-10-CM | POA: Diagnosis not present

## 2024-07-15 MED ORDER — LORAZEPAM 0.5 MG PO TABS
0.5000 mg | ORAL_TABLET | Freq: Four times a day (QID) | ORAL | 0 refills | Status: AC | PRN
  Filled 2024-07-15: qty 3, 1d supply, fill #0

## 2024-07-16 ENCOUNTER — Encounter: Admitting: Physical Therapy

## 2024-07-22 ENCOUNTER — Encounter: Admitting: Physical Therapy

## 2024-07-22 ENCOUNTER — Other Ambulatory Visit (HOSPITAL_BASED_OUTPATIENT_CLINIC_OR_DEPARTMENT_OTHER): Payer: Self-pay

## 2024-07-22 MED ORDER — HYDROCODONE-ACETAMINOPHEN 5-325 MG PO TABS
1.0000 | ORAL_TABLET | ORAL | 0 refills | Status: AC | PRN
  Filled 2024-07-22: qty 60, 10d supply, fill #0

## 2024-07-22 MED ORDER — METHOCARBAMOL 500 MG PO TABS
500.0000 mg | ORAL_TABLET | Freq: Four times a day (QID) | ORAL | 6 refills | Status: AC | PRN
  Filled 2024-07-22: qty 120, 15d supply, fill #0
  Filled 2024-08-01 – 2024-08-03 (×2): qty 120, 15d supply, fill #1
  Filled 2024-08-22: qty 120, 15d supply, fill #2
  Filled 2024-10-01: qty 120, 15d supply, fill #3

## 2024-07-25 DIAGNOSIS — K5730 Diverticulosis of large intestine without perforation or abscess without bleeding: Secondary | ICD-10-CM | POA: Diagnosis not present

## 2024-07-25 DIAGNOSIS — S76312A Strain of muscle, fascia and tendon of the posterior muscle group at thigh level, left thigh, initial encounter: Secondary | ICD-10-CM | POA: Diagnosis not present

## 2024-07-25 DIAGNOSIS — M7062 Trochanteric bursitis, left hip: Secondary | ICD-10-CM | POA: Diagnosis not present

## 2024-07-25 DIAGNOSIS — D1724 Benign lipomatous neoplasm of skin and subcutaneous tissue of left leg: Secondary | ICD-10-CM | POA: Diagnosis not present

## 2024-07-25 DIAGNOSIS — M1712 Unilateral primary osteoarthritis, left knee: Secondary | ICD-10-CM | POA: Diagnosis not present

## 2024-07-25 DIAGNOSIS — M1612 Unilateral primary osteoarthritis, left hip: Secondary | ICD-10-CM | POA: Diagnosis not present

## 2024-07-25 DIAGNOSIS — Z90710 Acquired absence of both cervix and uterus: Secondary | ICD-10-CM | POA: Diagnosis not present

## 2024-07-30 ENCOUNTER — Encounter: Admitting: Physical Therapy

## 2024-07-30 DIAGNOSIS — R29898 Other symptoms and signs involving the musculoskeletal system: Secondary | ICD-10-CM | POA: Diagnosis not present

## 2024-07-30 DIAGNOSIS — S76892A Other injury of other specified muscles, fascia and tendons at thigh level, left thigh, initial encounter: Secondary | ICD-10-CM | POA: Diagnosis not present

## 2024-07-30 DIAGNOSIS — D1724 Benign lipomatous neoplasm of skin and subcutaneous tissue of left leg: Secondary | ICD-10-CM | POA: Diagnosis not present

## 2024-08-01 ENCOUNTER — Ambulatory Visit: Admitting: Orthopaedic Surgery

## 2024-08-02 ENCOUNTER — Other Ambulatory Visit (HOSPITAL_BASED_OUTPATIENT_CLINIC_OR_DEPARTMENT_OTHER): Payer: Self-pay

## 2024-08-05 ENCOUNTER — Encounter: Admitting: Physical Therapy

## 2024-08-08 DIAGNOSIS — S76302D Unspecified injury of muscle, fascia and tendon of the posterior muscle group at thigh level, left thigh, subsequent encounter: Secondary | ICD-10-CM | POA: Diagnosis not present

## 2024-08-12 ENCOUNTER — Encounter: Payer: Self-pay | Admitting: Radiology

## 2024-08-15 DIAGNOSIS — S76302S Unspecified injury of muscle, fascia and tendon of the posterior muscle group at thigh level, left thigh, sequela: Secondary | ICD-10-CM | POA: Diagnosis not present

## 2024-08-15 DIAGNOSIS — E039 Hypothyroidism, unspecified: Secondary | ICD-10-CM | POA: Diagnosis not present

## 2024-08-15 DIAGNOSIS — G4733 Obstructive sleep apnea (adult) (pediatric): Secondary | ICD-10-CM | POA: Diagnosis not present

## 2024-08-15 DIAGNOSIS — R030 Elevated blood-pressure reading, without diagnosis of hypertension: Secondary | ICD-10-CM | POA: Diagnosis not present

## 2024-09-04 DIAGNOSIS — G4733 Obstructive sleep apnea (adult) (pediatric): Secondary | ICD-10-CM | POA: Diagnosis not present

## 2024-09-04 DIAGNOSIS — S76302A Unspecified injury of muscle, fascia and tendon of the posterior muscle group at thigh level, left thigh, initial encounter: Secondary | ICD-10-CM | POA: Diagnosis not present

## 2024-09-04 DIAGNOSIS — E039 Hypothyroidism, unspecified: Secondary | ICD-10-CM | POA: Diagnosis not present

## 2024-09-04 DIAGNOSIS — S76312S Strain of muscle, fascia and tendon of the posterior muscle group at thigh level, left thigh, sequela: Secondary | ICD-10-CM | POA: Diagnosis not present
